# Patient Record
Sex: Female | Born: 1990 | Race: Black or African American | Hispanic: No | Marital: Single | Smoking: Former smoker
Health system: Southern US, Community
[De-identification: ages and names within clinical notes are randomized; demographics above are authoritative.]

## PROBLEM LIST (undated history)

## (undated) DIAGNOSIS — L309 Dermatitis, unspecified: Secondary | ICD-10-CM

## (undated) DIAGNOSIS — N200 Calculus of kidney: Secondary | ICD-10-CM

## (undated) DIAGNOSIS — T7840XA Allergy, unspecified, initial encounter: Secondary | ICD-10-CM

## (undated) DIAGNOSIS — B019 Varicella without complication: Secondary | ICD-10-CM

## (undated) HISTORY — DX: Calculus of kidney: N20.0

## (undated) HISTORY — DX: Dermatitis, unspecified: L30.9

## (undated) HISTORY — DX: Allergy, unspecified, initial encounter: T78.40XA

## (undated) HISTORY — DX: Varicella without complication: B01.9

## (undated) HISTORY — PX: OTHER SURGICAL HISTORY: SHX169

---
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
## 2010-03-24 ENCOUNTER — Ambulatory Visit: Payer: Self-pay | Admitting: Internal Medicine

## 2010-05-24 ENCOUNTER — Telehealth: Payer: Self-pay | Admitting: Internal Medicine

## 2010-06-26 ENCOUNTER — Telehealth (INDEPENDENT_AMBULATORY_CARE_PROVIDER_SITE_OTHER): Payer: Self-pay | Admitting: *Deleted

## 2010-06-27 ENCOUNTER — Telehealth: Payer: Self-pay | Admitting: Internal Medicine

## 2010-06-29 ENCOUNTER — Ambulatory Visit: Payer: Self-pay | Admitting: Internal Medicine

## 2010-09-27 ENCOUNTER — Ambulatory Visit
Admission: RE | Admit: 2010-09-27 | Discharge: 2010-09-27 | Payer: Self-pay | Source: Home / Self Care | Attending: Internal Medicine | Admitting: Internal Medicine

## 2010-09-27 ENCOUNTER — Encounter: Payer: Self-pay | Admitting: Internal Medicine

## 2010-09-29 NOTE — Assessment & Plan Note (Signed)
Summary: follow-up on prednisone//jrc   Primary Provider/Referring Provider:  [facility]/ [facility]  CC:  Recent Asthma attack-currently on Prednisone. Has had 6 attacks in the past week.Marland Kitchen  History of Present Illness: September 24, 2009 Eczema, asthma, allergic rhinitis....................................Marland Kitchenmother here In November had home CPR then admitted overnight with her asthma. Says today she is "fine" and hasn't missed much school this year with asthma. No flu shot. She recognizes some nasal stuffiness- like this a lot without any sense that she is catching a cold. Has no Epipen now. Using Qvar 80 one puff daily, nebulizer about 4 x/ week, rescue inhaler at least once or twice daily. last prednisone was at hosp in November.  March 24, 2010- Eczema, asthma, allergic rhinitis...................................sister here with baby Wheezes every day. Had a flare with a URI that moved into her chest last week. She stayed at home and toughed that out. She is back to baseline now. Daily use of her rescue inhaler. No recent prednisone. Eczema has been better controlled, using Clobetasol sparingly when needed. Not much sneeze or nasal congestion recently. Skin is doing better than usual for this time of year. We had stopped her allergy shots. She used Epipen only once long ago. IgE- 1669.8  June 29, 2010  Eczema, asthma, allergic rhinitis.. Nurse-CC: Recent Asthma attack-currently on Prednisone. Has had "6 attacks" in the past week. More frequent asthma flares over past month. Weather change and around sister's small child in day care, so may have caught a cold.  Today feels fine, having started prednisone taper 2 days ago- first prednisone this year. Denies  reflux problems. Prednisone also cleared her nasal congestion and drainage.  Now on prednisone feels this flare is coming under control. Eczema control much better in recent years. Discussed Xolair as an option- IgE level high. Discussed flu and  pneumovax.   Asthma History    Asthma Control Assessment:    Age range: 12+ years    Symptoms: >2 days/week    Nighttime Awakenings: 0-2/month    Interferes w/ normal activity: some limitations    SABA use (not for EIB): several times per day    Asthma Control Assessment: Very Poorly Controlled   Preventive Screening-Counseling & Management  Alcohol-Tobacco     Smoking Status: never  Current Medications (verified): 1)  Clobetasol Propionate 0.05 % Oint (Clobetasol Propionate) .... Thin Layer To Affected Area Two Times A Day 2)  Proventil Hfa 108 (90 Base) Mcg/act Aers (Albuterol Sulfate) .... Inhale 2 Puffs Every Four Hours As Needed 3)  Singulair 10 Mg Tabs (Montelukast Sodium) .Marland Kitchen.. 1 Daily 4)  Benadryl 25 Mg  Caps (Diphenhydramine Hcl) .... Take 1 By Mouth Once Daily As Needed 5)  Clarinex-D 12 Hour 2.5-120 Mg  Tb12 (Desloratadine-Pseudoephedrine) .... Take 1 By Mouth Once Daily As Needed 6)  Albuterol Sulfate (5 Mg/ml) 0.5%  Nebu (Albuterol Sulfate) .... Use Via Nebulizer As Directed 7)  Epipen 0.3 Mg/0.80ml (1:1000)  Devi (Epinephrine Hcl (Anaphylaxis)) .... Use As Directed When Needed 8)  Qvar 80 Mcg/act Aers (Beclomethasone Dipropionate) .Marland Kitchen.. 1 Puff Two Times A Day 9)  Flonase 50 Mcg/act Susp (Fluticasone Propionate) .... 2 Puffs Each Nostril Once Daily 10)  Prednisone 10 Mg Tabs (Prednisone) .... Take 2 Tabs Once Daily X 3 Days, 1 Tab Once Daily X 3 Days  Allergies (verified): 1)  ! Pcn 2)  * Peanut/tree Nut, Lima Bean  Past History:  Past Medical History: Last updated: 09/24/2009 Allergic Rhinitis Asthma- hosp 06/2009 eczema Kidney Stone -  right, ER 09/17/09  Past Surgical History: Last updated: 09/24/2008 dental extractions  Family History: Last updated: 09/24/2008 Mother - eczema and asthma Uncle- eczema and asthma  Social History: Last updated: 09/24/2008 2010- 12th grade single lives with mother  Risk Factors: Smoking Status: never  (06/29/2010)  Review of Systems      See HPI       The patient complains of shortness of breath with activity, non-productive cough, nasal congestion/difficulty breathing through nose, and rash.  The patient denies shortness of breath at rest, coughing up blood, chest pain, irregular heartbeats, acid heartburn, indigestion, loss of appetite, weight change, abdominal pain, difficulty swallowing, sore throat, tooth/dental problems, headaches, sneezing, itching, hand/feet swelling, change in color of mucus, and fever.    Vital Signs:  Patient profile:   20 year old female Height:      69 inches Weight:      219.25 pounds BMI:     32.49 O2 Sat:      96 % on Room air Pulse rate:   95 / minute BP sitting:   128 / 76  (left arm) Cuff size:   regular  Vitals Entered By: Reynaldo Minium CMA (June 29, 2010 2:11 PM)  O2 Flow:  Room air CC: Recent Asthma attack-currently on Prednisone. Has had 6 attacks in the past week.   Physical Exam  Additional Exam:  General: A/Ox3; pleasant and cooperative, NAD, AAF. Has gained some weight. SKIN: Old eczematoid changes on hands, less on face. Less inflammation than in past. NODES: no lymphadenopathy HEENT: [place]/AT, EOM- WNL, Conjuctivae- clear, PERRLA, TM-WNL, Nose- turbinate edema and mucus. No polyps, Sniffing., Throat- clear and wnl, Mallampati  III, tonsils NECK: Supple w/ fair ROM, JVD- none, normal carotid impulses w/o bruits Thyroid-  CHEST: Clear to P&A, very clear today HEART: RRR, no m/g/r heard ABDOMEN: Soft and nl;  URK:YHCW, nl pulses, no edema  NEURO: Grossly intact to observation      Impression & Recommendations:  Problem # 1:  ASTHMA (ICD-493.90) First flare needing prednisone this year. She was out of IgE range for Sharion Settler, but I will recheck that later. We discussed and will give flu and pneumovax. She is anticipating allergy testing in January.  Problem # 2:  ECZEMA (ICD-692.9)  Better control with residual scarring Her  updated medication list for this problem includes:    Clobetasol Propionate 0.05 % Oint (Clobetasol propionate) .Marland Kitchen... Thin layer to affected area two times a day    Benadryl 25 Mg Caps (Diphenhydramine hcl) .Marland Kitchen... Take 1 by mouth once daily as needed    Prednisone 10 Mg Tabs (Prednisone) .Marland Kitchen... 1 tab four times daily x 2 days, 3 times daily x 2 days, 2 times daily x 2 days, 1 time daily x 2 days  Problem # 3:  ALLERGIC RHINITIS (ICD-477.9)  Current prednisone is clearing her recent rhinitis.  Her updated medication list for this problem includes:    Benadryl 25 Mg Caps (Diphenhydramine hcl) .Marland Kitchen... Take 1 by mouth once daily as needed    Flonase 50 Mcg/act Susp (Fluticasone propionate) .Marland Kitchen... 2 puffs each nostril once daily  Medications Added to Medication List This Visit: 1)  Prednisone 10 Mg Tabs (Prednisone) .Marland Kitchen.. 1 tab four times daily x 2 days, 3 times daily x 2 days, 2 times daily x 2 days, 1 time daily x 2 days  Other Orders: Est. Patient Level IV (23762) Flu Vaccine 20yrs + (83151) Admin 1st Vaccine (76160) Pneumococcal Vaccine (73710) Admin of  Any Addtl Vaccine (26712)  Patient Instructions: 1)  Keep appointment in [place], planning allergy skin testing.  2)  Stop all antihistamines 3 days before skin testing, including cold and allergy meds, otc sleep and cough meds.  3)  Flu vax 4)  Pneumovax 5)  Finish your current prednisone taper then stay on Qvar. 6)  Script to hold for prednisone.  Prescriptions: PREDNISONE 10 MG TABS (PREDNISONE) 1 tab four times daily x 2 days, 3 times daily x 2 days, 2 times daily x 2 days, 1 time daily x 2 days  #20 x 1   Entered and Authorized by:   Waymon Budge MD   Signed by:   Waymon Budge MD on 06/29/2010   Method used:   Print then Give to Patient   RxID:   [id number]    Immunizations Administered:  Influenza Vaccine # 1:    Vaccine Type: Fluvax 3+    Site: left deltoid    Mfr: NOVARTIS    Dose: 0.5 ml    Route: IM    Given  by: Reynaldo Minium CMA    Exp. Date: 01/27/2011    Lot #: [id number]    VIS given: 03/22/10 version given June 29, 2010.  Pneumonia Vaccine:    Vaccine Type: Pneumovax    Site: right deltoid    Mfr: Merck    Dose: 0.5 ml    Route: IM    Given by: Reynaldo Minium CMA    Exp. Date: 11/14/2011    Lot #: [id number]    VIS given: 08/02/09 version given June 29, 2010.  Flu Vaccine Consent Questions:    Do you have a history of severe allergic reactions to this vaccine? no    Any prior history of allergic reactions to egg and/or gelatin? no    Do you have a sensitivity to the preservative Thimersol? no    Do you have a past history of Guillan-Barre Syndrome? no    Do you currently have an acute febrile illness? no    Have you ever had a severe reaction to latex? no    Vaccine information given and explained to patient? yes    Are you currently pregnant? no

## 2010-09-29 NOTE — Assessment & Plan Note (Signed)
Summary: 12 months/apc   Primary Provider/Referring Provider:  [facility]/ [facility]  CC:  Yearly follow up visit.  History of Present Illness: 12/08- HISTORY:  Last month, she got flu shot and, two days later, had significant flare of her eczema and also some hives on her legs.  She used clobetasol per existing directions with quick control and she is back on her regular regimen now.  Has had to use her asthma medications a little bit more aggressively, but feels this is under control.  Does not think she has any infection.  Mother continues her allergy shots.   09/24/08- Eczema, asthma, allergic rhinits.  not back to dermatologist in over a year. Had ER visit for outpt pneumonia- resolved. Stopped allergy shots a year ago. Doing ok without. Puts up with chronic eczema. Treats it with otc skin moisturizers. Used her nebulizer last week with the pneumonia, but not prior for over 6 months. Uses Ventolin 2-3 x/day. Qvar was addeed- she is calling back as to strength.Refill Epipen to cover against accidental peanut reaction.   September 24, 2009 Eczema, asthma, allergic rhinitis....................................Marland Kitchenmother here In November had home CPR then admitted overnight with her asthma. Says today she is "fine" and hasn't missed much school this year with asthma. No flu shot. She recognizes some nasal stuffiness- like this a lot without any sense that she is catching a cold. Has no Epipen now. Using Qvar 80 one puff daily, nebulizer about 4 x/ week, rescue inhaler at least once or twice daily. last prednisone was at hosp in November.    Current Medications (verified): 1)  Clobetasol Propionate 0.05 % Oint (Clobetasol Propionate) .... Thin Layer To Affected Area Two Times A Day 2)  Proventil Hfa 108 (90 Base) Mcg/act Aers (Albuterol Sulfate) .... Inhale 2 Puffs Every Four Hours As Needed 3)  Singulair 10 Mg Tabs (Montelukast Sodium) 4)  Benadryl 25 Mg  Caps (Diphenhydramine Hcl) .... Take 1 By  Mouth Once Daily As Needed 5)  Clarinex-D 12 Hour 2.5-120 Mg  Tb12 (Desloratadine-Pseudoephedrine) .... Take 1 By Mouth Once Daily As Needed 6)  Albuterol Sulfate (5 Mg/ml) 0.5%  Nebu (Albuterol Sulfate) .... Use Via Nebulizer As Directed 7)  Epipen 0.3 Mg/0.73ml (1:1000)  Devi (Epinephrine Hcl (Anaphylaxis)) .... Use As Directed When Needed 8)  Qvar 80 Mcg/act Aers (Beclomethasone Dipropionate) .Marland Kitchen.. 1 Puff Two Times A Day  Allergies (verified): 1)  ! Pcn 2)  * Peanut/tree Nut, Lima Bean  Past History:  Past Surgical History: Last updated: 09/24/2008 dental extractions  Family History: Last updated: 09/24/2008 Mother - eczema and asthma Uncle- eczema and asthma  Social History: Last updated: 09/24/2008 2010- 12th grade single lives with mother  Risk Factors: Smoking Status: never (07/29/2007)  Past Medical History: Allergic Rhinitis Asthma- hosp 06/2009 eczema Kidney Stone - right, ER 09/17/09  Review of Systems      See HPI  The patient denies anorexia, fever, weight loss, weight gain, vision loss, decreased hearing, hoarseness, chest pain, syncope, dyspnea on exertion, peripheral edema, prolonged cough, headaches, hemoptysis, abdominal pain, and severe indigestion/heartburn.    Vital Signs:  Patient profile:   20 year old female Height:      69 inches Weight:      222.25 pounds BMI:     32.94 O2 Sat:      97 % on Room air Pulse rate:   104 / minute BP sitting:   122 / 64  (left arm) Cuff size:   regular  Vitals Entered By:  Reynaldo Minium CMA (September 24, 2009 9:04 AM)  O2 Flow:  Room air  Physical Exam  Additional Exam:  General: A/Ox3; pleasant and cooperative, NAD, AAF SKIN: Severe eczematoid changes on hands, less on face NODES: no lymphadenopathy HEENT: [place]/AT, EOM- WNL, Conjuctivae- clear, PERRLA, TM-WNL, Nose- turbinate edema and mucus. No polyps.r, Throat- clear and wnl NECK: Supple w/ fair ROM, JVD- none, normal carotid impulses w/o bruits Thyroid-  normal to palpation CHEST: Clear to P&A, very clear today HEART: RRR, no m/g/r heard ABDOMEN: Soft and nl;  WGN:FAOZ, nl pulses, no edema  NEURO: Grossly intact to observation      Impression & Recommendations:  Problem # 1:  ASTHMA (ICD-493.90) Her exacerbation was preceeded by a week of progressive chest tightness and was probably viral triggered. i don't fully understand circumstances that would require "CPR" at home but require only a single overnight hospital stay. We reviewed meds today. I want more use of Qvar. We will check an IgE for possibility of Xolair candidacy.  Problem # 2:  ALLERGIC RHINITIS (ICD-477.9)  Active rhinitis- we wil resume a steroid inhaler.  Her updated medication list for this problem includes:    Benadryl 25 Mg Caps (Diphenhydramine hcl) .Marland Kitchen... Take 1 by mouth once daily as needed    Flonase 50 Mcg/act Susp (Fluticasone propionate) .Marland Kitchen... 2 puffs each nostril once daily  Problem # 3:  ECZEMA (ICD-692.9) We compared steroid potency between clobetasol and elocon. Her updated medication list for this problem includes:    Clobetasol Propionate 0.05 % Oint (Clobetasol propionate) .Marland Kitchen... Thin layer to affected area two times a day    Benadryl 25 Mg Caps (Diphenhydramine hcl) .Marland Kitchen... Take 1 by mouth once daily as needed  Orders: Est. Patient Level II (30865)  Medications Added to Medication List This Visit: 1)  Flonase 50 Mcg/act Susp (Fluticasone propionate) .... 2 puffs each nostril once daily  Other Orders: T-IgE (Immunoglobulin E) ([id number])  Patient Instructions: 1)  Please schedule a follow-up appointment in 6 months. 2)  Lab 3)  Meds refilled. Please use the Qvar 1 puff, twice daily 4)  Sample nasonex 2 sprays each nostril once daily, with script for Flonase/ fluticasone 5)  Flu vax Prescriptions: QVAR 80 MCG/ACT AERS (BECLOMETHASONE DIPROPIONATE) 1 puff two times a day  #1 x prn   Entered and Authorized by:   Waymon Budge MD   Signed by:    Waymon Budge MD on 09/24/2009   Method used:   Print then Give to Patient   RxID:   [id number] EPIPEN 0.3 MG/0.3ML (1:1000)  DEVI (EPINEPHRINE HCL (ANAPHYLAXIS)) Use as directed when needed  #1 x prn   Entered and Authorized by:   Waymon Budge MD   Signed by:   Waymon Budge MD on 09/24/2009   Method used:   Print then Give to Patient   RxID:   [id number] ALBUTEROL SULFATE (5 MG/ML) 0.5%  NEBU (ALBUTEROL SULFATE) Use via nebulizer as directed  #125 x prn   Entered and Authorized by:   Waymon Budge MD   Signed by:   Waymon Budge MD on 09/24/2009   Method used:   Print then Give to Patient   RxID:   [id number] CLOBETASOL PROPIONATE 0.05 % OINT (CLOBETASOL PROPIONATE) thin layer to affected area two times a day  #60.0 Gram x prn   Entered and Authorized by:   Waymon Budge MD   Signed by:   Waymon Budge MD on  09/24/2009   Method used:   Print then Give to Patient   RxID:   [id number] SINGULAIR 10 MG TABS (MONTELUKAST SODIUM)   #30 x prn   Entered and Authorized by:   Waymon Budge MD   Signed by:   Waymon Budge MD on 09/24/2009   Method used:   Print then Give to Patient   RxID:   [id number] PROVENTIL HFA 108 (90 BASE) MCG/ACT AERS (ALBUTEROL SULFATE) Inhale 2 puffs every four hours as needed  #6.7 Gram x prn   Entered and Authorized by:   Waymon Budge MD   Signed by:   Waymon Budge MD on 09/24/2009   Method used:   Print then Give to Patient   RxID:   [id number] FLONASE 50 MCG/ACT SUSP (FLUTICASONE PROPIONATE) 2 puffs each nostril once daily  #1 x prn   Entered and Authorized by:   Waymon Budge MD   Signed by:   Waymon Budge MD on 09/24/2009   Method used:   Print then Give to Patient   RxID:   [id number]

## 2010-09-29 NOTE — Progress Notes (Signed)
Summary: Phone- asthma flare  Phone Note Call from Patient   Caller: Mom Reason for Call: Acute Illness Action Taken: Phone Call Completed Summary of Call: She had asthma 'attack' this am resolvd with 6 puffs of rescue inhaler, did not need neb or epipen. c/o sinus congestion, no purulence Dr Maple Hudson 'normally calls in prednisone' Called in 20 mg , asked to call monday am  for appt Initial call taken by: Comer Locket. Vassie Loll MD,  June 26, 2010 5:47 PM  Follow-up for Phone Call        Katie- please check on her status.  Follow-up by: Waymon Budge MD,  June 27, 2010 12:25 PM  Additional Follow-up for Phone Call Additional follow up Details #1::        Spoke with pt's mother-states that pt is doing much better since starting the prednisone-appt has been made for Wednesday to assess if allergy shots are needed again. Pt's mother aware to call if worsens before then or go to [facility] or ER. [facility] has been notified of this.Reynaldo Minium CMA  June 27, 2010 1:43 PM     New/Updated Medications: PREDNISONE 10 MG TABS (PREDNISONE) take 2 tabs once daily x 3 days, 1 tab once daily x 3 days Prescriptions: PREDNISONE 10 MG TABS (PREDNISONE) take 2 tabs once daily x 3 days, 1 tab once daily x 3 days  #10 x 0   Entered and Authorized by:   Comer Locket Vassie Loll MD   Signed by:   Comer Locket Vassie Loll MD on 06/26/2010   Method used:   Electronically to        [facility]. [phone]* (retail)       [address]       [place], [place]  [address]       Ph: [phone]       Fax: [phone]   RxID:   [id number]

## 2010-09-29 NOTE — Progress Notes (Signed)
Summary: appt needed/ f/u asthma  Phone Note Call from Patient Call back at Home Phone [phone]   Caller: Mom-teresa Rattigan Call For: young Summary of Call: per mom- pt had an asthma attack over the weekend. was prescribed prednisone and told to call for f/u asap w/ dr young. 1st avail is in dec. NOTE: pt's mom also states that pt needs ALT as it has been a few yrs. since she has had one and pt is still having problems with allergies as well. I also asked mom has pt is today- she is "much better" re: asthma.  Initial call taken by: Tivis Ringer, CNA,  June 27, 2010 10:10 AM  Follow-up for Phone Call        pt called on call on Sunday 06-26-10 and was prescribed a pred taper and advised to follow-up this week with Dr. Maple Hudson. Pt is feeling better today per mother. Pt set to see CY on 06-29-10 at 2 pm.Jennifer [facility]  June 27, 2010 11:33 AM

## 2010-09-29 NOTE — Progress Notes (Signed)
Summary: nos appt  Phone Note Call from Patient   Caller: [email or web address]  Call For: Keilynn Marano Summary of Call: Rsc nos from 9/26 to 11/3 @ 3:45p. Initial call taken by: Darletta Moll,  May 24, 2010 4:44 PM

## 2010-09-29 NOTE — Progress Notes (Signed)
Summary: rx request     Phone Note Call from Patient   Caller: Mom Call For: Hollye Pritt Summary of Call: pt has appt 1/28 w/ cy. mom says that pt is out of albuterol sulfate. can this be called in for pt in meantime? [facility] on [address] ([address]) call mom at [phone] Initial call taken by: Tivis Ringer,  September 14, 2009 10:24 AM  Follow-up for Phone Call        [facility].   Does she want HFA or Nebs?  Aundra Millet Reynolds LPN  September 14, 2009 10:40 AM   rx filled for proventil inhaler. Carron Curie CMA  September 15, 2009 9:38 AM

## 2010-09-29 NOTE — Assessment & Plan Note (Signed)
Summary: 6 months/apc   Primary Provider/Referring Provider:  [facility]/ [facility]  CC:  follow up visit-asthma and allergies..  History of Present Illness: HISTORY:  Last month, she got flu shot and, two days later, had significant flare of her eczema and also some hives on her legs.  She used clobetasol per existing directions with quick control and she is back on her regular regimen now.  Has had to use her asthma medications a little bit more aggressively, but feels this is under control.  Does not think she has any infection.  Mother continues her allergy shots.   09/24/08- Eczema, asthma, allergic rhinits.  not back to dermatologist in over a year. Had ER visit for outpt pneumonia- resolved. Stopped allergy shots a year ago. Doing ok without. Puts up with chronic eczema. Treats it with otc skin moisturizers. Used her nebulizer last week with the pneumonia, but not prior for over 6 months. Uses Ventolin 2-3 x/day. Qvar was addeed- she is calling back as to strength.Refill Epipen to cover against accidental peanut reaction.  September 24, 2009 Eczema, asthma, allergic rhinitis....................................[name] Kitchenmother here In November had home CPR then admitted overnight with her asthma. Says today she is "fine" and hasn't missed much school this year with asthma. No flu shot. She recognizes some nasal stuffiness- like this a lot without any sense that she is catching a cold. Has no Epipen now. Using Qvar 80 one puff daily, nebulizer about 4 x/ week, rescue inhaler at least once or twice daily. last prednisone was at hosp in November.  March 24, 2010- Eczema, asthma, allergic rhinitis...................................sister here with baby Wheezes every day. Had a flare with a URI that moved into her chest last week. She stayed at home and toughed that out. She is back to baseline now. Daily use of her rescue inhaler. No recent prednisone. Eczema has been better controlled, using Clobetasol  sparingly when needed. Not much sneeze or nasal congestion recently. Skin is doing better than usual for this time of year. We had stopped her allergy shots. She used Epipen only once long ago. IgE-     Asthma History    Initial Asthma Severity Rating:    Age range: 20 years    Symptoms: daily    Nighttime Awakenings: 0-2/month    Interferes w/ normal activity: no limitations    SABA use (not for EIB): several times per day    Asthma Severity Assessment: Severe Persistent   Preventive Screening-Counseling & Management  Alcohol-Tobacco     Smoking Status: never  Current Medications (verified): 1)  Clobetasol Propionate 0.05 % Oint (Clobetasol Propionate) .... Thin Layer To Affected Area Two Times A Day 2)  Proventil Hfa 108 (90 Base) Mcg/act Aers (Albuterol Sulfate) .... Inhale 2 Puffs Every Four Hours As Needed 3)  Singulair 10 Mg Tabs (Montelukast Sodium) 4)  Benadryl 25 Mg  Caps (Diphenhydramine Hcl) .... Take 1 By Mouth Once Daily As Needed 5)  Clarinex-D 12 Hour 2.5-120 Mg  Tb12 (Desloratadine-Pseudoephedrine) .... Take 1 By Mouth Once Daily As Needed 6)  Albuterol Sulfate (5 Mg/ml) 0.5%  Nebu (Albuterol Sulfate) .... Use Via Nebulizer As Directed 7)  Epipen 0.3 Mg/0.2ml (1:1000)  Devi (Epinephrine Hcl (Anaphylaxis)) .... Use As Directed When Needed 8)  Qvar 80 Mcg/act Aers (Beclomethasone Dipropionate) .[name] [name].. 1 Puff Two Times A Day 9)  Flonase 50 Mcg/act Susp (Fluticasone Propionate) .... 2 Puffs Each Nostril Once Daily  Allergies (verified): 1)  ! Pcn 2)  * Peanut/tree Nut, Lima Bean  Past History:  Past Medical History: Last updated: 09/24/2009 Allergic Rhinitis Asthma- hosp 06/2009 eczema Kidney Stone - right, ER 09/17/09  Past Surgical History: Last updated: 09/24/2008 dental extractions  Family History: Last updated: 09/24/2008 Mother - eczema and asthma Uncle- eczema and asthma  Social History: Last updated: 09/24/2008 2010- 12th grade single lives  with mother  Risk Factors: Smoking Status: never (03/24/2010)  Review of Systems      See HPI       The patient complains of nasal congestion/difficulty breathing through nose, sneezing, and rash.  The patient denies shortness of breath with activity, shortness of breath at rest, productive cough, non-productive cough, coughing up blood, chest pain, irregular heartbeats, acid heartburn, indigestion, loss of appetite, weight change, abdominal pain, difficulty swallowing, sore throat, tooth/dental problems, headaches, itching, and ear ache.    Vital Signs:  Patient profile:   20 year old female Height:      609 inches Weight:      218.25 pounds BMI:     0.42 O2 Sat:      98 % on Room air Pulse rate:   75 / minute BP sitting:   144 / 88  (left arm) Cuff size:   regular  Vitals Entered By: Reynaldo Minium CMA (March 24, 2010 9:12 AM)  O2 Flow:  Room air CC: follow up visit-asthma and allergies.   Physical Exam  Additional Exam:  General: A/Ox3; pleasant and cooperative, NAD, AAF. Has gained some weight. SKIN: Old eczematoid changes on hands, less on face. Less inflammation than in past. NODES: no lymphadenopathy HEENT: [place]/AT, EOM- WNL, Conjuctivae- clear, PERRLA, TM-WNL, Nose- turbinate edema and mucus. No polyps., Throat- clear and wnl NECK: Supple w/ fair ROM, JVD- none, normal carotid impulses w/o bruits Thyroid-  CHEST: Clear to P&A, very clear today HEART: RRR, no m/g/r heard ABDOMEN: Soft and nl;  WUJ:WJXB, nl pulses, no edema  NEURO: Grossly intact to observation      Impression & Recommendations:  Problem # 1:  ASTHMA (ICD-493.90) Daily wheeze and daily use of rescue inhaler. High IgE  She is compliant with daily Qvar maintenance steroid inhaler. She is interested in exploring Xolair if she could be qualified.  Problem # 2:  ALLERGIC RHINITIS (ICD-477.9) Regular use of Flonase is well tolerated and seems to keep her controlled. Her updated medication list for this  problem includes:    Benadryl 25 Mg Caps (Diphenhydramine hcl) .[name] [name]... Take 1 by mouth once daily as needed    Flonase 50 Mcg/act Susp (Fluticasone propionate) .[name] [name]... 2 puffs each nostril once daily  Problem # 3:  ECZEMA (ICD-692.9) She understands to be careful with clobetasol, avoiding overuse and sensitive skin areas. Refill med Her updated medication list for this problem includes:    Clobetasol Propionate 0.05 % Oint (Clobetasol propionate) .[name] [name]... Thin layer to affected area two times a day    Benadryl 25 Mg Caps (Diphenhydramine hcl) .[name] [name]... Take 1 by mouth once daily as needed  Medications Added to Medication List This Visit: 1)  Singulair 10 Mg Tabs (Montelukast sodium) .[name] [name].. 1 daily  Other Orders: Est. Patient Level III (14782)  Patient Instructions: 1)  Please schedule a follow-up appointment in 2 months. 2)  Info sheet on Xolair 3)  Schedule PFT 4)  Med refills sent to [facility] Prescriptions: SINGULAIR 10 MG TABS (MONTELUKAST SODIUM) 1 daily  #30 x prn   Entered and Authorized by:   Waymon Budge MD   Signed by:   Rennis Chris  Cherri Yera MD on 03/24/2010   Method used:   Electronically to        [facility] [phone]* (retail)       [address]       [place], [place]  [address]       Ph: [phone]       Fax: [phone]   RxID:   [id number] FLONASE 50 MCG/ACT SUSP (FLUTICASONE PROPIONATE) 2 puffs each nostril once daily  #1 x prn   Entered and Authorized by:   Waymon Budge MD   Signed by:   Waymon Budge MD on 03/24/2010   Method used:   Electronically to        [facility] [phone]* (retail)       [address]       [place], [place]  [address]       Ph: [phone]       Fax: [phone]   RxID:   [id number] QVAR 80 MCG/ACT AERS (BECLOMETHASONE DIPROPIONATE) 1 puff two times a day  #1 x prn   Entered and Authorized by:   Waymon Budge MD   Signed by:   Waymon Budge MD on  03/24/2010   Method used:   Electronically to        [facility] [phone]* (retail)       [address]       [place], [place]  [address]       Ph: [phone]       Fax: [phone]   RxID:   [id number] ALBUTEROL SULFATE (5 MG/ML) 0.5%  NEBU (ALBUTEROL SULFATE) Use via nebulizer as directed  #125 x prn   Entered and Authorized by:   Waymon Budge MD   Signed by:   Waymon Budge MD on 03/24/2010   Method used:   Electronically to        [facility] [phone]* (retail)       [address]       [place], [place]  [address]       Ph: [phone]       Fax: [phone]   RxID:   [id number] PROVENTIL HFA 108 (90 BASE) MCG/ACT AERS (ALBUTEROL SULFATE) Inhale 2 puffs every four hours as needed  #1 x prn   Entered and Authorized by:   Waymon Budge MD   Signed by:   Waymon Budge MD on 03/24/2010   Method used:   Electronically to        [facility] [phone]* (retail)       [address]       [place], [place]  [address]       Ph: [phone]       Fax: [phone]   RxID:   [id number] CLOBETASOL PROPIONATE 0.05 % OINT (CLOBETASOL PROPIONATE) thin layer to affected area two times a day  #60.0 Gram x prn   Entered and Authorized by:   Waymon Budge MD   Signed by:   Waymon Budge MD on 03/24/2010   Method used:   Electronically to        [facility] [phone]* (retail)       [phone]  [address]       [place], [place]  [address]       Ph: [phone]       Fax: [phone]   RxID:   [id number] EPIPEN 0.3 MG/0.3ML (1:1000)  DEVI (EPINEPHRINE HCL (ANAPHYLAXIS)) Use as directed when needed  #1 x prn   Entered and Authorized by:   Waymon Budge MD   Signed by:   Waymon Budge MD on 03/24/2010   Method used:   Electronically to        [facility] [phone]* (retail)       [address]       [place], [place]  [address]       Ph: [phone]       Fax: [phone]   RxID:   [id number]

## 2010-10-05 ENCOUNTER — Telehealth (INDEPENDENT_AMBULATORY_CARE_PROVIDER_SITE_OTHER): Payer: Self-pay | Admitting: *Deleted

## 2010-10-05 NOTE — Assessment & Plan Note (Signed)
Summary: allergy skin testing per kw //kp   Vital Signs:  Patient profile:   20 year old female Height:      69 inches Weight:      224 pounds BMI:     33.20 O2 Sat:      99 % on Room air Pulse rate:   71 / minute BP sitting:   116 / 78  (left arm) Cuff size:   large  Vitals Entered By: Reynaldo Minium CMA (September 27, 2010 2:06 PM)  O2 Flow:  Room air CC: Allergy Skin Testing   Primary Provider/Referring Provider:  [facility]/ [facility]  CC:  Allergy Skin Testing.  History of Present Illness: June 29, 2010  Eczema, asthma, allergic rhinitis.. Nurse-CC: Recent Asthma attack-currently on Prednisone. Has had "6 attacks" in the past week. More frequent asthma flares over past month. Weather change and around sister's small child in day care, so may have caught a cold.  Today feels fine, having started prednisone taper 2 days ago- first prednisone this year. Denies  reflux problems. Prednisone also cleared her nasal congestion and drainage.  Now on prednisone feels this flare is coming under control. Eczema control much better in recent years. Discussed Xolair as an option- IgE level high. Discussed flu and pneumovax.   September 27, 2010-  Eczema, asthma, allergic rhinitis..   friend  here Nurse-CC: Allergy Skin Testing Took claritin last night against instruction because of increased nose itch, eyes and nose watering and itching.  Had frontal headache yesterday, now gone. Cough but little wheeze. Denies sore, throat fever, purulence. Skin/ eczema not bad.  Skin test- Positive Histamine control so we went ahead as planned. There were clearly positive specific reactions to grass, weed, tree and endodermal inhalants.    Asthma History    Asthma Control Assessment:    Age range: 12+ years    Symptoms: 0-2 days/week    Nighttime Awakenings: 0-2/month    Interferes w/ normal activity: no limitations    SABA use (not for EIB): 0-2 days/week    Asthma Control Assessment: Well  Controlled   Preventive Screening-Counseling & Management  Alcohol-Tobacco     Smoking Status: never  Current Medications (verified): 1)  Clobetasol Propionate 0.05 % Oint (Clobetasol Propionate) .... Thin Layer To Affected Area Two Times A Day 2)  Proventil Hfa 108 (90 Base) Mcg/act Aers (Albuterol Sulfate) .... Inhale 2 Puffs Every Four Hours As Needed 3)  Singulair 10 Mg Tabs (Montelukast Sodium) .Marland Kitchen.. 1 Daily 4)  Benadryl 25 Mg  Caps (Diphenhydramine Hcl) .... Take 1 By Mouth Once Daily As Needed 5)  Clarinex-D 12 Hour 2.5-120 Mg  Tb12 (Desloratadine-Pseudoephedrine) .... Take 1 By Mouth Once Daily As Needed 6)  Albuterol Sulfate (5 Mg/ml) 0.5%  Nebu (Albuterol Sulfate) .... Use Via Nebulizer As Directed 7)  Epipen 0.3 Mg/0.15ml (1:1000)  Devi (Epinephrine Hcl (Anaphylaxis)) .... Use As Directed When Needed 8)  Qvar 80 Mcg/act Aers (Beclomethasone Dipropionate) .Marland Kitchen.. 1 Puff Two Times A Day 9)  Flonase 50 Mcg/act Susp (Fluticasone Propionate) .... 2 Puffs Each Nostril Once Daily  Allergies (verified): 1)  ! Pcn 2)  * Peanut/tree Nut, Lima Bean  Past History:  Past Medical History: Last updated: 09/24/2009 Allergic Rhinitis Asthma- hosp 06/2009 eczema Kidney Stone - right, ER 09/17/09  Past Surgical History: Last updated: 09/24/2008 dental extractions  Family History: Last updated: 09/24/2008 Mother - eczema and asthma Uncle- eczema and asthma  Social History: Last updated: 09/24/2008 2010- 12th grade single lives  with mother  Risk Factors: Smoking Status: never (09/27/2010)  Review of Systems      See HPI       The patient complains of non-productive cough, nasal congestion/difficulty breathing through nose, sneezing, and rash.  The patient denies shortness of breath with activity, shortness of breath at rest, productive cough, coughing up blood, chest pain, irregular heartbeats, acid heartburn, indigestion, loss of appetite, weight change, abdominal pain,  difficulty swallowing, sore throat, tooth/dental problems, headaches, ear ache, hand/feet swelling, joint stiffness or pain, and change in color of mucus.    Physical Exam  Additional Exam:  General: A/Ox3; pleasant and cooperative, NAD, AAF. SKIN: Old eczematoid changes on hands, less on face. Significant scarring on arms/ elbows.  [place] NODES: no lymphadenopathy HEENT: [place]/AT, EOM- WNL, Conjuctivae- clear, PERRLA, TM-WNL, Nose- turbinate edema and mucus. No polyps, Sniffing., Throat- clear and wnl, Mallampati  III, tonsils NECK: Supple w/ fair ROM, JVD- none, normal carotid impulses w/o bruits Thyroid-  CHEST: Clear to P&A, no cough or wheeze HEART: RRR, no m/g/r heard ABDOMEN: Soft and nl;  ZOX:WRUE, nl pulses, no edema  NEURO: Grossly intact to observation      Impression & Recommendations:  Problem # 1:  ALLERGIC RHINITIS (ICD-477.9)  She would be a reasonable candidate for allergy shots as discussed and will talk with her mother about it. I would like to be able to offer her Xolair, but her IgE is high for now. We are refilling meds and will see her back in mid summer.  Her updated medication list for this problem includes:    Benadryl 25 Mg Caps (Diphenhydramine hcl) .Marland Kitchen... Take 1 by mouth once daily as needed    Flonase 50 Mcg/act Susp (Fluticasone propionate) .Marland Kitchen... 2 puffs each nostril once daily  Orders: Est. Patient Level III (45409) Allergy Puncture Test (81191) Allergy I.D Test (47829)  Problem # 2:  ASTHMA (ICD-493.90) Fairly good control now. We will watch as season changes. She knows her meds.  Problem # 3:  ECZEMA (ICD-692.9)  Severe residual scarring and some old excoriated areas, but little that looks active now.  The following medications were removed from the medication list:    Prednisone 10 Mg Tabs (Prednisone) .Marland Kitchen... 1 tab four times daily x 2 days, 3 times daily x 2 days, 2 times daily x 2 days, 1 time daily x 2 days Her updated medication list for this  problem includes:    Clobetasol Propionate 0.05 % Oint (Clobetasol propionate) .Marland Kitchen... Thin layer to affected area two times a day    Benadryl 25 Mg Caps (Diphenhydramine hcl) .Marland Kitchen... Take 1 by mouth once daily as needed  Patient Instructions: 1)  Please schedule a follow-up appointment in 4 months. 2)  Meds refilled 3)  Please talk with your mother about restarting allergy shots here. You would come here twice a week for 4 months, gradually building. After 4 months your shots would be once a week. 4)  We would need to give your shots here- they no longer recommend having people get them at home.  Prescriptions: FLONASE 50 MCG/ACT SUSP (FLUTICASONE PROPIONATE) 2 puffs each nostril once daily  #1 x prn   Entered and Authorized by:   Waymon Budge MD   Signed by:   Waymon Budge MD on 09/27/2010   Method used:   Print then Give to Patient   RxID:   [id number] QVAR 80 MCG/ACT AERS (BECLOMETHASONE DIPROPIONATE) 1 puff two times a day  #1 x  prn   Entered and Authorized by:   Waymon Budge MD   Signed by:   Waymon Budge MD on 09/27/2010   Method used:   Print then Give to Patient   RxID:   [id number] SINGULAIR 10 MG TABS (MONTELUKAST SODIUM) 1 daily  #30 x prn   Entered and Authorized by:   Waymon Budge MD   Signed by:   Waymon Budge MD on 09/27/2010   Method used:   Print then Give to Patient   RxID:   [id number] PROVENTIL HFA 108 (90 BASE) MCG/ACT AERS (ALBUTEROL SULFATE) Inhale 2 puffs every four hours as needed  #1 x prn   Entered and Authorized by:   Waymon Budge MD   Signed by:   Waymon Budge MD on 09/27/2010   Method used:   Print then Give to Patient   RxID:   [id number] CLOBETASOL PROPIONATE 0.05 % OINT (CLOBETASOL PROPIONATE) thin layer to affected area two times a day  #60.0 Gram x prn   Entered and Authorized by:   Waymon Budge MD   Signed by:   Waymon Budge MD on 09/27/2010   Method used:   Print then Give to Patient   RxID:    [id number]    Orders Added: 1)  Est. Patient Level III [42595] 2)  Allergy Puncture Test [95004] 3)  Allergy I.D Test [63875]

## 2010-10-13 NOTE — Miscellaneous (Signed)
Summary: Intradermal tests/[facility]  Intradermal tests/[facility]   Imported By: Lester [place] 10/03/2010 10:46:09  _____________________________________________________________________  External Attachment:    Type:   Image     Comment:   External Document

## 2010-10-13 NOTE — Progress Notes (Signed)
Summary: two meds not covered by pts insurance---will call back  Phone Note Call from Patient Call back at Home Phone [phone]   Caller: mother/teresa Call For: young Summary of Call: Patients mother phoned stated that when Dr Maple Hudson saw the patient he wrote her prescriptions for one year and two of these were not covered by their insurance, Proventil and Terrence Dupont she stated that the nurse told her to advise our office which two and Dr Mare Loan prescribe a different medication. She can be reached at [phone] Initial call taken by: Vedia Coffer,  October 05, 2010 3:01 PM  Follow-up for Phone Call        called and spoke with pt's mother, Rosey Bath.  Rosey Bath states pt's insurance will no longer cover Qvar or Proventil or Ventolin and therefore needs something different.  Rosey Bath states Advair is on pt's formulary.  I asked mother if Rennis Golden was on formulary and mother stated she would have to check and will call me back.  Aundra Millet Reynolds LPN  October 05, 2010 4:03 PM   Returning Megan's call from previous conversation.Darletta Moll  October 05, 2010 4:54 PM   Additional Follow-up for Phone Call Additional follow up Details #1::        Advair and proair hfa on formulary; can we change to this for patient.Reynaldo Minium CMA  October 05, 2010 5:08 PM     Additional Follow-up for Phone Call Additional follow up Details #2::    I changed med list - Proventil rescue inhaler changed to Proair Qvar maintenance steroid inhaler changed to Advair for daily prevention Please let her know.  Follow-up by: Waymon Budge MD,  October 06, 2010 12:11 PM  Additional Follow-up for Phone Call Additional follow up Details #3:: Details for Additional Follow-up Action Taken: Spoke with patients mother and requested we send Rx to [facility].Reynaldo Minium CMA  October 07, 2010 9:09 AM    Done.Reynaldo Minium CMA  October 07, 2010 9:09 AM   New/Updated Medications: PROAIR HFA 108 (90  BASE) MCG/ACT AERS (ALBUTEROL SULFATE) 2 puffs four times a day as needed rescue inhaler ADVAIR DISKUS 100-50 MCG/DOSE AEPB (FLUTICASONE-SALMETEROL) 1 puff and rinse mouth, twice daily Prescriptions: ADVAIR DISKUS 100-50 MCG/DOSE AEPB (FLUTICASONE-SALMETEROL) 1 puff and rinse mouth, twice daily  #1 x prn   Entered by:   Reynaldo Minium CMA   Authorized by:   Waymon Budge MD   Signed by:   Reynaldo Minium CMA on 10/07/2010   Method used:   Electronically to        [facility]* (retail)       [address]       [place], [place]  [address]       Ph: [phone]       Fax: [phone]   RxID:   [id number] PROAIR HFA 108 (90 BASE) MCG/ACT AERS (ALBUTEROL SULFATE) 2 puffs four times a day as needed rescue inhaler  #1 x prn   Entered by:   Reynaldo Minium CMA   Authorized by:   Waymon Budge MD   Signed by:   Reynaldo Minium CMA on 10/07/2010   Method used:   Electronically to        [facility]* (retail)       [address]       [place],  [place]  [address]       Ph: [phone]       Fax: [phone]   RxID:   [id number] ADVAIR DISKUS 100-50 MCG/DOSE AEPB (FLUTICASONE-SALMETEROL) 1 puff and rinse mouth, twice daily  #1 x prn   Entered and Authorized by:   Waymon Budge MD   Signed by:   Waymon Budge MD on 10/06/2010   Method used:   Historical   RxID:   [id number] PROAIR HFA 108 (90 BASE) MCG/ACT AERS (ALBUTEROL SULFATE) 2 puffs four times a day as needed rescue inhaler  #1 x prn   Entered and Authorized by:   Waymon Budge MD   Signed by:   Waymon Budge MD on 10/06/2010   Method used:   Historical   RxID:   [id number]

## 2010-11-14 LAB — COMPREHENSIVE METABOLIC PANEL
ALT: 24 U/L (ref 0–35)
AST: 27 U/L (ref 0–37)
Alkaline Phosphatase: 65 U/L (ref 39–117)
CO2: 28 mEq/L (ref 19–32)
Chloride: 101 mEq/L (ref 96–112)
GFR calc Af Amer: >60 mL/min (ref >60)
GFR calc non Af Amer: >60 mL/min (ref >60)
Potassium: 4.1 mEq/L (ref 3.5–5.1)
Sodium: 137 mEq/L (ref 135–145)
Total Bilirubin: 0.4 mg/dL (ref 0.3–1.2)

## 2010-11-14 LAB — DIFFERENTIAL
Basophils Absolute: 0.1 10*3/uL (ref 0.0–0.1)
Eosinophils Absolute: 0.9 10*3/uL — ABNORMAL HIGH (ref 0.0–0.7)
Eosinophils Relative: 10 % — ABNORMAL HIGH (ref 0–5)
Lymphs Abs: 1.5 10*3/uL (ref 0.7–4.0)

## 2010-11-14 LAB — URINE MICROSCOPIC-ADD ON

## 2010-11-14 LAB — URINALYSIS, ROUTINE W REFLEX MICROSCOPIC
Bilirubin Urine: NEGATIVE
Glucose, UA: NEGATIVE mg/dL
Ketones, ur: NEGATIVE mg/dL
Protein, ur: 30 mg/dL — AB
pH: 7.5 (ref 5.0–8.0)

## 2010-11-14 LAB — CBC
RBC: 4.67 MIL/uL (ref 3.87–5.11)
WBC: 8.9 10*3/uL (ref 4.0–10.5)

## 2010-11-30 LAB — URINE MICROSCOPIC-ADD ON

## 2010-11-30 LAB — URINALYSIS, ROUTINE W REFLEX MICROSCOPIC
Glucose, UA: NEGATIVE mg/dL
Ketones, ur: NEGATIVE mg/dL
Leukocytes, UA: NEGATIVE
Specific Gravity, Urine: 1.02 (ref 1.005–1.030)
pH: 6 (ref 5.0–8.0)

## 2010-11-30 LAB — DIFFERENTIAL
Eosinophils Relative: 14 % — ABNORMAL HIGH (ref 0–5)
Lymphocytes Relative: 14 % (ref 12–46)
Lymphs Abs: 1.3 10*3/uL (ref 0.7–4.0)
Monocytes Absolute: 0.5 10*3/uL (ref 0.1–1.0)
Neutro Abs: 6.6 10*3/uL (ref 1.7–7.7)

## 2010-11-30 LAB — RAPID URINE DRUG SCREEN, HOSP PERFORMED
Opiates: NOT DETECTED
Tetrahydrocannabinol: NOT DETECTED

## 2010-11-30 LAB — BASIC METABOLIC PANEL
BUN: 10 mg/dL (ref 6–23)
CO2: 24 mEq/L (ref 19–32)
Calcium: 9.1 mg/dL (ref 8.4–10.5)
Chloride: 106 mEq/L (ref 96–112)
Creatinine, Ser: 0.99 mg/dL (ref 0.4–1.2)
GFR calc Af Amer: >60 mL/min (ref >60)
GFR calc non Af Amer: >60 mL/min (ref >60)
Glucose, Bld: 94 mg/dL (ref 70–99)
Potassium: 3.4 mEq/L — ABNORMAL LOW (ref 3.5–5.1)
Sodium: 140 mEq/L (ref 135–145)

## 2010-11-30 LAB — CBC
HCT: 39.8 % (ref 36.0–46.0)
Hemoglobin: 13.3 g/dL (ref 12.0–15.0)
RBC: 4.54 MIL/uL (ref 3.87–5.11)
WBC: 9.9 10*3/uL (ref 4.0–10.5)

## 2010-11-30 LAB — POCT CARDIAC MARKERS
CKMB, poc: 1.6 ng/mL (ref 1.0–8.0)
Myoglobin, poc: >500 ng/mL (ref 12–200)
Troponin i, poc: <0.05 ng/mL (ref 0.00–0.09)

## 2010-11-30 LAB — ETHANOL: Alcohol, Ethyl (B): <5 mg/dL (ref 0–10)

## 2011-01-10 NOTE — Assessment & Plan Note (Signed)
[facility]                             PULMONARY OFFICE NOTE   NAME:[name], [name] AVALOS                   MRN:          [id number]  DATE:07/30/2007                            DOB:          1991-03-20    PROBLEM:  1. Allergic rhinitis.  2. Asthma.  3. Eczema.  4. PEANUT ALLERGY.   HISTORY:  Last month, she got flu shot and, two days later, had  significant flare of her eczema and also some hives on her legs.  She  used clobetasol per existing directions with quick control and she is  back on her regular regimen now.  Has had to use her asthma medications  a little bit more aggressively, but feels this is under control.  Does  not think she has any infection.  Mother continues her allergy shots.   MEDICATIONS:  1. Advair 250/50.  2. Singulair 10 mg.  3. Benadryl at h.s. p.r.n. or Clarinex.  4. Clobetasol ointment 0.05% used p.r.n.  5. Allergy vaccine.  6. Albuterol rescue inhaler.  7. She has an Epi-Pen.  8. She has a home nebulizer with albuterol.   DRUG INTOLERANCE:  Noted apparent eczematoid reaction after flu vaccine  this year.  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   IMPRESSION:  1. Eczema with flare.  2. Asthma.  3. Allergic rhinitis.   PLAN:  Meds were discussed and refilled.  Try Sudafed PE.  She will  continue with Dr. [place] for her eczema management.  Schedule return in  six months, earlier p.r.n.  Question need for PFT.     Clinton D. Maple Hudson, MD, [name], FACP  Electronically Signed    CDY/MedQ  DD: 07/30/2007  DT: 07/31/2007  Job #: [id number]   cc:   Harrel Lemon. Merla Riches, M.D.  Amy Y. [place], M.D.

## 2011-01-10 NOTE — Assessment & Plan Note (Signed)
[facility]                             PULMONARY OFFICE NOTE   NAME:[name], [name] ABASCAL                   MRN:          [id number]  DATE:01/29/2007                            DOB:          [birth year]    ALLERGY FOLLOW-UP:   PROBLEMS:  1. Allergic rhinitis.  2. Asthma.  3. Eczema.  4. PEANUT ALLERGY.   HISTORY:  They are changing primary care from her former pediatrician to  Dr. Merla Riches at [facility].  Asthma flared 3 or 4  weeks ago, partly when she ran out of Advair.  They have a valid  prescription and have restarted.  She never needed a prednisone taper  that I gave in December and feels her breathing is doing well now.  Skin  is about the same, not really affected by either the pollen season or  the warmer weather.  We discussed her skin care again.  I have suggested  mother arrange a local follow-up with Dr. Swaziland.  We discussed her  allergy vaccine again.  Strength is currently at 1:50 with injections  given by mother.  They have an EpiPen and have had no problem with  reactions to her shots.  We again reviewed the understanding that some  people seem to experience worsening of eczema with allergy vaccine.  Her  shots were directed at her asthma and allergic rhinitis, which have done  fairly well this spring.  I was at an asthma and allergy conference this  week.  The point was made that there are individuals whose eczema and  atopic dermatitis seem to improve on allergy vaccine.  I told the mother  that her skin problems should not be considered a reason to be on  allergy shots, which should be helpful for other problems or  discontinued.  We are comfortable to continue for now, advancing dose to  1:10 with next order as discussed.  I have suggested that they talk  again with Dr. Swaziland about whether a trial of a maintenance antibiotic  against skin bacteria should be considered.  We discussed some theories  about  eczema and available therapies.   MEDICATIONS:  1. Advair 250/50 mcg.  2. Singulair 10 mg.  3. Benadryl or Clarinex.  4. Clobetasol.  5. The p.r.n. use of an albuterol inhaler and home nebulizer with      albuterol.  6. EpiPen.   ALLERGIES:  PEANUT and TREE NUT with anaphylaxis, LIMA BEAN.   OBJECTIVE:  VITAL SIGNS:  Weight 202 pounds.  BP 106/60, pulse 75.  Room  air saturation 96%.  GENERAL:  She is overweight.  She seems calm and comfortable.  SKIN:  Obvious areas of hyperkeratosis on extensor surfaces, especially  knees and arms.  There are old excoriation scars with hypopigmentation  but no active excoriation seen.  CHEST:  Lung sounds clear.  CARDIAC:  Heart sounds are regular without murmur.   IMPRESSION:  1. Asthma.  2. Allergic rhinitis.  3. Severe chronic eczema.   PLAN:  1. Advance her allergy vaccine to 1:10 with next order.  2. Follow  up with Dr. Swaziland for dermatology.  3. Schedule return in 6 months, earlier p.r.n.     Clinton D. Maple Hudson, MD, [name], FACP  Electronically Signed    CDY/MedQ  DD: 01/29/2007  DT: 01/30/2007  Job #: [id number]   cc:   Harrel Lemon. Merla Riches, M.D.  Amy Y. Swaziland, M.D.

## 2011-01-13 NOTE — Assessment & Plan Note (Signed)
[facility]                             PULMONARY OFFICE NOTE   NAME:Grassi, SHERESE HEYWARD                   MRN:          [id number]  DATE:08/01/2006                            DOB:          [birth year]    PROBLEMS:  1. Allergic rhinitis.  2. Asthma.  3. Eczema.  4. PEANUT allergy.   HISTORY:  She has successfully built to maintenance on allergy vaccine  with injections given by her mother, and no problems at all with her  shots. Vaccine is aimed at her allergic rhinitis component. They  understand we do not expect standard allergy vaccine to have much impact  on eczema. She is careful to avoid PEANUT exposure, and there have been  no problems with that. Her eczema is now being managed by Dr. Amy  Swaziland. Asthma has been well controlled, with no recent flares.   MEDICATIONS:  1. Advair 250/50.  2. Singulair 10 mg.  3. Benadryl nightly p.r.n.  4. Clarinex p.r.n.  5. Clobetasol for skin.  6. Albuterol rescue inhaler used occasionally. They have a home      nebulizer with albuterol which has not been needed, and they have      an EpiPen.  Drug intolerant to no medicines. Anaphylaxis to PEANUTS/TREE NUT and  probable allergic reaction to LIMA BEAN.   OBJECTIVE:  Weight 198 pounds, blood pressure 108/54, pulse regular 62,  room air saturation 100%. Exzematiod changes. Pleasant young lady.  Conjunctivae are not injected. Nasal mucosa is wet with some mucus  bridging, but no significant edema. Pharynx is clear. Lungs sound clear  on auscultation, I hear no wheeze at all. Heart sounds are regular  without murmur.   IMPRESSION:  Stable asthma, PEANUT allergy and rhinitis. Eczema being  managed now with Dr. Swaziland and her mother is pleased with progress in  that direction.   PLAN:  1. We refilled Singulair.  2. I agreed after discussions with standby prescription for a      Prednisone taper to take with      them when they travel to [place]  for the holiday.  3. Schedule return in 6 months, earlier p.r.n. with option to advance      vaccine dose at that time.     Clinton D. Maple Hudson, MD, [name], FACP  Electronically Signed    CDY/MedQ  DD: 08/04/2006  DT: 08/05/2006  Job #: [id number]   cc:   Marylu Lund L. Avis Epley, M.D.  Amy Y. Sw[place]il[place], M.D.

## 2011-01-13 NOTE — Assessment & Plan Note (Signed)
[facility]                                 ON-CALL NOTE   [name], PRISCO                   MRN:          [id number]  DATE:09/14/2006                            DOB:          June 15, 1991    Ms. Gitlin' mother called me indicating that this patient has  developed fever and boils on the elbow, along with pain in the left hand  on the same side. She is an allergy patient of Dr. Roxy Cedar with a  history of rash and urticaria, but tonight has more of a febrile. Her  primary care physician is [name] [name], I advised the mother to contact  the primary care physician as this is not an allergic problem, but  rather probably an infection problem. The patient is advised to either  seek immediate attention in the emergency room or contact the primary  care [name] [name].     [name] [name] [name] Field, MD, [facility]  Electronically Signed    PEW/MedQ  DD: 09/14/2006  DT: 09/15/2006  Job #: [id number]   cc:   [name] Fears D. Maple Hudson, MD, FCCP, FACP

## 2011-01-18 ENCOUNTER — Encounter: Payer: Self-pay | Admitting: Internal Medicine

## 2011-01-26 ENCOUNTER — Ambulatory Visit: Payer: Self-pay | Admitting: Internal Medicine

## 2011-03-04 ENCOUNTER — Emergency Department (HOSPITAL_COMMUNITY)
Admission: EM | Admit: 2011-03-04 | Discharge: 2011-03-04 | Disposition: A | Discharge status: Home / Self Care | Payer: BC Managed Care – PPO | Attending: Emergency Medicine | Admitting: Emergency Medicine

## 2011-03-04 ENCOUNTER — Emergency Department (HOSPITAL_COMMUNITY): Payer: BC Managed Care – PPO

## 2011-03-04 DIAGNOSIS — R112 Nausea with vomiting, unspecified: Secondary | ICD-10-CM | POA: Insufficient documentation

## 2011-03-04 DIAGNOSIS — N200 Calculus of kidney: Secondary | ICD-10-CM | POA: Insufficient documentation

## 2011-03-04 DIAGNOSIS — Z87442 Personal history of urinary calculi: Secondary | ICD-10-CM | POA: Insufficient documentation

## 2011-03-04 DIAGNOSIS — J45909 Unspecified asthma, uncomplicated: Secondary | ICD-10-CM | POA: Insufficient documentation

## 2011-03-04 DIAGNOSIS — R109 Unspecified abdominal pain: Secondary | ICD-10-CM | POA: Insufficient documentation

## 2011-03-04 LAB — URINALYSIS, ROUTINE W REFLEX MICROSCOPIC
Glucose, UA: NEGATIVE mg/dL
Leukocytes, UA: NEGATIVE
Nitrite: NEGATIVE
Protein, ur: NEGATIVE mg/dL

## 2011-03-04 LAB — BASIC METABOLIC PANEL
BUN: 13 mg/dL (ref 6–23)
Calcium: 9.3 mg/dL (ref 8.4–10.5)
Creatinine, Ser: 0.86 mg/dL (ref 0.50–1.10)
GFR calc Af Amer: >60 mL/min (ref >60)
GFR calc non Af Amer: >60 mL/min (ref >60)

## 2011-03-04 LAB — CBC
Hemoglobin: 13.4 g/dL (ref 12.0–15.0)
MCH: 28.7 pg (ref 26.0–34.0)
MCHC: 34.4 g/dL (ref 30.0–36.0)
Platelets: 309 10*3/uL (ref 150–400)
RBC: 4.67 MIL/uL (ref 3.87–5.11)

## 2011-03-04 LAB — DIFFERENTIAL
Basophils Absolute: 0 10*3/uL (ref 0.0–0.1)
Basophils Relative: 0 % (ref 0–1)
Eosinophils Absolute: 1 10*3/uL — ABNORMAL HIGH (ref 0.0–0.7)
Monocytes Absolute: 0.8 10*3/uL (ref 0.1–1.0)
Monocytes Relative: 9 % (ref 3–12)
Neutro Abs: 5.1 10*3/uL (ref 1.7–7.7)
Neutrophils Relative %: 56 % (ref 43–77)

## 2011-03-04 LAB — URINE MICROSCOPIC-ADD ON

## 2011-04-03 ENCOUNTER — Ambulatory Visit (INDEPENDENT_AMBULATORY_CARE_PROVIDER_SITE_OTHER): Payer: BC Managed Care – PPO | Admitting: Family Medicine

## 2011-04-03 ENCOUNTER — Encounter: Payer: Self-pay | Admitting: Family Medicine

## 2011-04-03 VITALS — BP 120/70 | HR 78 | Temp 98.1°F | Ht 69.0 in | Wt 219.5 lb

## 2011-04-03 DIAGNOSIS — J45909 Unspecified asthma, uncomplicated: Secondary | ICD-10-CM

## 2011-04-03 DIAGNOSIS — Z0000 Encounter for general adult medical examination without abnormal findings: Secondary | ICD-10-CM | POA: Insufficient documentation

## 2011-04-03 DIAGNOSIS — J309 Allergic rhinitis, unspecified: Secondary | ICD-10-CM

## 2011-04-03 DIAGNOSIS — L259 Unspecified contact dermatitis, unspecified cause: Secondary | ICD-10-CM

## 2011-04-03 DIAGNOSIS — N200 Calculus of kidney: Secondary | ICD-10-CM

## 2011-04-03 MED ORDER — ALBUTEROL SULFATE (5 MG/ML) 0.5% IN NEBU: 2.5000 mg | INHALATION_SOLUTION | Freq: Four times a day (QID) | RESPIRATORY_TRACT | Status: DC | PRN

## 2011-04-03 MED ORDER — ALBUTEROL SULFATE HFA 108 (90 BASE) MCG/ACT IN AERS: 2.0000 | INHALATION_SPRAY | RESPIRATORY_TRACT | Status: DC | PRN

## 2011-04-03 MED ORDER — FLUTICASONE PROPIONATE 50 MCG/ACT NA SUSP: 2.0000 | Freq: Every day | NASAL | Status: DC

## 2011-04-03 MED ORDER — CLOBETASOL PROPIONATE 0.05 % EX OINT: TOPICAL_OINTMENT | Freq: Two times a day (BID) | CUTANEOUS | Status: DC

## 2011-04-03 NOTE — Patient Instructions (Signed)
Eczema / Atopic Dermatitis Atopic dermatitis, or eczema, is an inherited type of sensitive skin. Often people with eczema have a family history of allergies, asthma, or hay fever. It causes a red itchy rash and dry scaly skin. The itchiness may occur before the skin rash and may be very intense. It is not contagious. Eczema is generally worse during the cooler winter months and often improves with the warmth of summer. Eczema usually starts showing signs in infancy. Some children outgrow eczema, but it may last through adulthood. Flare-ups may be caused by:  Eating something or contact with something you are sensitive or allergic to.   Stress.  DIAGNOSIS The diagnosis of eczema is usually based upon symptoms and medical history. TREATMENT Eczema cannot be cured, but symptoms usually can be controlled with treatment or avoidance of allergens (things to which you are sensitive or allergic to).  Controlling the itching and scratching.   Use over-the-counter antihistamines as directed for itching. It is especially useful at night when the itching tends to be worse.   Use over-the-counter steroid creams as directed for itching.   Scratching makes the rash and itching worse and may cause impetigo (a skin infection) if fingernails are contaminated (dirty).   Keeping the skin well moisturized with creams every day. This will seal in moisture and help prevent dryness. Lotions containing alcohol and water can dry the skin and are not recommended.   Limiting exposure to allergens.   Recognizing situations that cause stress.   Developing a plan to manage stress.  HOME CARE INSTRUCTIONS  Take prescription and over-the-counter medicines as directed by your caregiver.   Do not use anything on the skin without checking with your caregiver.   Keep baths or showers short (5 minutes) in warm (not hot) water. Use mild cleansers for bathing. You may add non-perfumed bath oil to the bath water. It is best  to avoid soap and bubble bath.   Immediately after a bath or shower, when the skin is still damp, apply a moisturizing ointment to the entire body. This ointment should be a petroleum ointment. This will seal in moisture and help prevent dryness. The thicker the ointment the better. These should be unscented.   Keep fingernails cut short and wash hands often. If your child has eczema, it may be necessary to put soft gloves or mittens on your child at night.   Dress in clothes made of cotton or cotton blends. Dress lightly, as heat increases itching.   Avoid foods that may cause flare-ups. Common foods include cow's milk, peanut butter, eggs and wheat.   Keep a child with eczema away from anyone with fever blisters. The virus that causes fever blisters (herpes simplex) can cause a serious skin infection in children with eczema.  SEEK MEDICAL CARE IF:  Itching interferes with sleep.   The rash gets worse or is not better within one week following treatment.   The rash looks infected (pus or soft yellow scabs).   You or your child has an oral temperature above 102 F (38.9 C).   Your baby is older than 3 months with a rectal temperature of 100.5 F (38.1 C) or higher for more than 1 day.   The rash flares up after contact with someone who has fever blisters.  SEEK IMMEDIATE MEDICAL CARE IF:  Your baby is older than 3 months with a rectal temperature of 102 F (38.9 C) or higher.   Your baby is older than 3 months   or younger with a rectal temperature of 100.4 F (38 C) or higher.  Document Released: 08/11/2000 Document Re-Released: 11/08/2009 Exit[employer] Patient Information 2011 [employer]. 

## 2011-04-03 NOTE — Progress Notes (Signed)
20 yo here to establish care and CPX.  Nephrolithiasis- two episodes, requiring ER visits. Most recently, 03/04/2011- Notes reviewed.  Went to [facility] for Right flank pain. UA pos for RBCS, CT showed right nephrolithiasis, no obstruction. She did not follow up with urology as she ended up passing the stone. BMET, CBC were within normal limits.  Asthma- quite severe.  Followed by Dr. Maple Hudson, Pulmonary. On daily Qvar, Singulair. Was hospitalized multiple times as a child. Peanuts and seasonal allergies are her triggers. Carries an Epi pen.  Periods are regular and light. Not sexually active.  Has never had a pap smear.  Eczema- has been worse lately, ran out of clobetasol.  Patient Active Problem List  Diagnoses  . ALLERGIC RHINITIS  . ASTHMA  . NEPHROLITHIASIS  . ECZEMA  . Routine general medical examination at a health care facility   Past Medical History  Diagnosis Date  . Allergic rhinitis   . Asthma     hosp 06/2009  . Eczema   . Kidney stone     right ER 09/17/09, 707/2012   Past Surgical History  Procedure Date  . Dental extractions    History  Substance Use Topics  . Smoking status: Current Everyday Smoker  . Smokeless tobacco: Not on file  . Alcohol Use: Not on file   Family History  Problem Relation Age of Onset  . Asthma Mother   . Eczema Mother   . Asthma      uncle  . Eczema      uncle   Allergies  Allergen Reactions  . Peanut-Containing Drug Products     REACTION: unspecified  . Penicillins    Current Outpatient Prescriptions on File Prior to Visit  Medication Sig Dispense Refill  . desloratadine-pseudoephedrine (CLARINEX-D 12 HOUR) 2.5-120 MG per tablet Once a day as needed       . diphenhydrAMINE (BENADRYL) 25 MG tablet Once a day as needed       . EPINEPHrine (EPIPEN) 0.3 mg/0.3 mL DEVI Use as directed when needed       . Fluticasone-Salmeterol (ADVAIR DISKUS) 100-50 MCG/DOSE AEPB Inhale 1 puff into the lungs every 12 (twelve) hours.          . montelukast (SINGULAIR) 10 MG tablet Take 10 mg by mouth at bedtime.         The PMH, PSH, Social History, Family History, Medications, and allergies have been reviewed in [facility], and have been updated if relevant.  ROS: See HPI Patient reports no  vision/ hearing changes,anorexia, weight change, fever ,adenopathy, persistant / recurrent hoarseness, swallowing issues, chest pain, edema,persistant / recurrent cough, hemoptysis, dyspnea(rest, exertional, paroxysmal nocturnal), gastrointestinal  bleeding (melena, rectal bleeding), abdominal pain, excessive heart burn, GU symptoms(dysuria, hematuria, pyuria, voiding/incontinence  Issues) syncope, focal weakness, severe memory loss , depression, anxiety, abnormal bruising/bleeding, major joint swelling, breast masses or abnormal vaginal bleeding.    Physical exam: BP 120/70  Pulse 78  Temp(Src) 98.1 F (36.7 C) (Oral)  Ht 5\' 9"  (1.753 m)  Wt 219 lb 8 oz (99.565 kg)  BMI 32.41 kg/m2  LMP 03/24/2011  General:  Well-developed,overweight appearing,in no acute distress; alert,appropriate and cooperative throughout examination Head:  normocephalic and atraumatic.   Eyes:  vision grossly intact, pupils equal, pupils round, and pupils reactive to light.   Ears:  R ear normal and L ear normal.   Nose:  no external deformity.   Mouth:  good dentition.   Neck:  No deformities, masses, or tenderness noted.  Breasts:  No mass, nodules, thickening, tenderness, bulging, retraction, inflamation, nipple discharge or skin changes noted.   Lungs:  Normal respiratory effort, chest expands symmetrically. Lungs are clear to auscultation, no crackles or wheezes. Heart:  Normal rate and regular rhythm. S1 and S2 normal without gallop, murmur, click, rub or other extra sounds. Abdomen:  Bowel sounds positive,abdomen soft and non-tender without masses, organomegaly or hernias noted. Msk:  No deformity or scoliosis noted of thoracic or lumbar spine.   Extremities:  No  clubbing, cyanosis, edema, or deformity noted with normal full range of motion of all joints.   Neurologic:  alert & oriented X3 and gait normal.   Skin:  Diffuse eczema on bilateral inner aspects of elbows, knees, lateral aspect of hands and back Cervical Nodes:  No lymphadenopathy noted Axillary Nodes:  No palpable lymphadenopathy Psych:  Cognition and judgment appear intact. Alert and cooperative with normal attention span and concentration. No apparent delusions, illusions, hallucinations  Assessment and Plan: 1. Routine general medical examination at a health care facility   Reviewed preventive care protocols, scheduled due services, and updated immunizations Discussed nutrition, exercise, diet, and healthy lifestyle.   2. ECZEMA  Deteriorated.  Refilled clobatesol.  Discussed other OTC preparations.  See pt instructions for details.  3. ASTHMA  Stable.  Continue current medications.  4. ALLERGIC RHINITIS  Stable.  Continue Singulair and Flonase daily.  5. NEPHROLITHIASIS

## 2011-05-02 ENCOUNTER — Telehealth: Payer: Self-pay | Admitting: Internal Medicine

## 2011-05-02 NOTE — Telephone Encounter (Signed)
I spoke with [name] [name] and she states to bring pt in tomorrow at 11:!5. Pt is aware of apt date and time. Nothing further was needed

## 2011-05-03 ENCOUNTER — Encounter: Payer: Self-pay | Admitting: Internal Medicine

## 2011-05-03 ENCOUNTER — Ambulatory Visit (INDEPENDENT_AMBULATORY_CARE_PROVIDER_SITE_OTHER): Payer: BC Managed Care – PPO | Admitting: Internal Medicine

## 2011-05-03 VITALS — BP 130/76 | HR 67 | Ht 69.0 in | Wt 222.6 lb

## 2011-05-03 DIAGNOSIS — Z23 Encounter for immunization: Secondary | ICD-10-CM

## 2011-05-03 DIAGNOSIS — L259 Unspecified contact dermatitis, unspecified cause: Secondary | ICD-10-CM

## 2011-05-03 DIAGNOSIS — J309 Allergic rhinitis, unspecified: Secondary | ICD-10-CM

## 2011-05-03 DIAGNOSIS — J45909 Unspecified asthma, uncomplicated: Secondary | ICD-10-CM

## 2011-05-03 MED ORDER — ALBUTEROL SULFATE (5 MG/ML) 0.5% IN NEBU: 2.5000 mg | INHALATION_SOLUTION | Freq: Four times a day (QID) | RESPIRATORY_TRACT | Status: DC | PRN

## 2011-05-03 MED ORDER — CEPHALEXIN 500 MG PO CAPS: 500.0000 mg | ORAL_CAPSULE | Freq: Four times a day (QID) | ORAL | Status: AC

## 2011-05-03 MED ORDER — PREDNISONE (PAK) 10 MG PO TABS: ORAL_TABLET | ORAL | Status: DC

## 2011-05-03 MED ORDER — COMPRESSOR/NEBULIZER MISC: Status: DC

## 2011-05-03 MED ORDER — CHLORHEXIDINE GLUCONATE 2 % EX PADS: MEDICATED_PAD | CUTANEOUS | Status: DC

## 2011-05-03 NOTE — Patient Instructions (Addendum)
Scripts for keflex antibiotic and for chlorhexidine skin wipes to try to clear up skin infections.  Order- lab- IgE level  Dx asthma  Script prednisone taper, nebulizer machine and medication  Flu vax

## 2011-05-03 NOTE — Progress Notes (Signed)
  Subjective:    Patient ID: [name] [name], female    DOB: 15-Jul-1991, 20 y.o.   MRN: [id number]  HPI 05/03/11-9 year old never smoking female followed for severe chronic eczema, asthma, allergic rhinitis..... Mother here Last here September 08, 2010- allergy skin test positive. Has been sweating more in recent hot months. With that has had impetiginous lesions on legs and arms. especially in areas of severe eczema.   Wheeziing more in last 2 weeks-sometimes twice daily. Using her rescue inhaler or nebulizer 3-4 times daily but not using Qvar regularly. Mother says history of penicillin was rash  to palms and soles 18 years ago. Nasal congestion. Uses mostly her rescue inhaler.  She feels rhinitis and asthma are controlled but we discussed recognition of exacerbation and maintenance use of her steroid inhaler. Review of Systems     Objective:   Physical Exam  General- Alert, Oriented, Affect-appropriate, Distress- none acute Skin- Severe eczematoid scarring espeically on elbows. Spots of impetigo on arms and legs Lymphadenopathy- none Head- atraumatic            Eyes- Gross vision intact, PERRLA, conjunctivae clear secretions            Ears- Hearing, canals normal            Nose- turbinate edema, sniffing, no-Septal dev, mucus, polyps, erosion, perforation             Throat- Mallampati II , mucosa clear , drainage- none, tonsils- atrophic Neck- flexible , trachea midline, no stridor , thyroid nl, carotid no bruit Chest - symmetrical excursion , unlabored           Heart/CV- RRR , no murmur , no gallop  , no rub, nl s1 s2                           - JVD- none , edema- none, stasis changes- none, varices- none           Lung-  wheeze- mild, unlabored, cough- none , dullness-none, rub- none           Chest wall-  Abd- tender-no, distended-no, bowel sounds-present, HSM- no Br/ Gen/ Rectal- Not done, not indicated Extrem- cyanosis- none, clubbing, none, atrophy- none, strength-  nl Neuro- grossly intact to observation        Assessment & Plan:

## 2011-05-03 NOTE — Assessment & Plan Note (Addendum)
Recheck IgE for possible Xolair trial  Educated again with mother present on appropriate use of maintenance and rescue medications.  Beats nebulizer machine and supplies

## 2011-05-03 NOTE — Assessment & Plan Note (Addendum)
Areas of folliculitis/ impetigo. Will give keflex and chlorhexadine skin wipes

## 2011-05-06 ENCOUNTER — Encounter: Payer: Self-pay | Admitting: Internal Medicine

## 2011-05-06 NOTE — Assessment & Plan Note (Signed)
Actively sniffing today. We discussed this in connection with her history of seasonal rhinitis and conjunctivitis. Environmental precautions and symptomatic therapy discussed.

## 2011-06-02 LAB — URINE MICROSCOPIC-ADD ON

## 2011-06-02 LAB — URINALYSIS, ROUTINE W REFLEX MICROSCOPIC
Bilirubin Urine: NEGATIVE
Glucose, UA: NEGATIVE mg/dL
Ketones, ur: NEGATIVE mg/dL
Protein, ur: NEGATIVE mg/dL
Urobilinogen, UA: 0.2 mg/dL (ref 0.0–1.0)

## 2011-06-02 LAB — POCT PREGNANCY, URINE: Preg Test, Ur: NEGATIVE

## 2011-06-12 ENCOUNTER — Telehealth: Payer: Self-pay | Admitting: Internal Medicine

## 2011-06-12 MED ORDER — PREDNISONE 10 MG PO TABS: ORAL_TABLET | ORAL | Status: DC

## 2011-06-12 NOTE — Telephone Encounter (Signed)
Called, spoke with pt.  She is aware CDY ok with her taking a pred taper as directed below.  She verbalized understanding of this and is aware rx sent to [facility].    Pt uses [facility] for the albuterol nebs.  Pls advise.  Thanks!

## 2011-06-12 NOTE — Telephone Encounter (Signed)
Pt is requesting an rx for Prednisone due to recent chest tightness and congestion with her asthma. She says she always has trouble with the changing of the seasons. She also wants to know if her rx for albuterol neb sol can be changed back to the vials. This time she received a bottle of the solution and she does not like this as well. Pls advise. Allergies  Allergen Reactions  . Peanut-Containing Drug Products     REACTION: unspecified  . Penicillins

## 2011-06-12 NOTE — Telephone Encounter (Signed)
Per CY-ok to give Prednisone 10mg  #20 take 4 x 2 days, 3 x 2 days, 2 x 2 days, 1 x 2 days, then stop no refills and Does she get neb solution from Drug store or DME company?

## 2011-06-12 NOTE — Telephone Encounter (Signed)
Unsure why patient got a bottle of nebulizer-okay to call and give vials of nebulizer instead.

## 2011-06-12 NOTE — Telephone Encounter (Signed)
Called, spoke with Chere with [facility].  States pt received the albuterol neb bottle because order was written for albuterol 0.5% nebulizer solution with a dispense quantity of 20mL, which is the bottle.  The albuterol neb vials they have in stock are albuterol 0.083% (2.5mg /88mL) - 25 vials come in 1 box.  Dr. Maple Hudson, are you ok with changing the strength of the albuterol so pt can receive the vials?  Pls advise. Thanks!

## 2011-06-12 NOTE — Telephone Encounter (Signed)
PT'S MOM TERESA Chunn ASKED THAT PT'S NEB MEDS BE CALLED IN ASAP AS IT IS LATE IN THE DAY. CALL IN TO [facility] ON [address]. CALL PT AT [phone] TO CONFIRM WHEN THIS IS DONE. Hazel Sams

## 2011-06-13 NOTE — Telephone Encounter (Signed)
Called pharmacy and advised of CDY approval of change. And pt is aware. Julaine Hua, CMA

## 2011-06-13 NOTE — Telephone Encounter (Signed)
Per CY-okay to change rx strength

## 2011-07-11 ENCOUNTER — Other Ambulatory Visit: Payer: Self-pay | Admitting: Family Medicine

## 2011-08-03 ENCOUNTER — Encounter (HOSPITAL_COMMUNITY): Payer: Self-pay | Admitting: *Deleted

## 2011-08-03 ENCOUNTER — Emergency Department (HOSPITAL_COMMUNITY)
Admission: EM | Admit: 2011-08-03 | Discharge: 2011-08-03 | Disposition: A | Discharge status: Home / Self Care | Payer: BC Managed Care – PPO | Attending: Emergency Medicine | Admitting: Emergency Medicine

## 2011-08-03 DIAGNOSIS — M549 Dorsalgia, unspecified: Secondary | ICD-10-CM | POA: Insufficient documentation

## 2011-08-03 DIAGNOSIS — R112 Nausea with vomiting, unspecified: Secondary | ICD-10-CM | POA: Insufficient documentation

## 2011-08-03 DIAGNOSIS — R10813 Right lower quadrant abdominal tenderness: Secondary | ICD-10-CM | POA: Insufficient documentation

## 2011-08-03 DIAGNOSIS — N23 Unspecified renal colic: Secondary | ICD-10-CM | POA: Insufficient documentation

## 2011-08-03 LAB — URINALYSIS, ROUTINE W REFLEX MICROSCOPIC
Bilirubin Urine: NEGATIVE
Protein, ur: 30 mg/dL — AB
Specific Gravity, Urine: 1.038 — ABNORMAL HIGH (ref 1.005–1.030)
Urobilinogen, UA: 0.2 mg/dL (ref 0.0–1.0)

## 2011-08-03 LAB — URINE MICROSCOPIC-ADD ON

## 2011-08-03 MED ORDER — ONDANSETRON 8 MG PO TBDP
ORAL_TABLET | ORAL | Status: AC
  Administered 2011-08-03: 8 mg via ORAL
  Filled 2011-08-03: qty 1

## 2011-08-03 MED ORDER — ONDANSETRON HCL 4 MG PO TABS: 8.0000 mg | ORAL_TABLET | Freq: Four times a day (QID) | ORAL | Status: AC

## 2011-08-03 MED ORDER — ONDANSETRON 8 MG PO TBDP
8.0000 mg | ORAL_TABLET | Freq: Once | ORAL | Status: AC
  Administered 2011-08-03: 8 mg via ORAL

## 2011-08-03 MED ORDER — ACETAMINOPHEN 500 MG PO TABS
1000.0000 mg | ORAL_TABLET | Freq: Once | ORAL | Status: AC
  Administered 2011-08-03: 1000 mg via ORAL
  Filled 2011-08-03: qty 2

## 2011-08-03 MED ORDER — TRAMADOL HCL 50 MG PO TABS: 50.0000 mg | ORAL_TABLET | Freq: Four times a day (QID) | ORAL | Status: DC | PRN

## 2011-08-03 NOTE — ED Provider Notes (Addendum)
History     CSN: [id number] Arrival date & time: 08/03/2011  7:35 AM   First MD Initiated Contact with Patient 08/03/11 [phone]      Chief Complaint  Patient presents with  . Back Pain  . Nausea    (Consider location/radiation/quality/duration/timing/severity/associated sxs/prior treatment) HPI Complains of right flank pain radiating to right lower quadrant onset 2 AM today accompanied by vomiting 1 time. Pain worse with moving. Quality is such that feels like kidney stone she's had in the past. No treatment prior to coming here pain is improved since she vomited one time. No fever . No other associated symptoms Pt. no longer nauseated. Past Medical History  Diagnosis Date  . Allergic rhinitis   . Asthma     hosp 06/2009  . Eczema   . Kidney stone     right ER 09/17/09, 707/2012    Past Surgical History  Procedure Date  . Dental extractions     Family History  Problem Relation Age of Onset  . Asthma Mother   . Eczema Mother   . Asthma      uncle  . Eczema      uncle    History  Substance Use Topics  . Smoking status: Current Everyday Smoker  . Smokeless tobacco: Not on file  . Alcohol Use: Not on file     wine, liquor,beer on weekends    OB History    Grav Para Term Preterm Abortions TAB SAB Ect Mult Living                  Review of Systems  Constitutional: Negative.   HENT: Negative.   Respiratory: Negative.   Cardiovascular: Negative.   Gastrointestinal: Positive for vomiting and abdominal pain.  Genitourinary: Positive for flank pain.       Currently on menses  Musculoskeletal: Negative.   Skin: Negative.   Neurological: Negative.   Hematological: Negative.   Psychiatric/Behavioral: Negative.   All other systems reviewed and are negative.    Allergies  Peanut-containing drug products and Penicillins  Home Medications   Current Outpatient Rx  Name Route Sig Dispense Refill  . ALBUTEROL SULFATE HFA 108 (90 BASE) MCG/ACT IN AERS Inhalation  Inhale 2 puffs into the lungs every 4 (four) hours as needed for wheezing. 2 puffs 4 times a day as needed 1 Inhaler 6  . ALBUTEROL SULFATE (5 MG/ML) 0.5% IN NEBU Nebulization Take 2.5 mg by nebulization every 6 (six) hours as needed for wheezing or shortness of breath. 20 mL prn  . BECLOMETHASONE DIPROPIONATE 80 MCG/ACT IN AERS Inhalation Inhale 2 puffs into the lungs 2 (two) times daily.      . CHLORHEXIDINE GLUCONATE 2 % EX PADS  Wipe over skin once daily after bathing. 6 each prn  . CLOBETASOL PROPIONATE 0.05 % EX OINT  APPLY TOPICALLY 2 TIMES A DAY AS        DIRECTED 30 g 0  . DESLORATADINE-PSEUDOEPHED ER 2.5-120 MG PO TB12  Once a day as needed     . DIPHENHYDRAMINE HCL 25 MG PO TABS  Once a day as needed     . EPINEPHRINE 0.3 MG/0.3ML IJ DEVI  Use as directed when needed     . FLUTICASONE PROPIONATE 50 MCG/ACT NA SUSP Nasal Place 2 sprays into the nose daily. 16 g 1  . MONTELUKAST SODIUM 10 MG PO TABS Oral Take 10 mg by mouth at bedtime.      . COMPRESSOR/NEBULIZER MISC  Use 4  times daily when needed 1 each 0  . PREDNISONE 10 MG PO TABS  take 4 x 2 days, 3 x 2 days, 2 x 2 days, 1 x 2 days, then stop 20 tablet 0  . PREDNISONE (PAK) 10 MG PO TABS  4 X 2 DAYS, 3 X 2 DAYS, 2 X 2 DAYS, 1 X 2 DAYS  20 tablet 1    BP 141/106  Pulse 60  Temp(Src) 97.5 F (36.4 C) (Oral)  Resp 16  SpO2 100%  LMP 07/31/2011  Physical Exam  Nursing note and vitals reviewed. Constitutional: She appears well-developed and well-nourished. No distress.       No distress joking with her friend.  HENT:  Head: Normocephalic and atraumatic.  Eyes: Conjunctivae are normal. Pupils are equal, round, and reactive to light.  Neck: Neck supple. No tracheal deviation present. No thyromegaly present.  Cardiovascular: Normal rate and regular rhythm.   No murmur heard. Pulmonary/Chest: Effort normal and breath sounds normal.  Abdominal: Soft. Bowel sounds are normal. She exhibits no distension. There is tenderness.        Right lower quadrant tenderness tenderness minimal, normal bowel sounds  Genitourinary:       Right flank tenderness tenderness is mild  Musculoskeletal: Normal range of motion. She exhibits no edema and no tenderness.  Neurological: She is alert. Coordination normal.  Skin: Skin is warm and dry. No rash noted.  Psychiatric: She has a normal mood and affect.    ED Course  Procedures (including critical care time) 9:10 AM patient developed dry heaves. She was reexamined by me she's in no distress abdomen reexamined no tenderness. Right flank is nontender.  Labs Reviewed  URINALYSIS, ROUTINE W REFLEX MICROSCOPIC  POCT PREGNANCY, URINE   No results found.   No diagnosis found.   11:30 AM patient asymptomatic pain free.  MDM  Patient's records reviewed renal function was normal 7 months ago. Plan prescription for tramadol, Zofran, blood pressure recheck within 3 weeks [facility] referral urine strainer Diagnosis #1 ureteral colic #2 elevated blood pressure        Doug Sou, MD 08/03/11 1139  Doug Sou, MD 08/03/11 1140

## 2011-08-03 NOTE — ED Notes (Signed)
Pt states she is having right side back pain radiating to her lower pelvic. 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 Pt states she has had kidney stones before

## 2011-08-03 NOTE — ED Notes (Signed)
Vital signs stable. 

## 2011-08-03 NOTE — ED Notes (Signed)
MD at bedside. 

## 2011-08-03 NOTE — ED Notes (Signed)
Patient is resting comfortably. 

## 2011-08-03 NOTE — ED Notes (Signed)
Family at bedside. 

## 2011-08-14 ENCOUNTER — Ambulatory Visit (INDEPENDENT_AMBULATORY_CARE_PROVIDER_SITE_OTHER): Payer: BC Managed Care – PPO | Admitting: Family Medicine

## 2011-08-14 ENCOUNTER — Encounter: Payer: Self-pay | Admitting: Family Medicine

## 2011-08-14 VITALS — BP 130/72 | HR 89 | Temp 98.6°F | Resp 20 | Ht 68.5 in | Wt 215.2 lb

## 2011-08-14 DIAGNOSIS — N200 Calculus of kidney: Secondary | ICD-10-CM

## 2011-08-14 DIAGNOSIS — N23 Unspecified renal colic: Secondary | ICD-10-CM | POA: Insufficient documentation

## 2011-08-14 LAB — BASIC METABOLIC PANEL
CO2: 26 mEq/L (ref 19–32)
Calcium: 8.7 mg/dL (ref 8.4–10.5)
Potassium: 3.7 mEq/L (ref 3.5–5.1)
Sodium: 141 mEq/L (ref 135–145)

## 2011-08-14 MED ORDER — TRAMADOL HCL 50 MG PO TABS: 50.0000 mg | ORAL_TABLET | Freq: Four times a day (QID) | ORAL | Status: DC | PRN

## 2011-08-14 NOTE — Patient Instructions (Signed)
Good to see you. Please stop by to see Shirlee Limerick after you go to the lab to set up your urology appointment. Have a wonderful holiday.

## 2011-08-14 NOTE — Progress Notes (Signed)
20 yo here for ER follow up.  Notes reviewed. Went to [facility] on 08/03/2011 with back pain and nausea. UA positive for large blood. No further studies done. Advised follow up with urology but appointment was never made.  H/o nephrolithiasis,  Requiring multiple ER visits.  Most recently, 03/04/2011. CT showed right nephrolithiasis, no obstruction.  She is unsure if she passed a stone. Hematuria, nausea have resolved. Still has some right flank pain on and off.  No fever.  Patient Active Problem List  Diagnoses  . ALLERGIC RHINITIS  . ASTHMA  . NEPHROLITHIASIS  . ECZEMA  . Routine general medical examination at a health care facility  . Colic, ureteral   Past Medical History  Diagnosis Date  . Allergic rhinitis   . Asthma     hosp 06/2009  . Eczema   . Kidney stone     right ER 09/17/09, 707/2012   Past Surgical History  Procedure Date  . Dental extractions    History  Substance Use Topics  . Smoking status: Current Everyday Smoker  . Smokeless tobacco: Not on file  . Alcohol Use: Not on file     wine, liquor,beer on weekends   Family History  Problem Relation Age of Onset  . Asthma Mother   . Eczema Mother   . Asthma      uncle  . Eczema      uncle   Allergies  Allergen Reactions  . Latex Hives  . Peanut-Containing Drug Products     REACTION: unspecified  . Penicillins Hives   Current Outpatient Prescriptions on File Prior to Visit  Medication Sig Dispense Refill  . albuterol (PROAIR HFA) 108 (90 BASE) MCG/ACT inhaler Inhale 2 puffs into the lungs every 4 (four) hours as needed for wheezing. 2 puffs 4 times a day as needed  1 Inhaler  6  . albuterol (PROVENTIL) (5 MG/ML) 0.5% nebulizer solution Take 2.5 mg by nebulization every 6 (six) hours as needed for wheezing or shortness of breath.  20 mL  prn  . beclomethasone (QVAR) 80 MCG/ACT inhaler Inhale 2 puffs into the lungs 2 (two) times daily.        . clobetasol (TEMOVATE) 0.05 % ointment APPLY TOPICALLY  2 TIMES A DAY AS        DIRECTED  30 g  0  . desloratadine-pseudoephedrine (CLARINEX-D 12 HOUR) 2.5-120 MG per tablet Once a day as needed       . diphenhydrAMINE (BENADRYL) 25 MG tablet Once a day as needed       . EPINEPHrine (EPIPEN) 0.3 mg/0.3 mL DEVI Use as directed when needed       . fluticasone (FLONASE) 50 MCG/ACT nasal spray Place 2 sprays into the nose daily.  16 g  1  . montelukast (SINGULAIR) 10 MG tablet Take 10 mg by mouth at bedtime.        . traMADol (ULTRAM) 50 MG tablet Take 1 tablet (50 mg total) by mouth every 6 (six) hours as needed for pain. Maximum dose= 8 tablets per day  15 tablet  0   The PMH, PSH, Social History, Family History, Medications, and allergies have been reviewed in [facility], and have been updated if relevant.  ROS: See HPI Patient reports no  vision/ hearing changes,anorexia, weight change, fever ,adenopathy, persistant / recurrent hoarseness, swallowing issues, chest pain, edema,persistant / recurrent cough, hemoptysis, dyspnea(rest, exertional, paroxysmal nocturnal), gastrointestinal  bleeding (melena, rectal bleeding), abdominal pain, excessive heart burn, GU symptoms(dysuria, hematuria, pyuria,  voiding/incontinence  Issues) syncope, focal weakness, severe memory loss , depression, anxiety, abnormal bruising/bleeding, major joint swelling, breast masses or abnormal vaginal bleeding.    Physical exam: LMP 07/31/2011 BP 130/72  Pulse 89  Temp(Src) 98.6 F (37 C) (Oral)  Resp 20  Ht 5' 8.5" (1.74 m)  Wt 215 lb 4 oz (97.637 kg)  BMI 32.25 kg/m2  SpO2 96%  LMP 07/31/2011  General:  Well-developed,overweight appearing,in no acute distress; alert,appropriate and cooperative throughout examination Head:  normocephalic and atraumatic.   Eyes:  vision grossly intact, pupils equal, pupils round, and pupils reactive to light.   Ears:  R ear normal and L ear normal.   Nose:  no external deformity.   Mouth:  good dentition.   Neck:  No deformities, masses, or  tenderness noted. Breasts:  No mass, nodules, thickening, tenderness, bulging, retraction, inflamation, nipple discharge or skin changes noted.   Lungs:  Normal respiratory effort, chest expands symmetrically. Lungs are clear to auscultation, no crackles or wheezes. Heart:  Normal rate and regular rhythm. S1 and S2 normal without gallop, murmur, click, rub or other extra sounds. Abdomen:  Bowel sounds positive,abdomen soft and non-tender without masses, organomegaly or hernias noted. Msk:  No deformity or scoliosis noted of thoracic or lumbar spine.   Extremities:  No clubbing, cyanosis, edema, or deformity noted with normal full range of motion of all joints.   Neurologic:  alert & oriented X3 and gait normal.   Skin:  Diffuse eczema on bilateral inner aspects of elbows, knees, lateral aspect of hands and back Psych:  Cognition and judgment appear intact. Alert and cooperative with normal attention span and concentration. No apparent delusions, illusions, hallucinations  Assessment and Plan: 1. NEPHROLITHIASIS   Unchanged.  Refer to urology for further work up.  2. Colic, ureteral   Intermittent.  Refilled rx for tramadol.  She was unable to give urine sample today. Orders Placed This Encounter  Procedures  . Basic Metabolic Panel (BMET)  . Ambulatory referral to Urology

## 2011-08-15 ENCOUNTER — Encounter: Payer: Self-pay | Admitting: *Deleted

## 2011-08-30 ENCOUNTER — Other Ambulatory Visit: Payer: Self-pay | Admitting: Internal Medicine

## 2011-09-11 ENCOUNTER — Other Ambulatory Visit: Payer: Self-pay | Admitting: Family Medicine

## 2011-09-29 ENCOUNTER — Ambulatory Visit: Payer: BC Managed Care – PPO | Admitting: Internal Medicine

## 2011-11-02 ENCOUNTER — Telehealth: Payer: Self-pay | Admitting: Internal Medicine

## 2011-11-02 ENCOUNTER — Other Ambulatory Visit: Payer: Self-pay | Admitting: Family Medicine

## 2011-11-02 ENCOUNTER — Ambulatory Visit: Payer: BC Managed Care – PPO | Admitting: Internal Medicine

## 2011-11-02 NOTE — Telephone Encounter (Signed)
Pt was last seen in 04/2011 med is on pt's list

## 2011-11-02 NOTE — Telephone Encounter (Signed)
Clobetasol Oint already filled by Dr. Dayton Martes today. Pt is aware.

## 2011-11-17 ENCOUNTER — Ambulatory Visit (INDEPENDENT_AMBULATORY_CARE_PROVIDER_SITE_OTHER): Payer: BC Managed Care – PPO | Admitting: Internal Medicine

## 2011-11-17 ENCOUNTER — Encounter: Payer: Self-pay | Admitting: Internal Medicine

## 2011-11-17 VITALS — BP 122/68 | HR 88 | Ht 69.0 in | Wt 223.4 lb

## 2011-11-17 DIAGNOSIS — J309 Allergic rhinitis, unspecified: Secondary | ICD-10-CM

## 2011-11-17 DIAGNOSIS — L259 Unspecified contact dermatitis, unspecified cause: Secondary | ICD-10-CM

## 2011-11-17 DIAGNOSIS — J45909 Unspecified asthma, uncomplicated: Secondary | ICD-10-CM

## 2011-11-17 MED ORDER — CLOBETASOL PROPIONATE 0.05 % EX OINT: TOPICAL_OINTMENT | Freq: Two times a day (BID) | CUTANEOUS | Status: DC

## 2011-11-17 MED ORDER — FLUTICASONE-SALMETEROL 100-50 MCG/DOSE IN AEPB: 1.0000 | INHALATION_SPRAY | Freq: Two times a day (BID) | RESPIRATORY_TRACT | Status: DC

## 2011-11-17 MED ORDER — FLUTICASONE PROPIONATE 50 MCG/ACT NA SUSP: 2.0000 | Freq: Every day | NASAL | Status: DC

## 2011-11-17 MED ORDER — ALBUTEROL SULFATE (5 MG/ML) 0.5% IN NEBU: 2.5000 mg | INHALATION_SOLUTION | Freq: Four times a day (QID) | RESPIRATORY_TRACT | Status: DC | PRN

## 2011-11-17 MED ORDER — ALBUTEROL SULFATE HFA 108 (90 BASE) MCG/ACT IN AERS: 2.0000 | INHALATION_SPRAY | RESPIRATORY_TRACT | Status: DC | PRN

## 2011-11-17 MED ORDER — MONTELUKAST SODIUM 10 MG PO TABS: 10.0000 mg | ORAL_TABLET | Freq: Every day | ORAL | Status: DC

## 2011-11-17 NOTE — Patient Instructions (Signed)
Med refills sent  Please don't smoke !  Please call as needed

## 2011-11-17 NOTE — Progress Notes (Signed)
Patient ID: [name] [name], female    DOB: 12/13/1990, 20 y.o.   MRN: [id number]  HPI 05/03/11-54 year old never smoking female followed for severe chronic eczema, asthma, allergic rhinitis..... Mother here Last here September 08, 2010- allergy skin test positive. Has been sweating more in recent hot months. With that has had impetiginous lesions on legs and arms. especially in areas of severe eczema.   Wheeziing more in last 2 weeks-sometimes twice daily. Using her rescue inhaler or nebulizer 3-4 times daily but not using Qvar regularly. Mother says history of penicillin was rash  to palms and soles 18 years ago. Nasal congestion. Uses mostly her rescue inhaler.  She feels rhinitis and asthma are controlled but we discussed recognition of exacerbation and maintenance use of her steroid inhaler.  11/17/11- 75 year old never smoking female followed for severe chronic eczema, asthma, allergic rhinitis  she reports a good winter with no significant respiratory infections. Needs medication refills but denies new problems. Using Flonase daily. Admits to social smoking occasionally-we discussed this and I asked her to quit now while she can.  She asks refill of her eczema cream- we discussed again.  ROS-see HPI Constitutional:   No-   weight loss, night sweats, fevers, chills, fatigue, lassitude. HEENT:   No-  headaches, difficulty swallowing, tooth/dental problems, sore throat,       Controlled sneezing, itching, ear ache, nasal congestion, post nasal drip,  CV:  No-   chest pain, orthopnea, PND, swelling in lower extremities, anasarca,  dizziness, palpitations Resp: No-   shortness of breath with exertion or at rest.              No-   productive cough,  No non-productive cough,  No- coughing up of blood.              No-   change in color of mucus.  No- wheezing.   Skin: No-   rash or lesions. GI:  No-   heartburn, indigestion, abdominal pain, nausea, vomiting,  GU:  MS:  No-   joint pain or  swelling.   Neuro-     nothing unusual Psych:  No- change in mood or affect. No depression or anxiety.  No memory loss.      Objective:   Physical Exam  General- Alert, Oriented, Affect-appropriate, Distress- none acute Skin- Severe eczematoid scarring espeically on elbows.  Lymphadenopathy- none Head- atraumatic            Eyes- Gross vision intact, PERRLA, conjunctivae clear secretions            Ears- Hearing, canals normal            Nose- clear, no-Septal dev, mucus, polyps, erosion, perforation             Throat- Mallampati II , mucosa clear , drainage- none, tonsils- atrophic Neck- flexible , trachea midline, no stridor , thyroid nl, carotid no bruit Chest - symmetrical excursion , unlabored           Heart/CV- RRR , no murmur , no gallop  , no rub, nl s1 s2                           - JVD- none , edema- none, stasis changes- none, varices- none           Lung-  wheeze- mild, unlabored, cough- none , dullness-none, rub- none  Chest wall-  Abd-  Br/ Gen/ Rectal- Not done, not indicated Extrem- cyanosis- none, clubbing, none, atrophy- none, strength- nl Neuro- grossly intact to observation

## 2011-11-22 NOTE — Assessment & Plan Note (Signed)
Pretty good control right now. I'm concerned about spring pollen season and her smoking. We discussed both of these.

## 2011-11-22 NOTE — Assessment & Plan Note (Signed)
Significant old scarring, cosmetically important to [name] [name] woman. Refilled clobetasol- we've cautioned against overuse.

## 2011-11-22 NOTE — Assessment & Plan Note (Addendum)
Plan-continue nasal steroid

## 2011-12-20 ENCOUNTER — Telehealth: Payer: Self-pay | Admitting: Internal Medicine

## 2011-12-20 MED ORDER — PREDNISONE 10 MG PO TABS: ORAL_TABLET | ORAL | Status: DC

## 2011-12-20 NOTE — Telephone Encounter (Signed)
Per CY-okay to give Prednisone 10mg #20 take 4 x 2 days, 3 x 2 days, 2 x 2 days, 1 x 2 days, then stop no refills.  

## 2011-12-20 NOTE — Telephone Encounter (Signed)
LMOMTCB x 1 

## 2011-12-20 NOTE — Telephone Encounter (Signed)
Pt informed that rx for Pred taper would be sent to pharmacy per Dr Maple Hudson.

## 2011-12-20 NOTE — Telephone Encounter (Signed)
Pt c/o increased SOB and chest tightness. Prod cough (yellow).  Runny nose and itchy eyes.  Using Albuterol HHN 5-6 times a day.  Please advise. Allergies  Allergen Reactions  . Latex Hives  . Peanut-Containing Drug Products     REACTION: unspecified  . Penicillins Hives

## 2011-12-20 NOTE — Telephone Encounter (Signed)
Pt returned call. [name] [name]  

## 2011-12-28 ENCOUNTER — Ambulatory Visit (INDEPENDENT_AMBULATORY_CARE_PROVIDER_SITE_OTHER): Payer: BC Managed Care – PPO | Admitting: Internal Medicine

## 2011-12-28 VITALS — BP 114/73 | HR 63 | Temp 98.2°F | Resp 16 | Ht 68.0 in | Wt 217.0 lb

## 2011-12-28 DIAGNOSIS — H9202 Otalgia, left ear: Secondary | ICD-10-CM

## 2011-12-28 DIAGNOSIS — H9209 Otalgia, unspecified ear: Secondary | ICD-10-CM

## 2011-12-28 MED ORDER — CIPROFLOXACIN-HYDROCORTISONE 0.2-1 % OT SUSP: OTIC | Status: DC

## 2011-12-28 NOTE — Patient Instructions (Signed)
Use the Cipro ear drops as needed for 3-5 days in your left ear.  If your ears still feel plugged but are not sore, try some Cerumenex or Debrox and follow the directions.  If ear discomfort or wax build-up persists, return to our clinic.

## 2011-12-28 NOTE — Progress Notes (Signed)
  Subjective:    Patient ID: [name] [name], female    DOB: [birth year], 21 y.o.   MRN: [id number]  HPI  [name] [name] is here for a 2 day history of left ear pain.  She states she was trying to clean her ears with a Qtip and afterwards her left ear was sore. She has not had any ear drainage or sinus congestion the last few days.  She sees Dr. Maple Hudson for her asthma which is currently well controlled.  She just finished a course of prednisone last week for a sinus infection, stating her sinuses feel fine now.  She is studying Network engineer in college.    Review of Systems  All other systems reviewed and are negative.  Negative except as noted in HPI     Objective:   Physical Exam  Vitals reviewed. Constitutional: She is oriented to person, place, and time. She appears well-developed and well-nourished.       obese  HENT:  Head: Normocephalic and atraumatic.  Nose: Nose normal.  Mouth/Throat: Oropharynx is clear and moist. No oropharyngeal exudate.       Left IAC full of cerumen.  Right tragus tender to palpation, no periauricle nodes.  Small abrasion noted in vestibule, IAC full of cerumen and TM not visualized.  White lesion noted on left peritonsillar area, may be healing ulcer,  Eyes: Conjunctivae are normal.  Neck: Neck supple. No tracheal deviation present.  Cardiovascular: Normal rate, regular rhythm and normal heart sounds.   Pulmonary/Chest: Effort normal and breath sounds normal. She has no wheezes. She has no rales.  Lymphadenopathy:    She has no cervical adenopathy.  Neurological: She is alert and oriented to person, place, and time.  Skin: Skin is warm and dry.       Dry skin present on hands and elbows c/w eczema  Psychiatric: She has a normal mood and affect. Her behavior is normal.          Assessment & Plan:  Right ear otalgia c/w trauma.  Given Cipro Otic drops for comfort.  She may try some OTC Cerumenex after her ear canal is healed to remove wax or RTC for  ear irrigation, pt agrees with this plan.  AVS printed and given pt.

## 2012-01-01 ENCOUNTER — Telehealth: Payer: Self-pay | Admitting: Internal Medicine

## 2012-01-01 NOTE — Telephone Encounter (Signed)
Pt state she is using her rescue inhaler more than she should; I asked if she is using her Qvar and/or Advair for maintenance inhaler as this should help her from using her rescue inhaler as much. I discussed with her how to use QVAR and/or Advair as well as her Proair HFA inhaler. Pt informed me she never got the maintenance inhalers filled. I requested she do that today and start using. If she continues to need her rescue inhaler more than prescribed she will need to come in for sooner OV with CY to discuss. Also, pt stated she is having some increased pollen exposure-I informed patient she can take OTC Claritin or allegra to help. Pt verbalized her understanding and will try this first.

## 2012-02-18 ENCOUNTER — Telehealth: Payer: Self-pay | Admitting: Internal Medicine

## 2012-02-18 NOTE — Telephone Encounter (Signed)
  Triage:   - please give appt to see [name] [name] < few days or Dr Maple Hudson < few days to for ae-asthma and samples  MR       Patient called on call MD this Sunday 02/18/2012   - never filled advair given by Dr   Maple Hudson several months ago due to lack of $  - now having ae-asthma with symptom burden 8 of 10 and needing albuterol rescue several times a day. Lot of wheeze. All made worse by pollen   - wants me to call in albuterol neb because she ran out  PLAN  - advised her and educated her to have a more pro-active approach to asthma mgmt   - advised she needs to letting Dr Maple Hudson know abuot financial difficulties  - callled pred taper, albuterol neb and advair to [facility]  - she needs fu with Dr Maple Hudson (dr Maple Hudson to decide timing)  - advised to get samples from office (triage to facilitate)  Dr. Kalman Shan, M.D., [facility].C.P Pulmonary and Critical Care Medicine Staff Physician [facility] [place] [facility] Pager: [phone], If no answer or between  15:00h - 7:00h: call [phone]  02/18/2012 3:48 PM

## 2012-02-19 NOTE — Telephone Encounter (Signed)
[name] [name]- whenever she can schedule with me or Tammy P.

## 2012-02-19 NOTE — Telephone Encounter (Signed)
Spoke with pts mother-appt made for Thursday at 945am; pt will call me back if this time/date doesn't work well.

## 2012-02-22 ENCOUNTER — Ambulatory Visit: Payer: BC Managed Care – PPO | Admitting: Adult Health

## 2012-05-24 ENCOUNTER — Ambulatory Visit: Payer: BC Managed Care – PPO | Admitting: Internal Medicine

## 2012-05-28 ENCOUNTER — Ambulatory Visit (INDEPENDENT_AMBULATORY_CARE_PROVIDER_SITE_OTHER): Payer: BC Managed Care – PPO

## 2012-05-28 DIAGNOSIS — Z23 Encounter for immunization: Secondary | ICD-10-CM

## 2012-07-04 ENCOUNTER — Ambulatory Visit: Payer: BC Managed Care – PPO | Admitting: Internal Medicine

## 2012-07-29 ENCOUNTER — Telehealth: Payer: Self-pay | Admitting: Pulmonary Disease

## 2012-07-29 NOTE — Telephone Encounter (Signed)
The patient is calling with increased cough, wheezing, sneezing, producing yellow sputum, denies fever; she ran put of Advair last week and asking for a refill; she currently is taking her rescue inhaler up to 8 times daily.   I will call in to the pharmacy a Prednisone taper and Advair.   She said she has an appointment schedule with her pulmonologist for December; I cannot confirm in the system; it seems that most of her recent care was handled via the phone. I recommended to see her pulmonologist with first available appointment. I will sent this note to Dr. Maple Hudson and nursing staff.   In the interim I advised her to call us back if not feeling better or to present to the ED for worsening symptoms.

## 2012-07-30 NOTE — Telephone Encounter (Signed)
Spoke with patient-aware to use RX's called in by Dr Frederico Hamman and to keep OV with CY on 08-20-12 at 9:45am.

## 2012-08-13 ENCOUNTER — Telehealth: Payer: Self-pay | Admitting: Internal Medicine

## 2012-08-13 NOTE — Telephone Encounter (Signed)
I spoke with pt and she stated her chest has been tx x 3 days. She is having to use her rescue inhaler more than normal. Pt requested to be seen tomorrow. Since Dr. Maple Hudson is not in she is scheduled to see TP12/18/13 at 12:00 for acute visit. Will forward to Dr. Maple Hudson as an Lorain Childes

## 2012-08-14 ENCOUNTER — Ambulatory Visit: Payer: BC Managed Care – PPO | Admitting: Adult Health

## 2012-08-20 ENCOUNTER — Ambulatory Visit (INDEPENDENT_AMBULATORY_CARE_PROVIDER_SITE_OTHER): Payer: BC Managed Care – PPO | Admitting: Internal Medicine

## 2012-08-20 ENCOUNTER — Other Ambulatory Visit: Payer: BC Managed Care – PPO

## 2012-08-20 ENCOUNTER — Encounter: Payer: Self-pay | Admitting: Internal Medicine

## 2012-08-20 ENCOUNTER — Other Ambulatory Visit: Payer: Self-pay | Admitting: Internal Medicine

## 2012-08-20 VITALS — BP 120/80 | HR 80 | Ht 69.0 in | Wt 233.6 lb

## 2012-08-20 DIAGNOSIS — T781XXA Other adverse food reactions, not elsewhere classified, initial encounter: Secondary | ICD-10-CM

## 2012-08-20 DIAGNOSIS — T7840XA Allergy, unspecified, initial encounter: Secondary | ICD-10-CM

## 2012-08-20 DIAGNOSIS — Z91018 Allergy to other foods: Secondary | ICD-10-CM

## 2012-08-20 DIAGNOSIS — J45909 Unspecified asthma, uncomplicated: Secondary | ICD-10-CM

## 2012-08-20 DIAGNOSIS — J45998 Other asthma: Secondary | ICD-10-CM

## 2012-08-20 DIAGNOSIS — L259 Unspecified contact dermatitis, unspecified cause: Secondary | ICD-10-CM

## 2012-08-20 MED ORDER — ALBUTEROL SULFATE (5 MG/ML) 0.5% IN NEBU: 2.5000 mg | INHALATION_SOLUTION | Freq: Four times a day (QID) | RESPIRATORY_TRACT | Status: DC | PRN

## 2012-08-20 MED ORDER — BECLOMETHASONE DIPROPIONATE 80 MCG/ACT IN AERS: 2.0000 | INHALATION_SPRAY | Freq: Two times a day (BID) | RESPIRATORY_TRACT | Status: DC

## 2012-08-20 MED ORDER — EPINEPHRINE 0.3 MG/0.3ML IJ DEVI: INTRAMUSCULAR | Status: AC

## 2012-08-20 MED ORDER — CLOBETASOL PROPIONATE 0.05 % EX OINT: TOPICAL_OINTMENT | Freq: Two times a day (BID) | CUTANEOUS | Status: DC

## 2012-08-20 NOTE — Progress Notes (Signed)
Patient ID: [name] [name], female    DOB: [birth year], 21 y.o.   MRN: [id number]  HPI 05/03/11-21 year old never smoking female followed for severe chronic eczema, asthma, allergic rhinitis..... Mother here Last here September 08, 2010- allergy skin test positive. Has been sweating more in recent hot months. With that has had impetiginous lesions on legs and arms. especially in areas of severe eczema.   Wheeziing more in last 2 weeks-sometimes twice daily. Using her rescue inhaler or nebulizer 3-4 times daily but not using Qvar regularly. Mother says history of penicillin was rash  to palms and soles 21 years ago. Nasal congestion. Uses mostly her rescue inhaler.  She feels rhinitis and asthma are controlled but we discussed recognition of exacerbation and maintenance use of her steroid inhaler.  11/17/11- 21 year old never smoking female followed for severe chronic eczema, asthma, allergic rhinitis  she reports a good winter with no significant respiratory infections. Needs medication refills but denies new problems. Using Flonase daily. Admits to social smoking occasionally-we discussed this and I asked her to quit now while she can.  She asks refill of her eczema cream- we discussed again.  08/20/12- 21 year old never smoking female followed for severe chronic eczema, asthma, allergic rhinitis FOLLOWS FOR: patient states had asthma problems/cold x2 weeks ago-- denies any other complaints at this time--would like to do allergy test She took prednisone for asthma with a cold 2 weeks ago. Last prior use of prednisone was almost a year ago. Otherwise has done well. Shrimp has been a bad trigger in the past. She ate some shrimp one or 2 months ago by mistake but had no symptoms. She wonders what her food allergy status is. Occasionally eats tomato, pizza and pineapple, all of which use to cause flare of her eczema and asthma. She still keeps an EpiPen. CXR 03/04/11 IMPRESSION:  Mild peribronchial  cuffing suggests reactive airway disease. No  consolidation.  Original Report Authenticated By: Genevive Bi, M.D.   ROS-see HPI Constitutional:   No-   weight loss, night sweats, fevers, chills, fatigue, lassitude. HEENT:   No-  headaches, difficulty swallowing, tooth/dental problems, sore throat,       Controlled sneezing, itching, ear ache, nasal congestion, post nasal drip,  CV:  No-   chest pain, orthopnea, PND, swelling in lower extremities, anasarca,  dizziness, palpitations Resp: No-   shortness of breath with exertion or at rest.              No-   productive cough,  No non-productive cough,  No- coughing up of blood.              No-   change in color of mucus.  No- wheezing.   Skin: No- acute  rash or lesions. GI:  No-   heartburn, indigestion, abdominal pain, nausea, vomiting,  GU:  MS:  No-   joint pain or swelling.   Neuro-     nothing unusual Psych:  No- change in mood or affect. No depression or anxiety.  No memory loss.  Objective:   Physical Exam  General- Alert, Oriented, Affect-appropriate, Distress- none acute Skin- Severe eczematoid scarring espeically on elbows.  Lymphadenopathy- none Head- atraumatic            Eyes- Gross vision intact, PERRLA, conjunctivae clear secretions            Ears- Hearing, canals normal            Nose- clear, no-Septal dev, mucus, polyps, erosion,  perforation             Throat- Mallampati II , mucosa clear , drainage- none, tonsils- atrophic Neck- flexible , trachea midline, no stridor , thyroid nl, carotid no bruit Chest - symmetrical excursion , unlabored           Heart/CV- RRR , no murmur , no gallop  , no rub, nl s1 s2                           - JVD- none , edema- none, stasis changes- none, varices- none           Lung-  wheeze-none today , unlabored, cough- none , dullness-none, rub- none           Chest wall-  Abd-  Br/ Gen/ Rectal- Not done, not indicated Extrem- cyanosis- none, clubbing, none, atrophy- none,  strength- nl Neuro- grossly intact to observation

## 2012-08-20 NOTE — Patient Instructions (Addendum)
Order- lab- Seafood IgE panel  Refil meds- printed  Consider trying applying Milk of Magnesia to areas of eczema twice daily when needed. See if it helps.

## 2012-08-26 LAB — ALLERGEN SEAFOOD PANEL
Clams: 0.19 kU/L — ABNORMAL HIGH
Crab: 0.41 kU/L — ABNORMAL HIGH
Scallop IgE: 0.16 kU/L — ABNORMAL HIGH
Tuna IgE: 0.93 kU/L — ABNORMAL HIGH

## 2012-09-01 ENCOUNTER — Encounter: Payer: Self-pay | Admitting: Internal Medicine

## 2012-09-01 DIAGNOSIS — Z91018 Allergy to other foods: Secondary | ICD-10-CM | POA: Insufficient documentation

## 2012-09-01 NOTE — Assessment & Plan Note (Signed)
I mentioned and "alternative medicine" approach, applying topical milk of magnesia. She might try it one time.

## 2012-09-01 NOTE — Assessment & Plan Note (Signed)
After resolving a recent cold, she is currently doing well.

## 2012-09-01 NOTE — Assessment & Plan Note (Signed)
She wants an update on her status because she has found herself able to eat shrimp, tomato, pizza, pineapple which she could not eat without allergic response in past years Plan-food allergy IgE panel especially for seafood

## 2012-09-20 ENCOUNTER — Other Ambulatory Visit: Payer: Self-pay | Admitting: Internal Medicine

## 2012-10-31 ENCOUNTER — Other Ambulatory Visit: Payer: Self-pay | Admitting: Internal Medicine

## 2013-01-15 ENCOUNTER — Emergency Department (HOSPITAL_COMMUNITY)
Admission: EM | Admit: 2013-01-15 | Discharge: 2013-01-15 | Disposition: A | Discharge status: Home / Self Care | Payer: BC Managed Care – PPO | Attending: Emergency Medicine | Admitting: Emergency Medicine

## 2013-01-15 ENCOUNTER — Emergency Department (HOSPITAL_COMMUNITY): Payer: BC Managed Care – PPO

## 2013-01-15 ENCOUNTER — Ambulatory Visit (INDEPENDENT_AMBULATORY_CARE_PROVIDER_SITE_OTHER): Payer: BC Managed Care – PPO | Admitting: Family Medicine

## 2013-01-15 VITALS — BP 140/64 | HR 143 | Temp 102.3°F | Resp 34

## 2013-01-15 DIAGNOSIS — J189 Pneumonia, unspecified organism: Secondary | ICD-10-CM

## 2013-01-15 DIAGNOSIS — R05 Cough: Secondary | ICD-10-CM | POA: Insufficient documentation

## 2013-01-15 DIAGNOSIS — R000 Tachycardia, unspecified: Secondary | ICD-10-CM | POA: Insufficient documentation

## 2013-01-15 DIAGNOSIS — J45901 Unspecified asthma with (acute) exacerbation: Secondary | ICD-10-CM

## 2013-01-15 DIAGNOSIS — Z91040 Latex allergy status: Secondary | ICD-10-CM | POA: Insufficient documentation

## 2013-01-15 DIAGNOSIS — Z79899 Other long term (current) drug therapy: Secondary | ICD-10-CM | POA: Insufficient documentation

## 2013-01-15 DIAGNOSIS — Z872 Personal history of diseases of the skin and subcutaneous tissue: Secondary | ICD-10-CM | POA: Insufficient documentation

## 2013-01-15 DIAGNOSIS — E860 Dehydration: Secondary | ICD-10-CM

## 2013-01-15 DIAGNOSIS — Z8709 Personal history of other diseases of the respiratory system: Secondary | ICD-10-CM | POA: Insufficient documentation

## 2013-01-15 DIAGNOSIS — R509 Fever, unspecified: Secondary | ICD-10-CM

## 2013-01-15 DIAGNOSIS — J9801 Acute bronchospasm: Secondary | ICD-10-CM

## 2013-01-15 DIAGNOSIS — R059 Cough, unspecified: Secondary | ICD-10-CM | POA: Insufficient documentation

## 2013-01-15 DIAGNOSIS — Z880 Allergy status to penicillin: Secondary | ICD-10-CM | POA: Insufficient documentation

## 2013-01-15 DIAGNOSIS — J45991 Cough variant asthma: Secondary | ICD-10-CM

## 2013-01-15 DIAGNOSIS — F17200 Nicotine dependence, unspecified, uncomplicated: Secondary | ICD-10-CM | POA: Insufficient documentation

## 2013-01-15 DIAGNOSIS — J159 Unspecified bacterial pneumonia: Secondary | ICD-10-CM | POA: Insufficient documentation

## 2013-01-15 DIAGNOSIS — IMO0002 Reserved for concepts with insufficient information to code with codable children: Secondary | ICD-10-CM | POA: Insufficient documentation

## 2013-01-15 DIAGNOSIS — Z87442 Personal history of urinary calculi: Secondary | ICD-10-CM | POA: Insufficient documentation

## 2013-01-15 LAB — POCT INFLUENZA A/B: Influenza A, POC: NEGATIVE

## 2013-01-15 LAB — POCT CBC
Hemoglobin: 14.5 g/dL (ref 12.2–16.2)
Lymph, poc: 1.3 (ref 0.6–3.4)
MCHC: 31.7 g/dL — AB (ref 31.8–35.4)
MPV: 9.9 fL (ref 0–99.8)
POC Granulocyte: 9.8 — AB (ref 2–6.9)
POC LYMPH PERCENT: 11.2 %L (ref 10–50)
POC MID %: 3.4 %M (ref 0–12)
RDW, POC: 14.6 %

## 2013-01-15 LAB — CBC
HCT: 42.6 % (ref 36.0–46.0)
Hemoglobin: 14.2 g/dL (ref 12.0–15.0)
MCV: 85.5 fL (ref 78.0–100.0)
Platelets: 306 10*3/uL (ref 150–400)
RBC: 4.98 MIL/uL (ref 3.87–5.11)
WBC: 10.6 10*3/uL — ABNORMAL HIGH (ref 4.0–10.5)

## 2013-01-15 LAB — BASIC METABOLIC PANEL
CO2: 20 mEq/L (ref 19–32)
Chloride: 104 mEq/L (ref 96–112)
Creatinine, Ser: 0.7 mg/dL (ref 0.50–1.10)
Glucose, Bld: 124 mg/dL — ABNORMAL HIGH (ref 70–99)

## 2013-01-15 MED ORDER — LEVOFLOXACIN 750 MG PO TABS: 750.0000 mg | ORAL_TABLET | Freq: Every day | ORAL | Status: DC

## 2013-01-15 MED ORDER — ALBUTEROL SULFATE (2.5 MG/3ML) 0.083% IN NEBU
2.5000 mg | INHALATION_SOLUTION | Freq: Once | RESPIRATORY_TRACT | Status: AC
  Administered 2013-01-15: 2.5 mg via RESPIRATORY_TRACT

## 2013-01-15 MED ORDER — PREDNISONE 20 MG PO TABS: ORAL_TABLET | ORAL | Status: DC

## 2013-01-15 MED ORDER — IBUPROFEN 800 MG PO TABS
800.0000 mg | ORAL_TABLET | Freq: Once | ORAL | Status: AC
  Administered 2013-01-15: 800 mg via ORAL
  Filled 2013-01-15: qty 1

## 2013-01-15 MED ORDER — IOHEXOL 350 MG/ML SOLN
100.0000 mL | Freq: Once | INTRAVENOUS | Status: AC | PRN
  Administered 2013-01-15: 100 mL via INTRAVENOUS

## 2013-01-15 MED ORDER — ALBUTEROL (5 MG/ML) CONTINUOUS INHALATION SOLN
5.0000 mg/h | INHALATION_SOLUTION | Freq: Once | RESPIRATORY_TRACT | Status: AC
  Administered 2013-01-15: 5 mg/h via RESPIRATORY_TRACT
  Filled 2013-01-15: qty 40

## 2013-01-15 MED ORDER — IPRATROPIUM BROMIDE 0.02 % IN SOLN
0.5000 mg | Freq: Once | RESPIRATORY_TRACT | Status: AC
  Administered 2013-01-15: 0.5 mg via RESPIRATORY_TRACT

## 2013-01-15 NOTE — Progress Notes (Signed)
[facility] [address], [place] [address] [phone]  Date:  01/15/2013   Name:  [name] [name]   DOB:  02-18-1991   MRN:  [id number]  PCP:  Ruthe Mannan, MD    Chief Complaint: No chief complaint on file.   History of Present Illness:  [name] [name] is a 22 y.o. very pleasant female patient who presents with the following:  Here with illness and asthma attack.  She has been ill for about 2 days with cough, malaise, and worsening asthma symptoms.   She has a history of asthma and allergies, as well as eczema.  She is using symbicort and albuterol.  She just did an albuterol neb about 30 minutes prior to coming to clinic.    LMP was a few days ago.    Patient Active Problem List   Diagnosis Date Noted  . Food allergy 09/01/2012  . 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 Routine general medical examination at a health care facility 04/03/2011  . 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 ALLERGIC RHINITIS 07/29/2007  . 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 ECZEMA 07/29/2007    Past Medical History  Diagnosis Date  . Allergic rhinitis   . Asthma     hosp 06/2009  . Eczema   . Kidney stone     right ER 09/17/09, 707/2012    Past Surgical History  Procedure Laterality Date  . Dental extractions      History  Substance Use Topics  . Smoking status: Current Some Day Smoker    Types: Cigarettes  . Smokeless tobacco: Never Used  . Alcohol Use: Not on file     Comment: wine, liquor,beer on weekends    Family History  Problem Relation Age of Onset  . Asthma Mother   . Eczema Mother   . Asthma      uncle  . Eczema      uncle    Allergies  Allergen Reactions  . Latex Hives  . Peanut-Containing Drug Products     REACTION: unspecified  . Penicillins Hives  . Shrimp (Shellfish Allergy) Hives    Medication list has been reviewed and updated.  Current Outpatient Prescriptions on File Prior to Visit  Medication Sig Dispense Refill  . albuterol  (PROVENTIL) (2.5 MG/3ML) 0.083% nebulizer solution USE ONE VIAL IN NEBULIZER EVERY 6 HOURS AS NEEDED  75 mL  0  . albuterol (PROVENTIL) (5 MG/ML) 0.5% nebulizer solution Take 0.5 mLs (2.5 mg total) by nebulization every 6 (six) hours as needed for wheezing or shortness of breath.  25 vial  prn  . beclomethasone (QVAR) 80 MCG/ACT inhaler Inhale 2 puffs into the lungs 2 (two) times daily.  1 Inhaler  prn  . clobetasol ointment (TEMOVATE) 0.05 % Apply topically 2 (two) times daily. Apply to affected areas when needed  120 g  prn  . desloratadine-pseudoephedrine (CLARINEX-D 12 HOUR) 2.5-120 MG per tablet Once a day as needed       . diphenhydrAMINE (BENADRYL) 25 MG tablet Once a day as needed       . EPINEPHrine (EPIPEN) 0.3 mg/0.3 mL DEVI For severe allergic reaction as needed  1 Device  prn  . fluticasone (FLONASE) 50 MCG/ACT nasal spray Place 2 sprays into the nose daily.  16 g  prn  . PROAIR HFA 108 (90 BASE) MCG/ACT inhaler INHALE TWO PUFFS BY MOUTH EVERY 6 HOURS AS NEEDED  9 each  0  . traMADol (ULTRAM) 50 MG tablet Take  1 tablet (50 mg total) by mouth every 6 (six) hours as needed. Maximum dose= 8 tablets per day  30 tablet  1   No current facility-administered medications on file prior to visit.    Review of Systems:  As per HPI- otherwise negative.    Physical Examination: There were no vitals filed for this visit. There were no vitals filed for this visit. There is no weight on file to calculate BMI. Ideal Body Weight:    GEN: WDWN, NAD, Non-toxic, A & O x 3, breathing rapidly, slightly labored.   HEENT: Atraumatic, Normocephalic. Neck supple. No masses, No LAD. Ears and Nose: No external deformity. CV: RRR, No M/G/R. No JVD. No thrill. No extra heart sounds. PULM: CTA B, no wheezes, crackles, rhonchi. No retractions. No resp. distress. No accessory muscle use. ABD: S, NT, ND, mild retractions EXTR: No c/c/e.  Severe eczema  NEURO Normal gait.  PSYCH: Normally interactive.  Conversant. Not depressed or anxious appearing.  Calm demeanor.   Placed oxygen at 2L via [place]- O2 sat to 93%, increased to 3L and sat to 94%.    Results for orders placed in visit on 01/15/13  POCT CBC      Result Value Range   WBC 11.5 (*) 4.6 - 10.2 K/uL   Lymph, poc 1.3  0.6 - 3.4   POC LYMPH PERCENT 11.2  10 - 50 %L   MID (cbc) 0.4  0 - 0.9   POC MID % 3.4  0 - 12 %M   POC Granulocyte 9.8 (*) 2 - 6.9   Granulocyte percent 85.4 (*) 37 - 80 %G   RBC 5.01  4.04 - 5.48 M/uL   Hemoglobin 14.5  12.2 - 16.2 g/dL   HCT, POC 16.1  09.6 - 47.9 %   MCV 91.2  80 - 97 fL   MCH, POC 28.9  27 - 31.2 pg   MCHC 31.7 (*) 31.8 - 35.4 g/dL   RDW, POC 04.5     Platelet Count, POC 356  142 - 424 K/uL   MPV 9.9  0 - 99.8 fL  POCT INFLUENZA A/B      Result Value Range   Influenza A, POC Negative     Influenza B, POC Negative       Pt having more distress in clinic.  Started albuterol/ atrovent neb which improved her sats to 95%. Called EMS for transport to ED  Assessment and Plan: Asthma with acute exacerbation - Plan: albuterol (PROVENTIL) (2.5 MG/3ML) 0.083% nebulizer solution 2.5 mg, ipratropium (ATROVENT) nebulizer solution 0.5 mg  Fever, unspecified - Plan: POCT CBC, POCT Influenza A/B  Severe asthma exacerbation with O2 sat of 89% on room air on arrival.  Transport to ED for further treatment and evaluation.    Signed Abbe Amsterdam, MD

## 2013-01-15 NOTE — ED Provider Notes (Signed)
History     CSN: [id number]  Arrival date & time 01/15/13  1652   First MD Initiated Contact with Patient 01/15/13 1704      Chief Complaint  Patient presents with  . Shortness of Breath    (Consider location/radiation/quality/duration/timing/severity/associated sxs/prior treatment) HPI Comments: 22 year old female with a past medical history of asthma presents to the emergency department from [facility] urgent care complaining of shortness of breath. Patient states she had gradual onset shortness of breath 2 days ago which feels like her normal asthma exacerbations. She has taken multiple at-home nebulizer treatments without relief. Admits to associated productive cough with yellow mucus, fever and chills, temperature at urgent care was 102. She was given a DuoNeb treatment at urgent care with some relief of her symptoms and was given Solu-Medrol by EMS which made her feel a lot better upon arrival to the ED.  Patient is a 22 y.o. female presenting with shortness of breath. The history is provided by the patient and a parent.  Shortness of Breath Associated symptoms: cough, fever and wheezing   Associated symptoms: no chest pain and no vomiting     Past Medical History  Diagnosis Date  . Allergic rhinitis   . Asthma     hosp 06/2009  . Eczema   . Kidney stone     right ER 09/17/09, 707/2012    Past Surgical History  Procedure Laterality Date  . Dental extractions      Family History  Problem Relation Age of Onset  . Asthma Mother   . Eczema Mother   . Asthma      uncle  . Eczema      uncle    History  Substance Use Topics  . Smoking status: Current Some Day Smoker    Types: Cigarettes  . Smokeless tobacco: Never Used  . Alcohol Use: Not on file     Comment: wine, liquor,beer on weekends    OB History   Grav Para Term Preterm Abortions TAB SAB Ect Mult Living                  Review of Systems  Constitutional: Positive for fever and chills.  HENT: Negative  for trouble swallowing.   Respiratory: Positive for cough, shortness of breath and wheezing.   Cardiovascular: Negative for chest pain and leg swelling.  Gastrointestinal: Negative for nausea and vomiting.  Neurological: Negative for dizziness.  All other systems reviewed and are negative.    Allergies  Latex; Peanut-containing drug products; Penicillins; and Shrimp  Home Medications   Current Outpatient Rx  Name  Route  Sig  Dispense  Refill  . albuterol (PROVENTIL HFA;VENTOLIN HFA) 108 (90 BASE) MCG/ACT inhaler   Inhalation   Inhale 2 puffs into the lungs every 6 (six) hours as needed for wheezing or shortness of breath.         Marland Kitchen albuterol (PROVENTIL) (5 MG/ML) 0.5% nebulizer solution   Nebulization   Take 0.5 mLs (2.5 mg total) by nebulization every 6 (six) hours as needed for wheezing or shortness of breath.   25 vial   prn   . budesonide-formoterol (SYMBICORT) 160-4.5 MCG/ACT inhaler   Inhalation   Inhale 2 puffs into the lungs 2 (two) times daily.         . clobetasol ointment (TEMOVATE) 0.05 %   Topical   Apply 1 application topically 2 (two) times daily as needed (for eczema).         . desloratadine-pseudoephedrine (CLARINEX-D  12 HOUR) 2.5-120 MG per tablet   Oral   Take 1 tablet by mouth daily as needed (for allergies).          . diphenhydrAMINE (BENADRYL) 25 MG tablet   Oral   Take 25 mg by mouth every 6 (six) hours as needed for allergies.          Marland Kitchen EPINEPHrine (EPIPEN) 0.3 mg/0.3 mL DEVI      For severe allergic reaction as needed   1 Device   prn     BP 128/78  Pulse 140  SpO2 95%  Physical Exam  Nursing note and vitals reviewed. Constitutional: She is oriented to person, place, and time. She appears well-developed. No distress.  Obese  HENT:  Head: Normocephalic and atraumatic.  Mouth/Throat: Oropharynx is clear and moist.  Eyes: Conjunctivae and EOM are normal. Pupils are equal, round, and reactive to light.  Neck: Normal  range of motion. Neck supple. No tracheal deviation present.  Cardiovascular: Regular rhythm and normal heart sounds.  Tachycardia present.   Pulmonary/Chest: No accessory muscle usage. Tachypnea noted. No respiratory distress. She has no decreased breath sounds. She has wheezes (scattered inspiratory/expiratory). She has no rhonchi.  Musculoskeletal: Normal range of motion. She exhibits no edema.  Neurological: She is alert and oriented to person, place, and time.  Skin: Skin is warm and dry. She is not diaphoretic.  Psychiatric: She has a normal mood and affect. Her behavior is normal.    ED Course  Procedures (including critical care time)  Labs Reviewed - No data to display Dg Chest 2 View  01/15/2013   *RADIOLOGY REPORT*  Clinical Data: Shortness of breath, chest pain, fever, cough, history asthma, smoking  CHEST - 2 VIEW  Comparison: 03/04/2011  Findings: Normal heart size, mediastinal contours and pulmonary vascularity. Elevation of right hilum with atelectasis of right upper lobe new since previous exam. Minimal chronic peribronchial thickening. No segmental consolidation, pleural effusion or pneumothorax. Bones unremarkable.  IMPRESSION: Right upper lobe atelectasis with superior retraction of right hilum. Follow-up exams until resolution recommended to exclude central endobronchial obstruction.   Original Report Authenticated By: Ulyses Southward, M.D.   Ct Angio Chest Pe W/cm &/or Wo Cm  01/15/2013   *RADIOLOGY REPORT*  Clinical Data: Worsening shortness of breath.  Hypoxia. Tachycardia.  CT ANGIOGRAPHY CHEST  Technique:  Multidetector CT imaging of the chest using the standard protocol during bolus administration of intravenous contrast. Multiplanar reconstructed images including MIPs were obtained and reviewed to evaluate the vascular anatomy.  Contrast: OMNIPAQUE IOHEXOL 350 MG/ML SOLN  Comparison: 07/03/2009  Findings: Satisfactory opacification of the pulmonary arteries noted, and  there is no evidence of pulmonary emboli.  No evidence of thoracic aortic aneurysm or dissection.  No hilar or mediastinal lymphadenopathy identified.  Right upper lobe volume loss and consolidation is seen with central air bronchograms noted.  No definite evidence of centrally obstructing mass or lymphadenopathy.  The lungs are otherwise clear.  No evidence of pleural or pericardial effusion.  Both adrenal glands are normal appearance.  IMPRESSION:  1.  Right upper lobe volume loss and consolidation, suspicious for pneumonia.  Chest radiographic followup is recommended to confirm resolution. 2.  No definite evidence of centrally obstructing mass or lymphadenopathy.   Original Report Authenticated By: Myles Rosenthal, M.D.     1. Pneumonia   2. Bronchospasm       MDM  22 year old female presenting from urgent care with shortness of breath. Bilateral wheezing present on exam.  She is feeling better after Solu-Medrol and DuoNeb treatment. Temperature 102.3 in the emergency department. Will obtain chest x-ray to rule out pneumonia and placed on continuous nebs. She is tachycardic and tachypneic, however my suspicion for PE at this time is low. No recent travel, no exogenous estrogen and no recent surgery. She is sitting in the room on her phone texting comfortably. 6:04 PM CXR with non-specific RUL atelectasis with superior retraction of right hilum. Will obtain CT angio chest to r/o PE or any infectious process. Case discussed with Dr. Patria Mane who agrees with plan of care. 7:53 PM CT angio negative for PE, however RUL consolidation suspicious for pneumonia. Will treat with levaquin, prednisone for bronchospasm. Patient still in NAD, resting comfortably in bed  O2 sat 95% on RA. She is stable for discharge. Will f/u with her pulmonologist next week. Return precautions discussed. Patient states understanding of plan and is agreeable.   Trevor Mace, PA-C 01/15/13 1954

## 2013-01-15 NOTE — ED Notes (Signed)
Pt states she has had been sick for past several days. Was seen at [facility] and given duoneb but became more SOB while there. Temp 102. Feels better after tx. Wheezing bil. HR 140 after 95% RA.

## 2013-01-15 NOTE — ED Provider Notes (Signed)
Medical screening examination/treatment/procedure(s) were performed by non-physician practitioner and as supervising physician I was immediately available for consultation/collaboration.   Lyanne Co, MD 01/15/13 2007

## 2013-01-16 ENCOUNTER — Telehealth: Payer: Self-pay | Admitting: Internal Medicine

## 2013-01-16 ENCOUNTER — Other Ambulatory Visit: Payer: Self-pay | Admitting: Internal Medicine

## 2013-01-16 NOTE — Telephone Encounter (Signed)
Rx has already been sent in. Pt is aware. Nothing further was needed. 

## 2013-04-19 ENCOUNTER — Other Ambulatory Visit: Payer: Self-pay | Admitting: Internal Medicine

## 2013-05-07 ENCOUNTER — Ambulatory Visit (INDEPENDENT_AMBULATORY_CARE_PROVIDER_SITE_OTHER): Payer: BC Managed Care – PPO | Admitting: Family Medicine

## 2013-05-07 VITALS — BP 114/70 | HR 94 | Temp 98.9°F | Resp 18 | Wt 224.0 lb

## 2013-05-07 DIAGNOSIS — H109 Unspecified conjunctivitis: Secondary | ICD-10-CM

## 2013-05-07 DIAGNOSIS — H01009 Unspecified blepharitis unspecified eye, unspecified eyelid: Secondary | ICD-10-CM

## 2013-05-07 DIAGNOSIS — H01006 Unspecified blepharitis left eye, unspecified eyelid: Secondary | ICD-10-CM

## 2013-05-07 MED ORDER — OFLOXACIN 0.3 % OP SOLN: 1.0000 [drp] | OPHTHALMIC | Status: DC

## 2013-05-07 NOTE — Progress Notes (Signed)
Subjective:    Patient ID: [name] [name], female    DOB: March 22, 1991, 22 y.o.   MRN: [id number] Chief Complaint  Patient presents with  . Conjunctivitis    right eye swollen and red      HPI  Left eye was a little swollen yesterday but otherwise asymptomatic and then worsened today, red, mildly painful when moving eye or when blinking but not at rest, draining yellow pus.  No known sick contacts. Vision now a little blurred a little with the drainage but no other vision changes. Does have some photophobia.  Does not wear contacts or glasses. No h/o injury. Has some seasonal allergies but not feeling ill at all other than eye.  Past Medical History  Diagnosis Date  . Allergic rhinitis   . Asthma     hosp 06/2009  . Eczema   . Kidney stone     right ER 09/17/09, 707/2012   Current Outpatient Prescriptions on File Prior to Visit  Medication Sig Dispense Refill  . albuterol (PROVENTIL HFA;VENTOLIN HFA) 108 (90 BASE) MCG/ACT inhaler Inhale 2 puffs into the lungs every 6 (six) hours as needed for wheezing or shortness of breath.      Marland Kitchen albuterol (PROVENTIL) (2.5 MG/3ML) 0.083% nebulizer solution USE ONE VIAL IN NEBULIZER EVERY 6 HOURS AS NEEDED  75 mL  2  . albuterol (PROVENTIL) (5 MG/ML) 0.5% nebulizer solution Take 0.5 mLs (2.5 mg total) by nebulization every 6 (six) hours as needed for wheezing or shortness of breath.  25 vial  prn  . budesonide-formoterol (SYMBICORT) 160-4.5 MCG/ACT inhaler Inhale 2 puffs into the lungs 2 (two) times daily.      . clobetasol ointment (TEMOVATE) 0.05 % Apply 1 application topically 2 (two) times daily as needed (for eczema).      . desloratadine-pseudoephedrine (CLARINEX-D 12 HOUR) 2.5-120 MG per tablet Take 1 tablet by mouth daily as needed (for allergies).       . diphenhydrAMINE (BENADRYL) 25 MG tablet Take 25 mg by mouth every 6 (six) hours as needed for allergies.       Marland Kitchen EPINEPHrine (EPIPEN) 0.3 mg/0.3 mL DEVI For severe allergic reaction as  needed  1 Device  prn  . PROAIR HFA 108 (90 BASE) MCG/ACT inhaler INHALE TWO PUFFS BY MOUTH EVERY 6 HOURS AS NEEDED  9 each  prn  . levofloxacin (LEVAQUIN) 750 MG tablet Take 1 tablet (750 mg total) by mouth daily. X 7 days  7 tablet  0  . predniSONE (DELTASONE) 20 MG tablet 2 tabs po daily x 4 days  8 tablet  0   No current facility-administered medications on file prior to visit.   Allergies  Allergen Reactions  . Latex Hives  . Peanut-Containing Drug Products     REACTION: unspecified  . Penicillins Hives  . Shrimp [Shellfish Allergy] Hives      Review of Systems  Constitutional: Negative for fever, chills, diaphoresis and activity change.  HENT: Negative for ear pain, congestion, sore throat, rhinorrhea, neck pain, neck stiffness, dental problem, sinus pressure and tinnitus.   Eyes: Positive for photophobia, pain, discharge, redness, itching and visual disturbance.  Skin: Negative for rash.  Neurological: Negative for dizziness, syncope, facial asymmetry, weakness, light-headedness, numbness and headaches.  Hematological: Negative for adenopathy.  Psychiatric/Behavioral: Negative for sleep disturbance.      BP 114/70  Pulse 94  Temp(Src) 98.9 F (37.2 C) (Oral)  Resp 18  Wt 224 lb (101.606 kg)  BMI 33.06 kg/m2  SpO2  97%  LMP 04/21/2013 Objective:   Physical Exam  Constitutional: She is oriented to person, place, and time. She appears well-developed and well-nourished. No distress.  HENT:  Head: Normocephalic and atraumatic.  Right Ear: External ear normal.  Eyes: EOM are normal. Pupils are equal, round, and reactive to light. Right eye exhibits no chemosis and no exudate. Left eye exhibits discharge. Left eye exhibits no chemosis and no exudate. Left conjunctiva is injected. Left conjunctiva has no hemorrhage. No scleral icterus.  Fundoscopic exam:      The right eye shows no arteriolar narrowing, no exudate, no hemorrhage and no papilledema.       The left eye shows  no arteriolar narrowing, no exudate, no hemorrhage and no papilledema.  Slit lamp exam:      The left eye shows no corneal abrasion, no corneal ulcer, no foreign body and no fluorescein uptake.  Left eye with sig swelling, pain, redness of upper lateral eyelid, left conjunctiva mildly injected Proparacaine numbing drops and fluorescein stain applied and evaluated with ophthalmoscope and black light w/o any abnormality seen.  Pulmonary/Chest: Effort normal.  Neurological: She is alert and oriented to person, place, and time.  Skin: Skin is warm and dry. She is not diaphoretic. No erythema.  Psychiatric: She has a normal mood and affect. Her behavior is normal.      Vision test by snellen chart nml. Assessment & Plan:  Conjunctivitis  Blepharitis, left Advised to use drops every 2 hrs x 2d, then 4x/d to finish a week. To eye dr or RTC if sxs worsening, not resolving, or any vision changes, pain, or photophobia. Meds ordered this encounter  Medications  . ofloxacin (OCUFLOX) 0.3 % ophthalmic solution    Sig: Place 1 drop into the left eye every 4 (four) hours.    Dispense:  10 mL    Refill:  0

## 2013-05-07 NOTE — Patient Instructions (Addendum)
Blepharitis Blepharitis is redness, soreness, and swelling (inflammation) of one or both eyelids. It may be caused by an allergic reaction or a bacterial infection. Blepharitis may also be associated with reddened, scaly skin (seborrhea) of the scalp and eyebrows. While you sleep, eye discharge may cause your eyelashes to stick together. Your eyelids may itch, burn, swell, and may lose their lashes. These will grow back. Your eyes may become sensitive. Blepharitis may recur and need repeated treatment. If this is the case, you may require further evaluation by an eye specialist (ophthalmologist). HOME CARE INSTRUCTIONS   Keep your hands clean.  Use a clean towel each time you dry your eyelids. Do not use this towel to clean other areas. Do not share a towel or makeup with anyone.  Wash your eyelids with warm water or warm water mixed with a small amount of baby shampoo. Do this twice a day or as often as needed.  Wash your face and eyebrows at least once a day.  Use warm compresses 2 times a day for 10 minutes at a time, or as directed by your caregiver.  Apply antibiotic ointment as directed by your caregiver.  Avoid rubbing your eyes.  Avoid wearing makeup until you get better.  Follow up with your caregiver as directed. SEEK IMMEDIATE MEDICAL CARE IF:   You have pain, redness, or swelling that gets worse or spreads to other parts of your face.  Your vision changes, or you have pain when looking at lights or moving objects.  You have a fever.  Your symptoms continue for longer than 2 to 4 days or become worse. MAKE SURE YOU:   Understand these instructions.  Will watch your condition.  Will get help right away if you are not doing well or get worse. Document Released: 08/11/2000 Document Revised: 11/06/2011 Document Reviewed: 09/21/2010 [facility] Patient Information 2014 [place], [place]. Bacterial Conjunctivitis Bacterial conjunctivitis, commonly called pink eye, is an  inflammation of the clear membrane that covers the white part of the eye (conjunctiva). The inflammation can also happen on the underside of the eyelids. The blood vessels in the conjunctiva become inflamed causing the eye to become red or pink. Bacterial conjunctivitis may spread easily from one eye to another and from person to person (contagious).  CAUSES  Bacterial conjunctivitis is caused by bacteria. The bacteria may come from your own skin, your upper respiratory tract, or from someone else with bacterial conjunctivitis. SYMPTOMS  The normally white color of the eye or the underside of the eyelid is usually pink or red. The pink eye is usually associated with irritation, tearing, and some sensitivity to light. Bacterial conjunctivitis is often associated with a thick, yellowish discharge from the eye. The discharge may turn into a crust on the eyelids overnight, which causes your eyelids to stick together. If a discharge is present, there may also be some blurred vision in the affected eye. DIAGNOSIS  Bacterial conjunctivitis is diagnosed by your caregiver through an eye exam and the symptoms that you report. Your caregiver looks for changes in the surface tissues of your eyes, which may point to the specific type of conjunctivitis. A sample of any discharge may be collected on a cotton-tip swab if you have a severe case of conjunctivitis, if your cornea is affected, or if you keep getting repeat infections that do not respond to treatment. The sample will be sent to a lab to see if the inflammation is caused by a bacterial infection and to see if  the infection will respond to antibiotic medicines. TREATMENT   Bacterial conjunctivitis is treated with antibiotics. Antibiotic eyedrops are most often used. However, antibiotic ointments are also available. Antibiotics pills are sometimes used. Artificial tears or eye washes may ease discomfort. HOME CARE INSTRUCTIONS   To ease discomfort, apply a  cool, clean wash cloth to your eye for 10 20 minutes, 3 4 times a day.  Gently wipe away any drainage from your eye with a warm, wet washcloth or a cotton ball.  Wash your hands often with soap and water. Use paper towels to dry your hands.  Do not share towels or wash cloths. This may spread the infection.  Change or wash your pillow case every day.  You should not use eye makeup until the infection is gone.  Do not operate machinery or drive if your vision is blurred.  Stop using contacts lenses. Ask your caregiver how to sterilize or replace your contacts before using them again. This depends on the type of contact lenses that you use.  When applying medicine to the infected eye, do not touch the edge of your eyelid with the eyedrop bottle or ointment tube. SEEK IMMEDIATE MEDICAL CARE IF:   Your infection has not improved within 3 days after beginning treatment.  You had yellow discharge from your eye and it returns.  You have increased eye pain.  Your eye redness is spreading.  Your vision becomes blurred.  You have a fever or persistent symptoms for more than 2 3 days.  You have a fever and your symptoms suddenly get worse.  You have facial pain, redness, or swelling. MAKE SURE YOU:   Understand these instructions.  Will watch your condition.  Will get help right away if you are not doing well or get worse. Document Released: 08/14/2005 Document Revised: 05/08/2012 Document Reviewed: 01/15/2012 [facility] Patient Information 2014 [place], [place].

## 2013-05-08 ENCOUNTER — Ambulatory Visit: Payer: BC Managed Care – PPO | Admitting: Family Medicine

## 2013-05-08 DIAGNOSIS — Z0289 Encounter for other administrative examinations: Secondary | ICD-10-CM

## 2013-06-28 ENCOUNTER — Telehealth: Payer: Self-pay | Admitting: Pulmonary Disease

## 2013-06-28 NOTE — Telephone Encounter (Signed)
Trinidee Schrag #[phone]  Oct 02, 1990 pt of DrYoung called 06/28/13 c/o asthma exac & requesting refill of Symbicort160- 2sp Bid... Symbicort 160 called into [facility] [place] at [phone]; no refill, told she will need ROV w/ drYoung before any further refills of asthma meds.Marland KitchenMarland Kitchen

## 2013-08-20 ENCOUNTER — Encounter (HOSPITAL_COMMUNITY): Payer: Self-pay | Admitting: Emergency Medicine

## 2013-08-20 ENCOUNTER — Observation Stay (HOSPITAL_COMMUNITY)
Admission: EM | Admit: 2013-08-20 | Discharge: 2013-08-22 | Disposition: A | Discharge status: Home / Self Care | Payer: BC Managed Care – PPO | Attending: Internal Medicine | Admitting: Internal Medicine

## 2013-08-20 ENCOUNTER — Emergency Department (HOSPITAL_COMMUNITY): Payer: BC Managed Care – PPO

## 2013-08-20 DIAGNOSIS — Z87891 Personal history of nicotine dependence: Secondary | ICD-10-CM | POA: Insufficient documentation

## 2013-08-20 DIAGNOSIS — J45909 Unspecified asthma, uncomplicated: Secondary | ICD-10-CM

## 2013-08-20 DIAGNOSIS — J309 Allergic rhinitis, unspecified: Secondary | ICD-10-CM

## 2013-08-20 DIAGNOSIS — J111 Influenza due to unidentified influenza virus with other respiratory manifestations: Principal | ICD-10-CM

## 2013-08-20 DIAGNOSIS — J45998 Other asthma: Secondary | ICD-10-CM

## 2013-08-20 DIAGNOSIS — R062 Wheezing: Secondary | ICD-10-CM

## 2013-08-20 DIAGNOSIS — Z0000 Encounter for general adult medical examination without abnormal findings: Secondary | ICD-10-CM

## 2013-08-20 DIAGNOSIS — N23 Unspecified renal colic: Secondary | ICD-10-CM

## 2013-08-20 DIAGNOSIS — N200 Calculus of kidney: Secondary | ICD-10-CM

## 2013-08-20 DIAGNOSIS — Z720 Tobacco use: Secondary | ICD-10-CM

## 2013-08-20 DIAGNOSIS — L259 Unspecified contact dermatitis, unspecified cause: Secondary | ICD-10-CM

## 2013-08-20 MED ORDER — ACETAMINOPHEN 500 MG PO TABS
1000.0000 mg | ORAL_TABLET | Freq: Once | ORAL | Status: AC
  Administered 2013-08-20: 1000 mg via ORAL
  Filled 2013-08-20: qty 2

## 2013-08-20 MED ORDER — ALBUTEROL SULFATE (5 MG/ML) 0.5% IN NEBU
5.0000 mg | INHALATION_SOLUTION | Freq: Once | RESPIRATORY_TRACT | Status: AC
  Administered 2013-08-20: 5 mg via RESPIRATORY_TRACT
  Filled 2013-08-20: qty 1

## 2013-08-20 NOTE — ED Notes (Signed)
Patient states that she has had SOB and trouble with her asthma x 1 week.

## 2013-08-21 DIAGNOSIS — J45909 Unspecified asthma, uncomplicated: Secondary | ICD-10-CM | POA: Diagnosis present

## 2013-08-21 DIAGNOSIS — J111 Influenza due to unidentified influenza virus with other respiratory manifestations: Secondary | ICD-10-CM | POA: Diagnosis present

## 2013-08-21 LAB — URINALYSIS W MICROSCOPIC + REFLEX CULTURE
Glucose, UA: NEGATIVE mg/dL
Hgb urine dipstick: NEGATIVE
Leukocytes, UA: NEGATIVE
Specific Gravity, Urine: 1.02 (ref 1.005–1.030)
Urobilinogen, UA: 1 mg/dL (ref 0.0–1.0)
pH: 6 (ref 5.0–8.0)

## 2013-08-21 LAB — BASIC METABOLIC PANEL
CO2: 24 mEq/L (ref 19–32)
Calcium: 8.8 mg/dL (ref 8.4–10.5)
Creatinine, Ser: 0.91 mg/dL (ref 0.50–1.10)
Glucose, Bld: 111 mg/dL — ABNORMAL HIGH (ref 70–99)

## 2013-08-21 LAB — INFLUENZA PANEL BY PCR (TYPE A & B)
H1N1 flu by pcr: NOT DETECTED
Influenza A By PCR: NEGATIVE
Influenza B By PCR: NEGATIVE

## 2013-08-21 LAB — CBC WITH DIFFERENTIAL/PLATELET
Basophils Absolute: 0 10*3/uL (ref 0.0–0.1)
Eosinophils Relative: 4 % (ref 0–5)
HCT: 39.7 % (ref 36.0–46.0)
Lymphocytes Relative: 10 % — ABNORMAL LOW (ref 12–46)
Lymphs Abs: 0.8 10*3/uL (ref 0.7–4.0)
MCH: 29.5 pg (ref 26.0–34.0)
MCV: 86.7 fL (ref 78.0–100.0)
Monocytes Absolute: 0.6 10*3/uL (ref 0.1–1.0)
RDW: 12.7 % (ref 11.5–15.5)
WBC: 8.1 10*3/uL (ref 4.0–10.5)

## 2013-08-21 MED ORDER — ALBUTEROL SULFATE (5 MG/ML) 0.5% IN NEBU
2.5000 mg | INHALATION_SOLUTION | RESPIRATORY_TRACT | Status: DC | PRN
  Administered 2013-08-21 – 2013-08-22 (×3): 2.5 mg via RESPIRATORY_TRACT
  Filled 2013-08-21 (×3): qty 0.5

## 2013-08-21 MED ORDER — PREDNISONE 50 MG PO TABS
60.0000 mg | ORAL_TABLET | Freq: Every day | ORAL | Status: DC
  Administered 2013-08-22: 60 mg via ORAL
  Filled 2013-08-21 (×3): qty 1

## 2013-08-21 MED ORDER — IPRATROPIUM BROMIDE 0.02 % IN SOLN
0.5000 mg | RESPIRATORY_TRACT | Status: DC
  Administered 2013-08-21 – 2013-08-22 (×8): 0.5 mg via RESPIRATORY_TRACT
  Filled 2013-08-21 (×8): qty 2.5

## 2013-08-21 MED ORDER — LEVOFLOXACIN IN D5W 750 MG/150ML IV SOLN
750.0000 mg | Freq: Once | INTRAVENOUS | Status: AC
  Administered 2013-08-21: 750 mg via INTRAVENOUS
  Filled 2013-08-21: qty 150

## 2013-08-21 MED ORDER — PNEUMOCOCCAL VAC POLYVALENT 25 MCG/0.5ML IJ INJ
0.5000 mL | INJECTION | INTRAMUSCULAR | Status: AC
  Administered 2013-08-22: 0.5 mL via INTRAMUSCULAR
  Filled 2013-08-21 (×2): qty 0.5

## 2013-08-21 MED ORDER — SODIUM CHLORIDE 0.9 % IV BOLUS (SEPSIS)
1000.0000 mL | Freq: Once | INTRAVENOUS | Status: AC
  Administered 2013-08-21: 1000 mL via INTRAVENOUS

## 2013-08-21 MED ORDER — ALBUTEROL (5 MG/ML) CONTINUOUS INHALATION SOLN
10.0000 mg/h | INHALATION_SOLUTION | Freq: Once | RESPIRATORY_TRACT | Status: AC
  Administered 2013-08-21: 10 mg/h via RESPIRATORY_TRACT
  Filled 2013-08-21: qty 20

## 2013-08-21 MED ORDER — INFLUENZA VAC SPLIT QUAD 0.5 ML IM SUSP
0.5000 mL | INTRAMUSCULAR | Status: AC
  Administered 2013-08-22: 0.5 mL via INTRAMUSCULAR
  Filled 2013-08-21 (×2): qty 0.5

## 2013-08-21 MED ORDER — OSELTAMIVIR PHOSPHATE 75 MG PO CAPS
75.0000 mg | ORAL_CAPSULE | Freq: Two times a day (BID) | ORAL | Status: DC
  Administered 2013-08-21: 75 mg via ORAL
  Filled 2013-08-21 (×3): qty 1

## 2013-08-21 MED ORDER — ALBUTEROL (5 MG/ML) CONTINUOUS INHALATION SOLN
10.0000 mg/h | INHALATION_SOLUTION | RESPIRATORY_TRACT | Status: DC
  Administered 2013-08-21: 10 mg/h via RESPIRATORY_TRACT
  Filled 2013-08-21: qty 20

## 2013-08-21 MED ORDER — ALBUTEROL SULFATE (5 MG/ML) 0.5% IN NEBU
2.5000 mg | INHALATION_SOLUTION | RESPIRATORY_TRACT | Status: DC
  Administered 2013-08-21 – 2013-08-22 (×8): 2.5 mg via RESPIRATORY_TRACT
  Filled 2013-08-21 (×8): qty 0.5

## 2013-08-21 MED ORDER — PREDNISONE 20 MG PO TABS
60.0000 mg | ORAL_TABLET | Freq: Once | ORAL | Status: AC
  Administered 2013-08-21: 60 mg via ORAL
  Filled 2013-08-21: qty 3

## 2013-08-21 MED ORDER — SODIUM CHLORIDE 0.9 % IV BOLUS (SEPSIS): 1000.0000 mL | Freq: Once | INTRAVENOUS | Status: DC

## 2013-08-21 MED ORDER — SODIUM CHLORIDE 0.9 % IJ SOLN
3.0000 mL | Freq: Two times a day (BID) | INTRAMUSCULAR | Status: DC
  Administered 2013-08-22: 3 mL via INTRAVENOUS

## 2013-08-21 MED ORDER — LEVOFLOXACIN IN D5W 750 MG/150ML IV SOLN
750.0000 mg | INTRAVENOUS | Status: DC
  Administered 2013-08-22: 06:00:00 750 mg via INTRAVENOUS
  Filled 2013-08-21: qty 150

## 2013-08-21 MED ORDER — ACETAMINOPHEN 500 MG PO TABS
500.0000 mg | ORAL_TABLET | Freq: Four times a day (QID) | ORAL | Status: DC | PRN
  Administered 2013-08-21 – 2013-08-22 (×3): 500 mg via ORAL
  Filled 2013-08-21 (×3): qty 1

## 2013-08-21 NOTE — ED Notes (Signed)
Pt ambulated with staff, O2 sats 94% on RM with a HR of 144. Pt sts she feels better.

## 2013-08-21 NOTE — ED Provider Notes (Signed)
CSN: [id number]     Arrival date & time 08/20/13  2240 History   First MD Initiated Contact with Patient 08/20/13 2252     Chief Complaint  Patient presents with  . Shortness of Breath   (Consider location/radiation/quality/duration/timing/severity/associated sxs/prior Treatment) HPI Comments: Patient is a 22 year old female with history of asthma who presents today with one week of progressive worsening shortness of breath. She reports that this feels like her asthma. She has been wheezing and had a productive cough. Cough is worse at night. She has been using her albuterol inhaler with little relief. She reports she just developed a fever today. She feels slightly improved after a nebulizer treatment here. She has associated generalized malaise.   The history is provided by the patient. No language interpreter was used.    Past Medical History  Diagnosis Date  . Allergic rhinitis   . Asthma     hosp 06/2009  . Eczema   . Kidney stone     right ER 09/17/09, 707/2012   Past Surgical History  Procedure Laterality Date  . Dental extractions     Family History  Problem Relation Age of Onset  . Asthma Mother   . Eczema Mother   . Asthma      uncle  . Eczema      uncle   History  Substance Use Topics  . Smoking status: Former Smoker    Types: Cigarettes  . Smokeless tobacco: Never Used  . Alcohol Use: No     Comment: wine, liquor,beer on weekends   OB History   Grav Para Term Preterm Abortions TAB SAB Ect Mult Living                 Review of Systems  Constitutional: Positive for fever, chills and fatigue.  Respiratory: Positive for cough, chest tightness, shortness of breath and wheezing.   Cardiovascular: Negative for chest pain.  Gastrointestinal: Negative for nausea, vomiting and abdominal pain.  All other systems reviewed and are negative.    Allergies  Latex; Peanut-containing drug products; Penicillins; and Shrimp  Home Medications   Current Outpatient  Rx  Name  Route  Sig  Dispense  Refill  . acetaminophen (TYLENOL) 500 MG tablet   Oral   Take 500 mg by mouth every 6 (six) hours as needed.         Marland Kitchen albuterol (PROVENTIL HFA;VENTOLIN HFA) 108 (90 BASE) MCG/ACT inhaler   Inhalation   Inhale 2 puffs into the lungs every 6 (six) hours as needed for wheezing or shortness of breath.         Marland Kitchen albuterol (PROVENTIL) (2.5 MG/3ML) 0.083% nebulizer solution      USE ONE VIAL IN NEBULIZER EVERY 6 HOURS AS NEEDED   75 mL   2   . clobetasol ointment (TEMOVATE) 0.05 %   Topical   Apply 1 application topically 2 (two) times daily as needed (for eczema).         . desloratadine-pseudoephedrine (CLARINEX-D 12 HOUR) 2.5-120 MG per tablet   Oral   Take 1 tablet by mouth daily as needed (for allergies).          . diphenhydrAMINE (BENADRYL) 25 MG tablet   Oral   Take 25 mg by mouth every 6 (six) hours as needed for allergies.          Marland Kitchen DM-Doxylamine-Acetaminophen 15-6.25-325 MG/15ML LIQD   Oral   Take 15 mLs by mouth at bedtime as needed.  BP 129/77  Pulse 144  Temp(Src) 98.7 F (37.1 C) (Oral)  Resp 21  Ht 5\' 9"  (1.753 m)  Wt 225 lb (102.059 kg)  BMI 33.21 kg/m2  SpO2 92%  LMP 07/29/2013 Physical Exam  Nursing note and vitals reviewed. Constitutional: She is oriented to person, place, and time. She appears well-developed and well-nourished. No distress.  HENT:  Head: Normocephalic and atraumatic.  Right Ear: External ear normal.  Left Ear: External ear normal.  Nose: Nose normal.  Mouth/Throat: Oropharynx is clear and moist.  Eyes: Conjunctivae are normal.  Neck: Normal range of motion.  Cardiovascular: Normal rate, regular rhythm and normal heart sounds.   Pulmonary/Chest: Effort normal. No stridor. No respiratory distress. She has wheezes (inspiratory and expiratory). She has no rales.  Speaking in full sentences  Abdominal: Soft. She exhibits no distension.  Musculoskeletal: Normal range of motion.   Neurological: She is alert and oriented to person, place, and time. She has normal strength.  Skin: Skin is warm and dry. She is not diaphoretic. No erythema.  Psychiatric: She has a normal mood and affect. Her behavior is normal.    ED Course  Procedures (including critical care time) Labs Review Labs Reviewed  CBC WITH DIFFERENTIAL - Abnormal; Notable for the following:    Neutrophils Relative % 78 (*)    Lymphocytes Relative 10 (*)    All other components within normal limits  BASIC METABOLIC PANEL - Abnormal; Notable for the following:    Glucose, Bld 111 (*)    GFR calc non Af Amer 89 (*)    All other components within normal limits  INFLUENZA PANEL BY PCR   Imaging Review Dg Chest 2 View (if Patient Has Fever And/or Copd)  08/20/2013   CLINICAL DATA:  Cold symptoms for 1 week.  Fever and chest pain.  EXAM: CHEST  2 VIEW  COMPARISON:  None.  FINDINGS: Cardiopericardial silhouette within normal limits. Mediastinal contours normal. Trachea midline. No airspace disease or effusion.  IMPRESSION: No active cardiopulmonary disease.   Electronically Signed   By: Andreas Newport M.D.   On: 08/20/2013 23:22    EKG Interpretation   None       MDM   1. Asthma   2. Influenza-like illness    Patient presents with worsening wheezing over the past week. In the emergency department she was found to have a fever of 101.45F as well as tachycardia to the 140s. She has inspiratory and expiratory wheezes after 2 continuous nebulizer treatments. Her oxygen remained at 94% on room air with ambulation, but she was tachycardic in the 140s. She does not feel as though she is at her baseline breathing. Lab work is generally unremarkable. The patient was started on Levaquin IV. Patient will be admitted to medicine for further treatment of her asthma. Discussed case with Dr. Ranae Palms who agrees with plan. Patient / Family / Caregiver informed of clinical course, understand medical decision-making  process, and agree with plan.     Mora Bellman, PA-C 08/21/13 [phone]

## 2013-08-21 NOTE — ED Notes (Signed)
Insp/Exp wheezing still present to bilateral lung fields Patient states that she does not feel like she is at her baseline RR WNL--even and unlabored with equal rise and fall of chest Patient appears in NAD PIV started and patient medicated, see MAR RT at bedside to give neb tx

## 2013-08-21 NOTE — H&P (Signed)
[facility] History and Physical  [name] [name] AVW:[id number] DOB: 1991-07-06 DOA: 08/20/2013  Referring physician: EDP PCP: Ruthe Mannan, MD   Chief Complaint: Cough, Fever   HPI: [name] [name] is a 22 y.o. female h/o asthma, who presents to the ED with cough, wheezing, SOB, and fever.  Fever is documented in the ED at 103.3 and improved with tylenol.  Her SOB is improved with steroids and breathing treatments (although the latter has made her tachycardic).  The patient also endorses diffuse muscle aches, headache, and not having had her flu shot this year.  SOB had been going on for 1 week, but fever is new onset today she states.  Review of Systems: Systems reviewed.  As above, otherwise negative  Past Medical History  Diagnosis Date  . Allergic rhinitis   . Asthma     hosp 06/2009  . Eczema   . Kidney stone     right ER 09/17/09, 707/2012   Past Surgical History  Procedure Laterality Date  . Dental extractions     Social History:  reports that she has quit smoking. Her smoking use included Cigarettes. She smoked 0.00 packs per day. She has never used smokeless tobacco. She reports that she does not drink alcohol. Her drug history is not on file.  Allergies  Allergen Reactions  . Latex Hives  . Peanut-Containing Drug Products     REACTION: unspecified  . Penicillins Hives  . Shrimp [Shellfish Allergy] Hives    Family History  Problem Relation Age of Onset  . Asthma Mother   . Eczema Mother   . Asthma      uncle  . Eczema      uncle     Prior to Admission medications   Medication Sig Start Date End Date Taking? Authorizing Provider  acetaminophen (TYLENOL) 500 MG tablet Take 500 mg by mouth every 6 (six) hours as needed.   Yes Historical Provider, MD  albuterol (PROVENTIL HFA;VENTOLIN HFA) 108 (90 BASE) MCG/ACT inhaler Inhale 2 puffs into the lungs every 6 (six) hours as needed for wheezing or shortness of breath.   Yes Historical Provider, MD   albuterol (PROVENTIL) (2.5 MG/3ML) 0.083% nebulizer solution USE ONE VIAL IN NEBULIZER EVERY 6 HOURS AS NEEDED 04/19/13  Yes Waymon Budge, MD  clobetasol ointment (TEMOVATE) 0.05 % Apply 1 application topically 2 (two) times daily as needed (for eczema).   Yes Historical Provider, MD  desloratadine-pseudoephedrine (CLARINEX-D 12 HOUR) 2.5-120 MG per tablet Take 1 tablet by mouth daily as needed (for allergies).    Yes Historical Provider, MD  diphenhydrAMINE (BENADRYL) 25 MG tablet Take 25 mg by mouth every 6 (six) hours as needed for allergies.    Yes Historical Provider, MD  DM-Doxylamine-Acetaminophen 15-6.25-325 MG/15ML LIQD Take 15 mLs by mouth at bedtime as needed.   Yes Historical Provider, MD   Physical Exam: Filed Vitals:   08/21/13 0432  BP: 129/77  Pulse:   Temp:   Resp: 21    BP 129/77  Pulse 144  Temp(Src) 98.7 F (37.1 C) (Oral)  Resp 21  Ht 5\' 9"  (1.753 m)  Wt 102.059 kg (225 lb)  BMI 33.21 kg/m2  SpO2 92%  LMP 07/29/2013  General Appearance:    Alert, oriented, no distress, appears stated age  Head:    Normocephalic, atraumatic  Eyes:    PERRL, EOMI, sclera non-icteric        Nose:   Nares without drainage or epistaxis. Mucosa, turbinates normal  Throat:   Moist mucous membranes. Oropharynx without erythema or exudate.  Neck:   Supple. No carotid bruits.  No thyromegaly.  No lymphadenopathy.   Back:     No CVA tenderness, no spinal tenderness  Lungs:     Diffuse wheezes bilaterally  Chest wall:    No tenderness to palpitation  Heart:    Regular rate and rhythm without murmurs, gallops, rubs  Abdomen:     Soft, non-tender, nondistended, normal bowel sounds, no organomegaly  Genitalia:    deferred  Rectal:    deferred  Extremities:   No clubbing, cyanosis or edema.  Pulses:   2+ and symmetric all extremities  Skin:   Skin color, texture, turgor normal, no rashes or lesions  Lymph nodes:   Cervical, supraclavicular, and axillary nodes normal  Neurologic:    CNII-XII intact. Normal strength, sensation and reflexes      throughout    Labs on Admission:  Basic Metabolic Panel:  Recent Labs Lab 08/21/13 0425  NA 137  K 3.9  CL 102  CO2 24  GLUCOSE 111*  BUN 10  CREATININE 0.91  CALCIUM 8.8   Liver Function Tests: No results found for this basename: AST, ALT, ALKPHOS, BILITOT, PROT, ALBUMIN,  in the last 168 hours No results found for this basename: LIPASE, AMYLASE,  in the last 168 hours No results found for this basename: AMMONIA,  in the last 168 hours CBC:  Recent Labs Lab 08/21/13 0425  WBC 8.1  NEUTROABS 6.4  HGB 13.5  HCT 39.7  MCV 86.7  PLT 288   Cardiac Enzymes: No results found for this basename: CKTOTAL, CKMB, CKMBINDEX, TROPONINI,  in the last 168 hours  BNP (last 3 results) No results found for this basename: PROBNP,  in the last 8760 hours CBG: No results found for this basename: GLUCAP,  in the last 168 hours  Radiological Exams on Admission: Dg Chest 2 View (if Patient Has Fever And/or Copd)  08/20/2013   CLINICAL DATA:  Cold symptoms for 1 week.  Fever and chest pain.  EXAM: CHEST  2 VIEW  COMPARISON:  None.  FINDINGS: Cardiopericardial silhouette within normal limits. Mediastinal contours normal. Trachea midline. No airspace disease or effusion.  IMPRESSION: No active cardiopulmonary disease.   Electronically Signed   By: Andreas Newport M.D.   On: 08/20/2013 23:22    EKG: Independently reviewed.  Assessment/Plan Principal Problem:   Influenza-like illness Active Problems:   Asthma   1. ILI - given SIRS with tachycardia and fever of 103.3 treating with tamiflu for possible influenza, as well as levaquin for empiric treatment of other bacterial pathogens.  Influenza PCR pending. 2. Asthma - adult wheeze protocol, steroids   Code Status: Full  Family Communication: No family in room Disposition Plan: Admit to obs   Time spent: 50 min  GARDNER, JARED M. [facility] Pager  [phone]  If 7AM-7PM, please contact the day team taking care of the patient [email or web address] Password [facility] 08/21/2013, 5:46 AM

## 2013-08-22 ENCOUNTER — Observation Stay (HOSPITAL_COMMUNITY): Payer: BC Managed Care – PPO

## 2013-08-22 DIAGNOSIS — R062 Wheezing: Secondary | ICD-10-CM

## 2013-08-22 DIAGNOSIS — F17200 Nicotine dependence, unspecified, uncomplicated: Secondary | ICD-10-CM

## 2013-08-22 LAB — BASIC METABOLIC PANEL
CO2: 24 mEq/L (ref 19–32)
Calcium: 8.7 mg/dL (ref 8.4–10.5)
Chloride: 104 mEq/L (ref 96–112)
GFR calc Af Amer: >90 mL/min (ref >90)
Glucose, Bld: 92 mg/dL (ref 70–99)
Potassium: 3.8 mEq/L (ref 3.5–5.1)
Sodium: 137 mEq/L (ref 135–145)

## 2013-08-22 LAB — CBC
HCT: 39.5 % (ref 36.0–46.0)
Hemoglobin: 13.3 g/dL (ref 12.0–15.0)
MCH: 29.5 pg (ref 26.0–34.0)
Platelets: 286 10*3/uL (ref 150–400)
RBC: 4.51 MIL/uL (ref 3.87–5.11)
WBC: 7 10*3/uL (ref 4.0–10.5)

## 2013-08-22 MED ORDER — LEVOFLOXACIN 750 MG PO TABS: 750.0000 mg | ORAL_TABLET | Freq: Every day | ORAL | Status: DC

## 2013-08-22 MED ORDER — PREDNISONE 10 MG PO TABS: ORAL_TABLET | ORAL | Status: DC

## 2013-08-22 NOTE — ED Provider Notes (Signed)
Medical screening examination/treatment/procedure(s) were performed by non-physician practitioner and as supervising physician I was immediately available for consultation/collaboration.   Emerita Berkemeier, MD 08/22/13 0310 

## 2013-08-22 NOTE — Discharge Summary (Signed)
Physician Discharge Summary  [name] [name] ZOX:[id number] DOB: 06-10-1991 DOA: 08/20/2013  PCP: Ruthe Mannan, MD  Admit date: 08/20/2013 Discharge date: 08/22/2013  Time spent: 35 minutes  Recommendations for Outpatient Follow-up:  1. Stop smoking  Discharge Diagnoses:  Principal Problem:   Influenza-like illness Active Problems:   Asthma   Discharge Condition:  improved  Diet recommendation: regular  Filed Weights   08/20/13 2247 08/21/13 0643  Weight: 102.059 kg (225 lb) 96.525 kg (212 lb 12.8 oz)    History of present illness:  [name] [name] is a 22 y.o. female h/o asthma, who presents to the ED with cough, wheezing, SOB, and fever. Fever is documented in the ED at 103.3 and improved with tylenol. Her SOB is improved with steroids and breathing treatments (although the latter has made her tachycardic). The patient also endorses diffuse muscle aches, headache, and not having had her flu shot this year. SOB had been going on for 1 week, but fever is new onset today she states.      Hospital Course:  Flu negative- patient improved on prednisone and levaquin- wanted to go home  Procedures:  none  Consultations:  none  Discharge Exam: Filed Vitals:   08/22/13 0547  BP: 108/70  Pulse: 67  Temp: 98.3 F (36.8 C)  Resp: 20    General: A+Ox3, NAD Cardiovascular: rrr Respiratory: occasional wheezing  Discharge Instructions      Discharge Orders   Future Orders Complete By Expires   Diet general  As directed    Discharge instructions  As directed    Comments:     Stop smoking   Increase activity slowly  As directed        Medication List    STOP taking these medications       EPINEPHrine 0.3 mg/0.3 mL Devi  Commonly known as:  EPIPEN      TAKE these medications       acetaminophen 500 MG tablet  Commonly known as:  TYLENOL  Take 500 mg by mouth every 6 (six) hours as needed.     albuterol 108 (90 BASE) MCG/ACT inhaler  Commonly  known as:  PROVENTIL HFA;VENTOLIN HFA  Inhale 2 puffs into the lungs every 6 (six) hours as needed for wheezing or shortness of breath.     albuterol (2.5 MG/3ML) 0.083% nebulizer solution  Commonly known as:  PROVENTIL  USE ONE VIAL IN NEBULIZER EVERY 6 HOURS AS NEEDED     CLARINEX-D 12 HOUR 2.5-120 MG per tablet  Generic drug:  desloratadine-pseudoephedrine  Take 1 tablet by mouth daily as needed (for allergies).     clobetasol ointment 0.05 %  Commonly known as:  TEMOVATE  Apply 1 application topically 2 (two) times daily as needed (for eczema).     diphenhydrAMINE 25 MG tablet  Commonly known as:  BENADRYL  Take 25 mg by mouth every 6 (six) hours as needed for allergies.     DM-Doxylamine-Acetaminophen 15-6.25-325 MG/15ML Liqd  Take 15 mLs by mouth at bedtime as needed.     levofloxacin 750 MG tablet  Commonly known as:  LEVAQUIN  Take 1 tablet (750 mg total) by mouth daily.     predniSONE 10 MG tablet  Commonly known as:  DELTASONE  40 mg x 2 days, 30 mg x 2 days, 20 mg x 2 days, 10 mg x 2 days       Allergies  Allergen Reactions  . Latex Hives  . Peanut-Containing Drug Products  REACTION: unspecified  . Penicillins Hives  . Shrimp [Shellfish Allergy] Hives      The results of significant diagnostics from this hospitalization (including imaging, microbiology, ancillary and laboratory) are listed below for reference.    Significant Diagnostic Studies: Dg Chest 2 View  08/22/2013   CLINICAL DATA:  Cough and fever  EXAM: CHEST  2 VIEW  COMPARISON:  August 20, 2013  FINDINGS: Lungs are clear. Heart size and pulmonary vascularity are normal. No adenopathy. No bone lesions.  IMPRESSION: No abnormality noted.   Electronically Signed   By: Bretta Bang M.D.   On: 08/22/2013 08:29   Dg Chest 2 View (if Patient Has Fever And/or Copd)  08/20/2013   CLINICAL DATA:  Cold symptoms for 1 week.  Fever and chest pain.  EXAM: CHEST  2 VIEW  COMPARISON:  None.   FINDINGS: Cardiopericardial silhouette within normal limits. Mediastinal contours normal. Trachea midline. No airspace disease or effusion.  IMPRESSION: No active cardiopulmonary disease.   Electronically Signed   By: Andreas Newport M.D.   On: 08/20/2013 23:22    Microbiology: No results found for this or any previous visit (from the past 240 hour(s)).   Labs: Basic Metabolic Panel:  Recent Labs Lab 08/21/13 0425 08/22/13 0445  NA 137 137  K 3.9 3.8  CL 102 104  CO2 24 24  GLUCOSE 111* 92  BUN 10 11  CREATININE 0.91 0.86  CALCIUM 8.8 8.7   Liver Function Tests: No results found for this basename: AST, ALT, ALKPHOS, BILITOT, PROT, ALBUMIN,  in the last 168 hours No results found for this basename: LIPASE, AMYLASE,  in the last 168 hours No results found for this basename: AMMONIA,  in the last 168 hours CBC:  Recent Labs Lab 08/21/13 0425 08/22/13 0445  WBC 8.1 7.0  NEUTROABS 6.4  --   HGB 13.5 13.3  HCT 39.7 39.5  MCV 86.7 87.6  PLT 288 286   Cardiac Enzymes: No results found for this basename: CKTOTAL, CKMB, CKMBINDEX, TROPONINI,  in the last 168 hours BNP: BNP (last 3 results) No results found for this basename: PROBNP,  in the last 8760 hours CBG: No results found for this basename: GLUCAP,  in the last 168 hours     Signed:  Marlin Canary  [facility] 08/22/2013, 12:37 PM

## 2013-08-22 NOTE — Care Management Note (Signed)
    Page 1 of 1   08/22/2013     12:37:18 PM   CARE MANAGEMENT NOTE 08/22/2013  Patient:  [name] [name], [name] [name]   Account Number:  [id number]  Date Initiated:  08/22/2013  Documentation initiated by:  Lanier Clam  Subjective/Objective Assessment:   22 Y/O F ADMITTED W/INFLUENZA.     Action/Plan:   FROM HOME.   Anticipated DC Date:  08/22/2013   Anticipated DC Plan:  HOME/SELF CARE      DC Planning Services  CM consult      Choice offered to / List presented to:             Status of service:  Completed, signed off Medicare Important Message given?   (If response is "NO", the following Medicare IM given date fields will be blank) Date Medicare IM given:   Date Additional Medicare IM given:    Discharge Disposition:  HOME/SELF CARE  Per UR Regulation:  Reviewed for med. necessity/level of care/duration of stay  If discussed at Long Length of Stay Meetings, dates discussed:    Comments:

## 2013-09-14 ENCOUNTER — Other Ambulatory Visit: Payer: Self-pay | Admitting: Internal Medicine

## 2013-09-17 ENCOUNTER — Telehealth: Payer: Self-pay | Admitting: Internal Medicine

## 2013-09-17 MED ORDER — CLOBETASOL PROPIONATE 0.05 % EX OINT: 1.0000 "application " | TOPICAL_OINTMENT | Freq: Two times a day (BID) | CUTANEOUS | Status: DC | PRN

## 2013-09-17 NOTE — Telephone Encounter (Signed)
Ok to refill clobetasol the way it was written before.

## 2013-09-17 NOTE — Telephone Encounter (Signed)
Last OV 08/20/12 No pending OV I don't see in her chart where we have prescribed this medication to her, Dr. Dayton MartesAron is the only prescriber.  CY- please advise. Thanks.

## 2013-09-17 NOTE — Telephone Encounter (Signed)
Called spoke with patient, advised CDY okayed for the refill on the Clobetasol ointment.  [facility].  Pt verbalized her understanding and denied any further needs at this time.

## 2013-12-13 NOTE — ED Notes (Signed)
Diminished breath sounds throughout with scatter wheezing.

## 2013-12-13 NOTE — ED Notes (Signed)
Patient states she is having an asthma attack and she does not have her inhaler with her./

## 2013-12-14 MED ORDER — ALBUTEROL SULFATE 0.083 % (0.83 MG/ML) SOLN FOR INHALATION
2.5 mg /3 mL (0.083 %) | RESPIRATORY_TRACT | Status: AC
Start: 2013-12-14 — End: 2013-12-13
  Administered 2013-12-14: 03:00:00 via RESPIRATORY_TRACT

## 2013-12-14 MED ORDER — PREDNISONE 5 MG TABLETS IN A DOSE PACK
5 mg | ORAL_TABLET | ORAL | Status: AC
Start: 2013-12-14 — End: ?

## 2013-12-14 MED ORDER — IPRATROPIUM-ALBUTEROL 2.5 MG-0.5 MG/3 ML NEB SOLUTION
2.5 mg-0.5 mg/3 ml | RESPIRATORY_TRACT | Status: AC
Start: 2013-12-14 — End: 2013-12-13
  Administered 2013-12-14: 03:00:00 via RESPIRATORY_TRACT

## 2013-12-14 MED ORDER — ALBUTEROL SULFATE HFA 90 MCG/ACTUATION AEROSOL INHALER
90 mcg/actuation | RESPIRATORY_TRACT | Status: AC | PRN
Start: 2013-12-14 — End: ?

## 2013-12-14 MED ORDER — PREDNISONE 20 MG TAB
20 mg | ORAL | Status: AC
Start: 2013-12-14 — End: 2013-12-14
  Administered 2013-12-14: 05:00:00 via ORAL

## 2013-12-14 MED ORDER — BUDESONIDE-FORMOTEROL HFA 160 MCG-4.5 MCG/ACTUATION AEROSOL INHALER
Freq: Two times a day (BID) | RESPIRATORY_TRACT | Status: AC
Start: 2013-12-14 — End: ?

## 2013-12-14 MED FILL — PREDNISONE 20 MG TAB: 20 mg | ORAL | Qty: 3

## 2013-12-14 MED FILL — IPRATROPIUM-ALBUTEROL 2.5 MG-0.5 MG/3 ML NEB SOLUTION: 2.5 mg-0.5 mg/3 ml | RESPIRATORY_TRACT | Qty: 3

## 2013-12-14 MED FILL — ALBUTEROL SULFATE 0.083 % (0.83 MG/ML) SOLN FOR INHALATION: 2.5 mg /3 mL (0.083 %) | RESPIRATORY_TRACT | Qty: 1

## 2013-12-14 NOTE — ED Notes (Signed)
Pt reports wheezing PTA and ran out of inhaler.  +smoker and h/o asthma.  Pt rec'd duoneb prior to arrival in room.  Pt reports feeling better, faint exp wheezing present to right upper lobe.  Pt awaiting provider evaluation.

## 2013-12-14 NOTE — ED Provider Notes (Signed)
[place]  [facility]      23 y.o. female with noted past medical history who presents to the emergency department c/o wheezing and chest tightness that are worsening gradually today. [place] [place] [place], forgot her inhalers, Symbicort 160/4.5 and Albuterol inhaler and is feeling her asthma is flaring up. States, Prednisone always works. Denies fevers, cold-like symptoms, CP, or any other complaints.   History of hospitalization/intubation: no/no  Exposure to smoking: yes      Past Medical History   Diagnosis Date   ??? Asthma    ??? Eczema        Allergies   Allergen Reactions   ??? Pcn [Penicillins] Hives       History     Social History   ??? Marital Status: SINGLE     Spouse Name: N/A     Number of Children: N/A   ??? Years of Education: N/A     Occupational History   ??? Not on file.     Social History Main Topics   ??? Smoking status: Current Every Day Smoker -- 0.25 packs/day   ??? Smokeless tobacco: Not on file   ??? Alcohol Use: Not on file   ??? Drug Use: Not on file   ??? Sexual Activity: Not on file     Other Topics Concern   ??? Not on file     Social History Narrative       REVIEW OF SYSTEMS:  Constitutional:  Negative for fever, chills, diaphoresis.  HENT:  Negative for congestion, ear pain, sore throat, rhinorrhea, neck pain/stiffness.    Respiratory:  + wheezing, chest tightness, Negative for stridor or cough.    Cardiovascular:  Negative for chest pain or palpitations.  Gastrointestinal:  Negative for abd pain, nausea, vomiting or diarrhea.  Genitourinary:  Negative.   Musculoskeletal:  Negative.   Skin:  Negative for cyanosis or pallor.   Neurological: Negative.    All other systems are negative    Visit Vitals   Item Reading   ??? Pulse 74   ??? Temp 98 ??F (36.7 ??C)   ??? Resp 22   ??? Ht 5\' 8"  (1.727 m)   ??? Wt 99.791 kg (220 lb)   ??? BMI 33.46 kg/m2   ??? SpO2 100%       PHYSICAL EXAM:    Nursing notes and vital signs are reviewed    CONSTITUTIONAL:  Alert, in no apparent distress;   well developed;  well nourished.  HEAD:  Normocephalic, atraumatic.  EYES:  EOMI.  Non-icteric sclera.  Normal conjunctiva.  ENTM:  Nose:  no rhinorrhea.  Throat:  no erythema or exudate, mucous membranes moist. Ears: normal TM's bilaterally  NECK:  No JVD.  Supple, No rigidity, erythema, edema, muscular or midline tenderness     RESPIRATORY:  Faint expiratory wheezes, more on the right,  but good air movement.  No rales or rhonchi.  No cough in ED    CARDIOVASCULAR:  Regular rate and rhythm.  No murmurs, rubs, or gallops.  GI:  Normal bowel sounds, abdomen soft and non-tender.   MS:  Grossly normal inspection. No edema, no calf tenderness.  Distal pulses intact.  NEURO:  Moves all four extremities, and grossly normal motor exam.  SKIN:  No rashes;  Normal for age.  PSYCH:  Alert and normal affect.      Abnormal lab results from this emergency department encounter:  Labs Reviewed - No data  to display    Lab values for this patient within approximately the last 12 hours:  No results found for this or any previous visit (from the past 12 hour(s)).    Radiologist and cardiologist interpretations if available at time of this note:  No orders to display       Medication(s) ordered for patient during this emergency visit encounter:  Medications   predniSONE (DELTASONE) tablet 60 mg (not administered)   albuterol-ipratropium (DUO-NEB) 2.5 MG-0.5 MG/3 ML (3 mL Nebulization Given 12/13/13 2311)   albuterol (PROVENTIL VENTOLIN) nebulizer solution 2.5 mg (2.5 mg Nebulization Given 12/13/13 2311)       DDx:  viral vs bacterial URI, asthma exacerbation, COPD, bronchitis, PNE, PE, allergies, pneumothorax, bronchospasm, bronchoconstriction     ED COURSE:  Patient's breathing improved and wheezing resolved in the emergency department with the noted albuterol and atrovent HHN treatments.  Patient's initial steroid therapy may have been started in the ED as well.  Patient is well and can be discharged home.     IMPRESSION AND MEDICAL  DECISION MAKING:  Based upon the patient's presentation with noted HPI and PE, along with the work up done in the emergency department, I believe that the patient is having an acute asthma exacerbation in an asthmatic patient. No systemic s/s, no ABX needed. Plan: Prednisone, refill inhalers. Pt will f/u with PCP in 2-3 days at home or will return to ED here for s/s of emergency that were discussed. Pt and her fam member agree to plan.   Oddis Westling, PA 12:49 AM       SPECIFIC PATIENT INSTRUCTIONS FROM THE PROVIDER  WHO TREATED YOU IN THE ER TODAY:    Prednisone as prescribed until finished.  Use your albuterol inhaler and Symbicort, if prescribed, and/or home nebulizer machine (if you have one) for wheezing.   Avoid triggers if possible.  DO NOT smoke  FOLLOW UP APPOINTMENT:  Your primary doctor in 2 days.    Return if any concerns or worsening of condition(s)      Diagnosis:   1. Asthma with exacerbation, unspecified asthma severity    2. Medication refill          Disposition: home    Follow-up Information    Follow up With Details Comments Contact Info    your primary care provider In 3 days for recheck of your asthma symptoms your hometown    [facility]  As needed, If symptoms worsen [address]  [place] [id number]  [phone]          Current Discharge Medication List      START taking these medications    Details   predniSONE (STERAPRED) 5 mg dose pack See administration instruction per 5mg  dose pack  Qty: 21 Tab, Refills: 0      albuterol (PROAIR HFA) 90 mcg/actuation inhaler Take 1-2 Puffs by inhalation every four (4) hours as needed for Wheezing.  Qty: 1 Inhaler, Refills: 0      budesonide-formoterol (SYMBICORT) 160-4.5 mcg/actuation HFA inhaler Take 2 Puffs by inhalation two (2) times a day.  Qty: 1 Inhaler, Refills: 0

## 2013-12-14 NOTE — ED Notes (Signed)
Patient has received discharge instructions.  Patient verbalizes understanding of discharge instructions.  Patient denies further questions.  Patient armband removed and shredded

## 2014-01-13 ENCOUNTER — Other Ambulatory Visit: Payer: Self-pay | Admitting: Pulmonary Disease

## 2014-01-14 ENCOUNTER — Telehealth: Payer: Self-pay | Admitting: Internal Medicine

## 2014-01-14 MED ORDER — BUDESONIDE-FORMOTEROL FUMARATE 160-4.5 MCG/ACT IN AERO: 2.0000 | INHALATION_SPRAY | Freq: Two times a day (BID) | RESPIRATORY_TRACT | Status: DC

## 2014-01-14 NOTE — Telephone Encounter (Signed)
Spoke with pt. Aware RX has been sent and must keep pending appt. Nothing further needed

## 2014-01-14 NOTE — Telephone Encounter (Signed)
Refill request for Symbicort.  This is not an active medication on pt's med list and she has not been seen in the office since 2013.  Pt will need to contact the office regarding her medications, refills and need for follow up.

## 2014-01-17 ENCOUNTER — Other Ambulatory Visit: Payer: Self-pay | Admitting: Internal Medicine

## 2014-01-20 ENCOUNTER — Encounter: Payer: Self-pay | Admitting: Internal Medicine

## 2014-01-20 ENCOUNTER — Ambulatory Visit (INDEPENDENT_AMBULATORY_CARE_PROVIDER_SITE_OTHER): Payer: BC Managed Care – PPO | Admitting: Internal Medicine

## 2014-01-20 VITALS — BP 122/76 | HR 81 | Ht 69.0 in | Wt 221.4 lb

## 2014-01-20 DIAGNOSIS — Z91018 Allergy to other foods: Secondary | ICD-10-CM

## 2014-01-20 DIAGNOSIS — L259 Unspecified contact dermatitis, unspecified cause: Secondary | ICD-10-CM

## 2014-01-20 DIAGNOSIS — J45909 Unspecified asthma, uncomplicated: Secondary | ICD-10-CM

## 2014-01-20 DIAGNOSIS — J45998 Other asthma: Secondary | ICD-10-CM

## 2014-01-20 MED ORDER — DOXYCYCLINE HYCLATE 100 MG PO TABS: ORAL_TABLET | ORAL | Status: DC

## 2014-01-20 MED ORDER — ALBUTEROL SULFATE (2.5 MG/3ML) 0.083% IN NEBU: INHALATION_SOLUTION | RESPIRATORY_TRACT | Status: DC

## 2014-01-20 MED ORDER — CLOBETASOL PROPIONATE 0.05 % EX OINT: TOPICAL_OINTMENT | Freq: Two times a day (BID) | CUTANEOUS | Status: DC

## 2014-01-20 MED ORDER — ALBUTEROL SULFATE HFA 108 (90 BASE) MCG/ACT IN AERS: 2.0000 | INHALATION_SPRAY | Freq: Four times a day (QID) | RESPIRATORY_TRACT | Status: DC | PRN

## 2014-01-20 NOTE — Progress Notes (Signed)
Patient ID: [name] [name], female    DOB: 1991/05/04, 23 y.o.   MRN: [id number]  HPI 05/03/11-23 year old never smoking female followed for severe chronic eczema, asthma, allergic rhinitis..... Mother here Last here September 08, 2010- allergy skin test positive. Has been sweating more in recent hot months. With that has had impetiginous lesions on legs and arms. especially in areas of severe eczema.   Wheeziing more in last 2 weeks-sometimes twice daily. Using her rescue inhaler or nebulizer 3-4 times daily but not using Qvar regularly. Mother says history of penicillin was rash  to palms and soles 18 years ago. Nasal congestion. Uses mostly her rescue inhaler.  She feels rhinitis and asthma are controlled but we discussed recognition of exacerbation and maintenance use of her steroid inhaler.  11/17/11- 23 year old never smoking female followed for severe chronic eczema, asthma, allergic rhinitis  she reports a good winter with no significant respiratory infections. Needs medication refills but denies new problems. Using Flonase daily. Admits to social smoking occasionally-we discussed this and I asked her to quit now while she can.  She asks refill of her eczema cream- we discussed again.  08/20/12- 23 year old never smoking female followed for severe chronic eczema, asthma, allergic rhinitis FOLLOWS FOR: patient states had asthma problems/cold x2 weeks ago-- denies any other complaints at this time--would like to do allergy test She took prednisone for asthma with a cold 2 weeks ago. Last prior use of prednisone was almost a year ago. Otherwise has done well. Shrimp has been a bad trigger in the past. She ate some shrimp one or 2 months ago by mistake but had no symptoms. She wonders what her food allergy status is. Occasionally eats tomato, pizza and pineapple, all of which use to cause flare of her eczema and asthma. She still keeps an EpiPen. CXR 03/04/11 IMPRESSION:  Mild peribronchial  cuffing suggests reactive airway disease. No  consolidation.  Original Report Authenticated By: [name] [name], M.D.    01/20/14- 23 year old never smoking female followed for severe chronic eczema, asthma, allergic rhinitis FOLLOWS FOR: was in hospital for 3 days this past December due to "bad" asthma flare up. Has been well since that hospital stay. Likes Symbicort and rarely needing a rescue inhaler. Some nasal stuffiness manage with Claritin. We discussed her chronic eczema which has been better controlled. Discussed MRSA. Seafood allergy panel 08/20/2012 reviewed-broadly elevated for all seafoods on the panel. She can usually eat fish.  ROS-see HPI Constitutional:   No-   weight loss, night sweats, fevers, chills, fatigue, lassitude. HEENT:   No-  headaches, difficulty swallowing, tooth/dental problems, sore throat,       Controlled sneezing, itching, ear ache, nasal congestion, post nasal drip,  CV:  No-   chest pain, orthopnea, PND, swelling in lower extremities, anasarca,  dizziness, palpitations Resp: No-   shortness of breath with exertion or at rest.              No-   productive cough,  No non-productive cough,  No- coughing up of blood.            No-   change in color of mucus.  No- wheezing.   Skin: No- acute  rash or lesions. GI:  No-   heartburn, indigestion, abdominal pain, nausea, vomiting,  GU:  MS:  No-   joint pain or swelling.   Neuro-     nothing unusual Psych:  No- change in mood or affect. No depression or anxiety.  No  memory loss.  Objective:   Physical Exam  General- Alert, Oriented, Affect-appropriate, Distress- none acute Skin- Severe eczematoid scarring especially on elbows, antecubital areas, hands. +Mild acne Lymphadenopathy- none Head- atraumatic            Eyes- Gross vision intact, PERRLA, conjunctivae clear secretions            Ears- Hearing, canals normal            Nose- + sniffing,clear, no-Septal dev, mucus, polyps, erosion, perforation              Throat- Mallampati II , mucosa clear , drainage- none, tonsils- atrophic Neck- flexible , trachea midline, no stridor , thyroid nl, carotid no bruit Chest - symmetrical excursion , unlabored           Heart/CV- RRR , no murmur , no gallop  , no rub, nl s1 s2                           - JVD- none , edema- none, stasis changes- none, varices- none           Lung-  wheeze-none today , unlabored, cough- none , dullness-none, rub- none           Chest wall-  Abd-  Br/ Gen/ Rectal- Not done, not indicated Extrem- cyanosis- none, clubbing, none, atrophy- none, strength- nl Neuro- grossly intact to observation

## 2014-01-20 NOTE — Patient Instructions (Addendum)
Scripts sent for refills of routine meds as discussed, and for doxycycline to use for trial for skin bumps  Please call as needed

## 2014-02-04 ENCOUNTER — Telehealth: Payer: Self-pay | Admitting: Internal Medicine

## 2014-02-04 MED ORDER — PREDNISONE 10 MG PO TABS: ORAL_TABLET | ORAL | Status: DC

## 2014-02-04 NOTE — Telephone Encounter (Signed)
Called spoke with pt. She c/o increase SOB, PND, nasal cong, HA, prod cough yellow phlem x 2 days. She has used rescue inhaler 10 times already. She is requesting prednisone as this always helps her. Please advise Dr. Maple Hudson thanks [facility] Allergies  Allergen Reactions  . Latex Hives  . Peanut-Containing Drug Products     REACTION: unspecified  . Penicillins Hives  . Shrimp [Shellfish Allergy] Hives     Current Outpatient Prescriptions on File Prior to Visit  Medication Sig Dispense Refill  . acetaminophen (TYLENOL) 500 MG tablet Take 500 mg by mouth every 6 (six) hours as needed.      Marland Kitchen albuterol (PROVENTIL HFA;VENTOLIN HFA) 108 (90 BASE) MCG/ACT inhaler Inhale 2 puffs into the lungs every 6 (six) hours as needed for wheezing or shortness of breath.  1 Inhaler  prn  . albuterol (PROVENTIL) (2.5 MG/3ML) 0.083% nebulizer solution USE ONE VIAL IN NEBULIZER EVERY 6 HOURS AS NEEDED  75 mL  prn  . budesonide-formoterol (SYMBICORT) 160-4.5 MCG/ACT inhaler Inhale 2 puffs into the lungs 2 (two) times daily.  1 Inhaler  0  . clobetasol ointment (TEMOVATE) 0.05 % Apply topically 2 (two) times daily. As needed  60 g  4  . desloratadine-pseudoephedrine (CLARINEX-D 12 HOUR) 2.5-120 MG per tablet Take 1 tablet by mouth daily as needed (for allergies).       . diphenhydrAMINE (BENADRYL) 25 MG tablet Take 25 mg by mouth every 6 (six) hours as needed for allergies.       Marland Kitchen doxycycline (VIBRA-TABS) 100 MG tablet 2 today then one daily  8 tablet  0   No current facility-administered medications on file prior to visit.

## 2014-02-04 NOTE — Telephone Encounter (Signed)
Per CDY: okay for prednisone 8-day taper #20, no refills.  Call the office if symptoms do not improve or worsen.  Called spoke with patient, advised of CDY's recommendations as stated above.  Pt verbalized her understanding and will call the office if her symptoms do not improve or they worsen.  Rx sent to verified pharmacy.  Nothing further needed; will sign off.

## 2014-03-08 NOTE — Assessment & Plan Note (Signed)
Chronic eczema with scarring now much better controlled as she gets older

## 2014-03-08 NOTE — Assessment & Plan Note (Signed)
Good control since exacerbation during the winter which was probably a respiratory infection

## 2014-04-01 ENCOUNTER — Other Ambulatory Visit: Payer: Self-pay | Admitting: Internal Medicine

## 2014-11-03 ENCOUNTER — Other Ambulatory Visit: Payer: Self-pay | Admitting: Internal Medicine

## 2015-01-11 ENCOUNTER — Other Ambulatory Visit: Payer: Self-pay | Admitting: Internal Medicine

## 2015-01-21 ENCOUNTER — Ambulatory Visit: Payer: BC Managed Care – PPO | Admitting: Internal Medicine

## 2015-03-04 ENCOUNTER — Telehealth: Payer: Self-pay | Admitting: Internal Medicine

## 2015-03-04 NOTE — Telephone Encounter (Signed)
Ok to call refill

## 2015-03-04 NOTE — Telephone Encounter (Signed)
Pt cb, [phone] states she is out of town and it is urgent she gets this cream

## 2015-03-04 NOTE — Telephone Encounter (Signed)
Attempted to call the number that was listed, it's not working. The number that was left is not the number for [facility] [place] it [phone]. When calling this number the line was busy. Will try back.  Pt needs appointment before medication can be refilled.

## 2015-03-04 NOTE — Telephone Encounter (Signed)
lmomtcb x1 for pt Needs to verify which pharmacy to send RX into

## 2015-03-04 NOTE — Telephone Encounter (Signed)
[phone] pt calling back about her ointment

## 2015-03-04 NOTE — Telephone Encounter (Signed)
Pt scheduled follow up appt for 06/17/15 w/ Dr. Maple HudsonYoung. She is wanting a refill on her temovate cream. Not on current medication list but was last refilled 11/03/14. Please advise Dr. Maple HudsonYoung thanks

## 2015-03-05 ENCOUNTER — Telehealth: Payer: Self-pay | Admitting: Internal Medicine

## 2015-03-05 MED ORDER — CLOBETASOL PROPIONATE 0.05 % EX OINT: TOPICAL_OINTMENT | CUTANEOUS | Status: DC

## 2015-03-05 NOTE — Telephone Encounter (Signed)
[facility] [id number] [place] [phone] pt cb to verify pharmacy, please cb at previous number listed.

## 2015-03-05 NOTE — Telephone Encounter (Signed)
[facility] [place]  Attempted to call back on previous number listed and message states "does not accept outside calls"

## 2015-03-05 NOTE — Telephone Encounter (Signed)
[facility] and was placed on [facility] x 15 min. WCB See previous phone note. This refill was sent in 03/05/15 [facility] [place] as patient requested

## 2015-03-08 NOTE — Telephone Encounter (Signed)
Called [name] and made aware of below. Nothing further needed

## 2015-03-08 NOTE — Telephone Encounter (Signed)
ATC pharm and they are currently closed. WCB after 9 am

## 2015-04-17 ENCOUNTER — Telehealth: Payer: Self-pay | Admitting: Internal Medicine

## 2015-04-17 ENCOUNTER — Other Ambulatory Visit: Payer: Self-pay | Admitting: Internal Medicine

## 2015-04-17 MED ORDER — BUDESONIDE-FORMOTEROL FUMARATE 160-4.5 MCG/ACT IN AERO: INHALATION_SPRAY | RESPIRATORY_TRACT | Status: DC

## 2015-04-17 NOTE — Telephone Encounter (Signed)
Symbicort refill called to [facility]

## 2015-04-19 ENCOUNTER — Telehealth: Payer: Self-pay | Admitting: Internal Medicine

## 2015-04-19 NOTE — Telephone Encounter (Signed)
Called [facility] and was advised pt symbicort needs PA. No alternatives given. Dr. Maple Hudson do you want to do PA or should pt call her insurance to see what is covered?

## 2015-04-19 NOTE — Telephone Encounter (Signed)
Called pt and it went straight to VM--lmtcb x1

## 2015-04-19 NOTE — Telephone Encounter (Signed)
Pt is at work just do it and leave her a msg she doesn't understand

## 2015-04-20 NOTE — Telephone Encounter (Signed)
We need to swtich to what her insurance covers

## 2015-04-20 NOTE — Telephone Encounter (Signed)
Lmtcb.

## 2015-04-20 NOTE — Telephone Encounter (Signed)
Talked to pts mom. Informed her to call Insurance company to get an alternitive for Symbicort. Mom or pt will call back with further information.

## 2015-05-12 ENCOUNTER — Telehealth: Payer: Self-pay | Admitting: Internal Medicine

## 2015-05-12 MED ORDER — CLOBETASOL PROPIONATE 0.05 % EX OINT: TOPICAL_OINTMENT | CUTANEOUS | Status: DC

## 2015-05-12 NOTE — Telephone Encounter (Signed)
Spoke with pt and advised that refill for Temovate Oint was sent to [facility] [address].  Advised to keep f/u appt with Dr Maple Hudson.  Nothing further needed.

## 2015-06-06 ENCOUNTER — Other Ambulatory Visit: Payer: Self-pay | Admitting: Internal Medicine

## 2015-06-17 ENCOUNTER — Ambulatory Visit: Payer: Self-pay | Admitting: Internal Medicine

## 2015-07-04 ENCOUNTER — Emergency Department (HOSPITAL_COMMUNITY)
Admission: EM | Admit: 2015-07-04 | Discharge: 2015-07-04 | Disposition: A | Discharge status: Home / Self Care | Payer: BLUE CROSS/BLUE SHIELD | Attending: Emergency Medicine | Admitting: Emergency Medicine

## 2015-07-04 ENCOUNTER — Encounter (HOSPITAL_COMMUNITY): Payer: Self-pay | Admitting: Emergency Medicine

## 2015-07-04 DIAGNOSIS — Z79899 Other long term (current) drug therapy: Secondary | ICD-10-CM | POA: Insufficient documentation

## 2015-07-04 DIAGNOSIS — Z880 Allergy status to penicillin: Secondary | ICD-10-CM | POA: Diagnosis not present

## 2015-07-04 DIAGNOSIS — Z7951 Long term (current) use of inhaled steroids: Secondary | ICD-10-CM | POA: Diagnosis not present

## 2015-07-04 DIAGNOSIS — R220 Localized swelling, mass and lump, head: Secondary | ICD-10-CM | POA: Insufficient documentation

## 2015-07-04 DIAGNOSIS — R0602 Shortness of breath: Secondary | ICD-10-CM | POA: Diagnosis present

## 2015-07-04 DIAGNOSIS — Z87442 Personal history of urinary calculi: Secondary | ICD-10-CM | POA: Insufficient documentation

## 2015-07-04 DIAGNOSIS — R0981 Nasal congestion: Secondary | ICD-10-CM | POA: Diagnosis not present

## 2015-07-04 DIAGNOSIS — Z872 Personal history of diseases of the skin and subcutaneous tissue: Secondary | ICD-10-CM | POA: Diagnosis not present

## 2015-07-04 DIAGNOSIS — J45901 Unspecified asthma with (acute) exacerbation: Secondary | ICD-10-CM | POA: Insufficient documentation

## 2015-07-04 DIAGNOSIS — Z91040 Latex allergy status: Secondary | ICD-10-CM | POA: Insufficient documentation

## 2015-07-04 MED ORDER — PREDNISONE 20 MG PO TABS
60.0000 mg | ORAL_TABLET | Freq: Once | ORAL | Status: AC
  Administered 2015-07-04: 60 mg via ORAL
  Filled 2015-07-04: qty 3

## 2015-07-04 MED ORDER — IPRATROPIUM BROMIDE 0.02 % IN SOLN
0.5000 mg | Freq: Once | RESPIRATORY_TRACT | Status: AC
  Administered 2015-07-04: 0.5 mg via RESPIRATORY_TRACT
  Filled 2015-07-04: qty 2.5

## 2015-07-04 MED ORDER — ALBUTEROL SULFATE (2.5 MG/3ML) 0.083% IN NEBU
5.0000 mg | INHALATION_SOLUTION | Freq: Once | RESPIRATORY_TRACT | Status: AC
  Administered 2015-07-04: 5 mg via RESPIRATORY_TRACT
  Filled 2015-07-04: qty 6

## 2015-07-04 MED ORDER — PREDNISONE 20 MG PO TABS: 40.0000 mg | ORAL_TABLET | Freq: Every day | ORAL | Status: DC

## 2015-07-04 NOTE — ED Provider Notes (Signed)
CSN: [id number]     Arrival date & time 07/04/15  2025 History   First MD Initiated Contact with Patient 07/04/15 2035     Chief Complaint  Patient presents with  . Shortness of Breath  . Asthma  . Oral Swelling  . Cough     (Consider location/radiation/quality/duration/timing/severity/associated sxs/prior Treatment) Patient is a 24 y.o. female presenting with wheezing. The history is provided by the patient. No language interpreter was used.  Wheezing Severity:  Moderate Severity compared to prior episodes:  Similar Onset quality:  Gradual Duration:  2 weeks Timing:  Constant Progression:  Worsening Chronicity:  Recurrent Context: dust   Context: not animal exposure, not emotional upset, not exercise, not exposure to allergen, not fumes, not medical treatments, not pet dander, not pollens, not smoke exposure, not strong odors and not tartrazine   Relieved by:  Nothing Worsened by:  Allergens Ineffective treatments:  Beta-agonist inhaler Associated symptoms: cough   Associated symptoms: no chest pain, no ear pain, no fatigue, no fever, no foot swelling, no shortness of breath, no sore throat and no sputum production   Associated symptoms comment:  Upper lip swelling Risk factors: not exposed to toxic fumes, no prior hospitalizations, no prior ICU admissions, no prior intubations, no smoke inhalation and no suspected foreign body     Past Medical History  Diagnosis Date  . Allergic rhinitis   . Asthma     hosp 06/2009  . Eczema   . Kidney stone     right ER 09/17/09, 707/2012   Past Surgical History  Procedure Laterality Date  . Dental extractions     Family History  Problem Relation Age of Onset  . Asthma Mother   . Eczema Mother   . Asthma      uncle  . Eczema      uncle   Social History  Substance Use Topics  . Smoking status: Former Smoker    Types: Cigarettes  . Smokeless tobacco: Never Used  . Alcohol Use: Yes     Comment: wine, liquor,beer on weekends    OB History    No data available     Review of Systems  Constitutional: Negative for fever and fatigue.  HENT: Positive for congestion and facial swelling. Negative for ear pain and sore throat.   Respiratory: Positive for cough and wheezing. Negative for sputum production and shortness of breath.   Cardiovascular: Negative for chest pain.  All other systems reviewed and are negative.     Allergies  Latex; Peanut-containing drug products; Penicillins; and Shrimp  Home Medications   Prior to Admission medications   Medication Sig Start Date End Date Taking? Authorizing Provider  acetaminophen (TYLENOL) 500 MG tablet Take 500 mg by mouth every 6 (six) hours as needed.    Historical Provider, MD  albuterol (PROVENTIL HFA;VENTOLIN HFA) 108 (90 BASE) MCG/ACT inhaler Inhale 2 puffs into the lungs every 6 (six) hours as needed for wheezing or shortness of breath. 01/20/14   Waymon Budge, MD  albuterol (PROVENTIL) (2.5 MG/3ML) 0.083% nebulizer solution USE ONE VIAL IN NEBULIZER EVERY 6 HOURS AS NEEDED 01/20/14   Waymon Budge, MD  clobetasol ointment (TEMOVATE) 0.05 % APPLY  OINTMENT TOPICALLY TO AFFECTED AREA TWICE DAILY AS NEEDED 05/12/15   Waymon Budge, MD  desloratadine-pseudoephedrine (CLARINEX-D 12 HOUR) 2.5-120 MG per tablet Take 1 tablet by mouth daily as needed (for allergies).     Historical Provider, MD  diphenhydrAMINE (BENADRYL) 25 MG tablet Take 25  mg by mouth every 6 (six) hours as needed for allergies.     Historical Provider, MD  doxycycline (VIBRA-TABS) 100 MG tablet 2 today then one daily 01/20/14   Waymon Budgelinton D Young, MD  predniSONE (DELTASONE) 10 MG tablet Take 4 tabs by mouth x 2 days, 3 tabs x 2 days, 2 tabs x 2 days, 1 tab x 2 days and stop. 02/04/14   Waymon Budgelinton D Young, MD  SYMBICORT 160-4.5 MCG/ACT inhaler INHALE TWO PUFFS BY MOUTH TWICE DAILY 06/07/15   Waymon Budgelinton D Young, MD   BP 122/59 mmHg  Pulse 86  Temp(Src) 97.8 F (36.6 C) (Oral)  Resp 16  Ht 5\' 8"  (1.727  m)  Wt 215 lb 1 oz (97.552 kg)  BMI 32.71 kg/m2  SpO2 99%  LMP 06/13/2015 Physical Exam  Constitutional: She is oriented to person, place, and time. She appears well-developed and well-nourished. No distress.  HENT:  Head: Normocephalic and atraumatic.  Mouth/Throat: Oropharynx is clear and moist. No oropharyngeal exudate.  No apparent facial swelling.  Eyes: Conjunctivae and EOM are normal.  Neck: Normal range of motion.  Cardiovascular: Normal rate and regular rhythm.  Exam reveals no gallop and no friction rub.   No murmur heard. Pulmonary/Chest: Effort normal. She has wheezes. She has no rales. She exhibits no tenderness.  Inspiratory and expiratory wheezing noted. Left>right.   Abdominal: Soft. She exhibits no distension. There is no tenderness. There is no rebound.  Musculoskeletal: Normal range of motion.  Neurological: She is alert and oriented to person, place, and time. Coordination normal.  Speech is goal-oriented. Moves limbs without ataxia.   Skin: Skin is warm and dry.  Psychiatric: She has a normal mood and affect. Her behavior is normal.  Nursing note and vitals reviewed.   ED Course  Procedures (including critical care time) Labs Review Labs Reviewed - No data to display  Imaging Review No results found. I have personally reviewed and evaluated these images and lab results as part of my medical decision-making.   EKG Interpretation None      MDM   Final diagnoses:  Asthma exacerbation    9:06 PM Patient will have albuterol and atrovent nebulizer for wheezing. Vitals stable and patient afebrile.   Patient feeling better and will be discharged with prednisone taper.   [name] BeckKaitlyn Ammaar [place], PA-C 07/05/15 [id number]  Raeford RazorStephen Kohut, MD 07/08/15 2203

## 2015-07-04 NOTE — ED Notes (Signed)
C/o asthma flare-up, productive cough with yellow phlegm, and nasal drainage x 1 week. Reports swelling to upper lip since this morning.  Using inhalers without relief.  Also took Benadryl without relief.

## 2015-07-04 NOTE — Discharge Instructions (Signed)
Take prednisone as directed. Follow up with your doctor as needed. Return to the ED with worsening or concerning symptoms.

## 2015-07-20 ENCOUNTER — Telehealth: Payer: Self-pay | Admitting: Internal Medicine

## 2015-07-20 ENCOUNTER — Other Ambulatory Visit: Payer: Self-pay | Admitting: Internal Medicine

## 2015-07-20 MED ORDER — CLOBETASOL PROPIONATE 0.05 % EX OINT: TOPICAL_OINTMENT | CUTANEOUS | Status: DC

## 2015-07-20 NOTE — Telephone Encounter (Signed)
Spoke with pt. She needs a refill on her ointment. Has upcoming appointment with CY on 07/21/15. States she would like to have this sent in today so that her Dad can pay for it. This has been sent in. Nothing further was needed.

## 2015-07-20 NOTE — Telephone Encounter (Signed)
Pt requesting refill on temovate cream. Last refilled 9/16. Please advise thanks

## 2015-07-21 ENCOUNTER — Ambulatory Visit: Payer: BLUE CROSS/BLUE SHIELD | Admitting: Internal Medicine

## 2015-08-03 ENCOUNTER — Emergency Department (HOSPITAL_COMMUNITY)
Admission: EM | Admit: 2015-08-03 | Discharge: 2015-08-03 | Disposition: A | Discharge status: Home / Self Care | Payer: BLUE CROSS/BLUE SHIELD | Attending: Emergency Medicine | Admitting: Emergency Medicine

## 2015-08-03 ENCOUNTER — Emergency Department (HOSPITAL_COMMUNITY): Payer: BLUE CROSS/BLUE SHIELD

## 2015-08-03 ENCOUNTER — Encounter (HOSPITAL_COMMUNITY): Payer: Self-pay | Admitting: Emergency Medicine

## 2015-08-03 DIAGNOSIS — R109 Unspecified abdominal pain: Secondary | ICD-10-CM | POA: Diagnosis present

## 2015-08-03 DIAGNOSIS — Z872 Personal history of diseases of the skin and subcutaneous tissue: Secondary | ICD-10-CM | POA: Insufficient documentation

## 2015-08-03 DIAGNOSIS — N200 Calculus of kidney: Secondary | ICD-10-CM | POA: Insufficient documentation

## 2015-08-03 DIAGNOSIS — Z87891 Personal history of nicotine dependence: Secondary | ICD-10-CM | POA: Diagnosis not present

## 2015-08-03 DIAGNOSIS — Z91040 Latex allergy status: Secondary | ICD-10-CM | POA: Insufficient documentation

## 2015-08-03 DIAGNOSIS — Z3202 Encounter for pregnancy test, result negative: Secondary | ICD-10-CM | POA: Insufficient documentation

## 2015-08-03 DIAGNOSIS — Z880 Allergy status to penicillin: Secondary | ICD-10-CM | POA: Insufficient documentation

## 2015-08-03 DIAGNOSIS — J45909 Unspecified asthma, uncomplicated: Secondary | ICD-10-CM | POA: Diagnosis not present

## 2015-08-03 LAB — URINALYSIS, ROUTINE W REFLEX MICROSCOPIC
Bilirubin Urine: NEGATIVE
Glucose, UA: NEGATIVE mg/dL
Ketones, ur: 15 mg/dL — AB
Leukocytes, UA: NEGATIVE
Nitrite: NEGATIVE
PROTEIN: NEGATIVE mg/dL
Specific Gravity, Urine: 1.02 (ref 1.005–1.030)
pH: 7 (ref 5.0–8.0)

## 2015-08-03 LAB — CBC
HCT: 37.1 % (ref 36.0–46.0)
Hemoglobin: 12.1 g/dL (ref 12.0–15.0)
MCH: 27.8 pg (ref 26.0–34.0)
MCHC: 32.6 g/dL (ref 30.0–36.0)
MCV: 85.1 fL (ref 78.0–100.0)
PLATELETS: 392 10*3/uL (ref 150–400)
RBC: 4.36 MIL/uL (ref 3.87–5.11)
RDW: 12.3 % (ref 11.5–15.5)
WBC: 5.5 10*3/uL (ref 4.0–10.5)

## 2015-08-03 LAB — COMPREHENSIVE METABOLIC PANEL
ALT: 15 U/L (ref 14–54)
AST: 20 U/L (ref 15–41)
Albumin: 3.5 g/dL (ref 3.5–5.0)
Alkaline Phosphatase: 52 U/L (ref 38–126)
Anion gap: 6 (ref 5–15)
BUN: 6 mg/dL (ref 6–20)
CHLORIDE: 107 mmol/L (ref 101–111)
CO2: 24 mmol/L (ref 22–32)
CREATININE: 0.94 mg/dL (ref 0.44–1.00)
Calcium: 8.8 mg/dL — ABNORMAL LOW (ref 8.9–10.3)
GFR calc Af Amer: >60 mL/min (ref >60)
Glucose, Bld: 98 mg/dL (ref 65–99)
Potassium: 3.7 mmol/L (ref 3.5–5.1)
SODIUM: 137 mmol/L (ref 135–145)
Total Bilirubin: 0.4 mg/dL (ref 0.3–1.2)
Total Protein: 7.1 g/dL (ref 6.5–8.1)

## 2015-08-03 LAB — URINE MICROSCOPIC-ADD ON

## 2015-08-03 LAB — I-STAT BETA HCG BLOOD, ED (MC, WL, AP ONLY): I-stat hCG, quantitative: <5 m[IU]/mL (ref <5)

## 2015-08-03 MED ORDER — ONDANSETRON 4 MG PO TBDP
8.0000 mg | ORAL_TABLET | Freq: Once | ORAL | Status: AC
Start: 2015-08-03 — End: 2015-08-03
  Administered 2015-08-03: 8 mg via ORAL

## 2015-08-03 MED ORDER — KETOROLAC TROMETHAMINE 30 MG/ML IJ SOLN
30.0000 mg | Freq: Once | INTRAMUSCULAR | Status: AC
  Administered 2015-08-03: 30 mg via INTRAVENOUS
  Filled 2015-08-03: qty 1

## 2015-08-03 MED ORDER — ONDANSETRON HCL 4 MG/2ML IJ SOLN
4.0000 mg | Freq: Once | INTRAMUSCULAR | Status: AC
  Administered 2015-08-03: 4 mg via INTRAVENOUS
  Filled 2015-08-03: qty 2

## 2015-08-03 MED ORDER — OXYCODONE-ACETAMINOPHEN 5-325 MG PO TABS: 2.0000 | ORAL_TABLET | Freq: Four times a day (QID) | ORAL | Status: DC | PRN

## 2015-08-03 MED ORDER — ONDANSETRON HCL 4 MG PO TABS: 4.0000 mg | ORAL_TABLET | Freq: Four times a day (QID) | ORAL | Status: DC

## 2015-08-03 MED ORDER — ONDANSETRON 4 MG PO TBDP
ORAL_TABLET | ORAL | Status: AC
  Filled 2015-08-03: qty 2

## 2015-08-03 MED ORDER — SODIUM CHLORIDE 0.9 % IV BOLUS (SEPSIS)
1000.0000 mL | Freq: Once | INTRAVENOUS | Status: AC
  Administered 2015-08-03: 1000 mL via INTRAVENOUS

## 2015-08-03 NOTE — ED Notes (Signed)
Pt home with friend via w/c. Verbalizes understanding of instruction

## 2015-08-03 NOTE — Discharge Instructions (Signed)
Kidney Stones °Kidney stones (urolithiasis) are deposits that form inside your kidneys. The intense pain is caused by the stone moving through the urinary tract. When the stone moves, the ureter goes into spasm around the stone. The stone is usually passed in the urine.  °CAUSES  °· A disorder that makes certain neck glands produce too much parathyroid hormone (primary hyperparathyroidism). °· A buildup of uric acid crystals, similar to gout in your joints. °· Narrowing (stricture) of the ureter. °· A kidney obstruction present at birth (congenital obstruction). °· Previous surgery on the kidney or ureters. °· Numerous kidney infections. °SYMPTOMS  °· Feeling sick to your stomach (nauseous). °· Throwing up (vomiting). °· Blood in the urine (hematuria). °· Pain that usually spreads (radiates) to the groin. °· Frequency or urgency of urination. °DIAGNOSIS  °· Taking a history and physical exam. °· Blood or urine tests. °· CT scan. °· Occasionally, an examination of the inside of the urinary bladder (cystoscopy) is performed. °TREATMENT  °· Observation. °· Increasing your fluid intake. °· Extracorporeal shock wave lithotripsy--This is a noninvasive procedure that uses shock waves to break up kidney stones. °· Surgery may be needed if you have severe pain or persistent obstruction. There are various surgical procedures. Most of the procedures are performed with the use of small instruments. Only small incisions are needed to accommodate these instruments, so recovery time is minimized. °The size, location, and chemical composition are all important variables that will determine the proper choice of action for you. Talk to your health care provider to better understand your situation so that you will minimize the risk of injury to yourself and your kidney.  °HOME CARE INSTRUCTIONS  °· Drink enough water and fluids to keep your urine clear or pale yellow. This will help you to pass the stone or stone fragments. °· Strain  all urine through the provided strainer. Keep all particulate matter and stones for your health care provider to see. The stone causing the pain may be as small as a grain of salt. It is very important to use the strainer each and every time you pass your urine. The collection of your stone will allow your health care provider to analyze it and verify that a stone has actually passed. The stone analysis will often identify what you can do to reduce the incidence of recurrences. °· Only take over-the-counter or prescription medicines for pain, discomfort, or fever as directed by your health care provider. °· Keep all follow-up visits as told by your health care provider. This is important. °· Get follow-up X-rays if required. The absence of pain does not always mean that the stone has passed. It may have only stopped moving. If the urine remains completely obstructed, it can cause loss of kidney function or even complete destruction of the kidney. It is your responsibility to make sure X-rays and follow-ups are completed. Ultrasounds of the kidney can show blockages and the status of the kidney. Ultrasounds are not associated with any radiation and can be performed easily in a matter of minutes. °· Make changes to your daily diet as told by your health care provider. You may be told to: °¨ Limit the amount of salt that you eat. °¨ Eat 5 or more servings of fruits and vegetables each day. °¨ Limit the amount of meat, poultry, fish, and eggs that you eat. °· Collect a 24-hour urine sample as told by your health care provider. You may need to collect another urine sample every 6-12   months. °SEEK MEDICAL CARE IF: °· You experience pain that is progressive and unresponsive to any pain medicine you have been prescribed. °SEEK IMMEDIATE MEDICAL CARE IF:  °· Pain cannot be controlled with the prescribed medicine. °· You have a fever or shaking chills. °· The severity or intensity of pain increases over 18 hours and is not  relieved by pain medicine. °· You develop a new onset of abdominal pain. °· You feel faint or pass out. °· You are unable to urinate. °  °This information is not intended to replace advice given to you by your health care provider. Make sure you discuss any questions you have with your health care provider. °  °Document Released: 08/14/2005 Document Revised: 05/05/2015 Document Reviewed: 01/15/2013 °Elsevier Interactive Patient Education ©2016 [employer] ° °

## 2015-08-03 NOTE — ED Notes (Signed)
Pt. reports right flank flank pain with dysuria and emesis onset this evening , denies hematuria , no fever or chills.

## 2015-08-03 NOTE — ED Notes (Signed)
Patient transported to CT 

## 2015-08-03 NOTE — ED Provider Notes (Signed)
CSN: [id number]     Arrival date & time 08/03/15  0404 History   First MD Initiated Contact with Patient 08/03/15 [phone]     Chief Complaint  Patient presents with  . Flank Pain     (Consider location/radiation/quality/duration/timing/severity/associated sxs/prior Treatment) HPI Comments: Patient with past medical history remarkable for kidney stone presents to the emergency department with chief complaint of right flank pain. She states that the pain started this morning around 4 AM. She reports associated nausea and vomiting. She denies any diarrhea. She reports mild dysuria. She states that she is just finishing her menstrual cycle. She denies any radiating symptoms. She has not taken anything for her pain. There are no aggravating or alleviating factors.  The history is provided by the patient. No language interpreter was used.    Past Medical History  Diagnosis Date  . Allergic rhinitis   . Asthma     hosp 06/2009  . Eczema   . Kidney stone     right ER 09/17/09, 707/2012   Past Surgical History  Procedure Laterality Date  . Dental extractions     Family History  Problem Relation Age of Onset  . Asthma Mother   . Eczema Mother   . Asthma      uncle  . Eczema      uncle   Social History  Substance Use Topics  . Smoking status: Former Smoker    Types: Cigarettes  . Smokeless tobacco: Never Used  . Alcohol Use: Yes     Comment: wine, liquor,beer on weekends   OB History    No data available     Review of Systems  Constitutional: Negative for fever and chills.  Respiratory: Negative for shortness of breath.   Cardiovascular: Negative for chest pain.  Gastrointestinal: Positive for nausea and vomiting. Negative for diarrhea and constipation.  Genitourinary: Positive for flank pain. Negative for dysuria.  All other systems reviewed and are negative.     Allergies  Peanut-containing drug products; Shrimp; Latex; and Penicillins  Home Medications   Prior to  Admission medications   Medication Sig Start Date End Date Taking? Authorizing Provider  albuterol (PROVENTIL HFA;VENTOLIN HFA) 108 (90 BASE) MCG/ACT inhaler Inhale 2 puffs into the lungs every 6 (six) hours as needed for wheezing or shortness of breath. Patient not taking: Reported on 08/03/2015 01/20/14   Waymon Budge, MD  albuterol (PROVENTIL) (2.5 MG/3ML) 0.083% nebulizer solution USE ONE VIAL IN NEBULIZER EVERY 6 HOURS AS NEEDED Patient not taking: Reported on 08/03/2015 01/20/14   Waymon Budge, MD  clobetasol ointment (TEMOVATE) 0.05 % APPLY  OINTMENT TOPICALLY TO AFFECTED AREA TWICE DAILY AS NEEDED Patient not taking: Reported on 08/03/2015 07/20/15   Waymon Budge, MD  predniSONE (DELTASONE) 20 MG tablet Take 2 tablets (40 mg total) by mouth daily. Take 40 mg by mouth daily for 3 days, then  by mouth daily for 3 days, then  daily for 3 days Patient not taking: Reported on 08/03/2015 07/04/15   Emilia Beck, PA-C  SYMBICORT 160-4.5 MCG/ACT inhaler INHALE TWO PUFFS BY MOUTH TWICE DAILY Patient not taking: Reported on 08/03/2015 06/07/15   Waymon Budge, MD   BP 144/85 mmHg  Pulse 60  Temp(Src) 98.5 F (36.9 C) (Oral)  Resp 16  Ht  (1.753 m)  Wt 97.07 kg  BMI 31.59 kg/m2  SpO2 100%  LMP 07/30/2015 (Approximate) Physical Exam  Constitutional: She is oriented to person, place, and time. She appears well-developed  and well-nourished.  HENT:  Head: Normocephalic and atraumatic.  Eyes: Conjunctivae and EOM are normal. Pupils are equal, round, and reactive to light.  Neck: Normal range of motion. Neck supple.  Cardiovascular: Normal rate and regular rhythm.  Exam reveals no gallop and no friction rub.   No murmur heard. Pulmonary/Chest: Effort normal and breath sounds normal. No respiratory distress. She has no wheezes. She has no rales. She exhibits no tenderness.  Abdominal: Soft. Bowel sounds are normal. She exhibits no distension and no mass. There is no  tenderness. There is no rebound and no guarding.  Right-sided CVA tenderness, no other focal abdominal tenderness  Musculoskeletal: Normal range of motion. She exhibits no edema or tenderness.  Neurological: She is alert and oriented to person, place, and time.  Skin: Skin is warm and dry.  Psychiatric: She has a normal mood and affect. Her behavior is normal. Judgment and thought content normal.  Nursing note and vitals reviewed.   ED Course  Procedures (including critical care time) Results for orders placed or performed during the hospital encounter of 08/03/15  Urinalysis, Routine w reflex microscopic (not at [facility])  Result Value Ref Range   Color, Urine YELLOW YELLOW   APPearance CLEAR CLEAR   Specific Gravity, Urine 1.020 1.005 - 1.030   pH 7.0 5.0 - 8.0   Glucose, UA NEGATIVE NEGATIVE mg/dL   Hgb urine dipstick MODERATE (A) NEGATIVE   Bilirubin Urine NEGATIVE NEGATIVE   Ketones, ur 15 (A) NEGATIVE mg/dL   Protein, ur NEGATIVE NEGATIVE mg/dL   Nitrite NEGATIVE NEGATIVE   Leukocytes, UA NEGATIVE NEGATIVE  Comprehensive metabolic panel  Result Value Ref Range   Sodium 137 135 - 145 mmol/L   Potassium 3.7 3.5 - 5.1 mmol/L   Chloride 107 101 - 111 mmol/L   CO2 24 22 - 32 mmol/L   Glucose, Bld 98 65 - 99 mg/dL   BUN 6 6 - 20 mg/dL   Creatinine, Ser 6.640.94 0.44 - 1.00 mg/dL   Calcium 8.8 (L) 8.9 - 10.3 mg/dL   Total Protein 7.1 6.5 - 8.1 g/dL   Albumin 3.5 3.5 - 5.0 g/dL   AST 20 15 - 41 U/L   ALT 15 14 - 54 U/L   Alkaline Phosphatase 52 38 - 126 U/L   Total Bilirubin 0.4 0.3 - 1.2 mg/dL   GFR calc non Af Amer >60 >60 mL/min   GFR calc Af Amer >60 >60 mL/min   Anion gap 6 5 - 15  CBC  Result Value Ref Range   WBC 5.5 4.0 - 10.5 K/uL   RBC 4.36 3.87 - 5.11 MIL/uL   Hemoglobin 12.1 12.0 - 15.0 g/dL   HCT 40.337.1 47.436.0 - 25.946.0 %   MCV 85.1 78.0 - 100.0 fL   MCH 27.8 26.0 - 34.0 pg   MCHC 32.6 30.0 - 36.0 g/dL   RDW 56.312.3 87.511.5 - 64.315.5 %   Platelets 392 150 - 400 K/uL  Urine  microscopic-add on  Result Value Ref Range   Squamous Epithelial / LPF 0-5 (A) NONE SEEN   WBC, UA 0-5 0 - 5 WBC/hpf   RBC / HPF 0-5 0 - 5 RBC/hpf   Bacteria, UA FEW (A) NONE SEEN   Urine-Other LESS THAN 10 mL OF URINE SUBMITTED   I-Stat Beta hCG blood, ED ([facility], [facility], [facility] only)  Result Value Ref Range   I-stat hCG, quantitative <5.0 <5 mIU/mL   Comment 3  Ct Abdomen Pelvis Wo Contrast  08/03/2015  CLINICAL DATA:  24 year old female with sudden onset severe right flank pain at 0430 hours. Kidney stones. Initial encounter. EXAM: CT ABDOMEN AND PELVIS WITHOUT CONTRAST TECHNIQUE: Multidetector CT imaging of the abdomen and pelvis was performed following the standard protocol without IV contrast. COMPARISON:  [facility] 08/30/2011.  CT Abdomen and Pelvis 09/16/2009. FINDINGS: Negative lung bases.  No pericardial or pleural effusion. Incidental lower thoracic and upper lumbar spina bifida occulta. No acute osseous abnormality identified. Small volume pelvic free fluid in the cul-de-sac (series 2, image 74), appears physiologic. Negative noncontrast uterus and adnexa. Negative noncontrast distal colon. Proximal sigmoid is redundant but otherwise negative. Decompressed left colon. Decompressed splenic flexure and transverse colon. Motion artifact intermittently in the right abdomen. Right colon and appendix are normal. No dilated small bowel. Decompressed stomach and duodenum. Negative noncontrast liver, gallbladder, spleen, pancreas, and adrenal glands. No abdominal free fluid. No lymphadenopathy identified. Negative non contrast left kidney and left ureter. Right side nephro megaly, moderate perinephric stranding, and indistinctness of the right renal collecting system with mild to moderate hydronephrosis. Right hydroureter and periureteral stranding which continues to the pelvic inlet. The distal course of the right ureter is less well delineated. There is an unchanged right hemipelvis phlebolith on series 2,  image 78. There is a 5-6 mm round calculus at or just proximal to the level of the right ureterovesical junction which is new. Otherwise unremarkable urinary bladder. No nephrolithiasis or other urologic calculus identified. IMPRESSION: 1. Acute obstructive uropathy on the right with a 5-6 mm calculus at or just proximal to the right UVJ. No other urologic calculus identified. 2. Otherwise negative noncontrast CT Abdomen and Pelvis. Electronically Signed   By: Odessa Fleming M.D.   On: 08/03/2015 07:37    I have personally reviewed and evaluated these images and lab results as part of my medical decision-making.   MDM   Final diagnoses:  Kidney stone    Patient with right flank pain, history of kidney stones. We'll treat pain, give nausea medicine, and check CT.  CT remarkable for 5-48mm stone just proximal to right UVJ.  Pain well controlled with toradol.  Will DC with pain meds and zofran.  Recommend urology follow-up.  No evidence of infected stone.  Patient is non-toxic appearing.  She understands and agrees with the plan.  She is stable and ready for discharge.    Roxy Horseman, PA-C 08/03/15 1610  Azalia Bilis, MD 08/03/15 [phone]

## 2015-08-03 NOTE — ED Notes (Signed)
Unable to give urine specimen at triage.  

## 2015-09-13 ENCOUNTER — Encounter: Payer: Self-pay | Admitting: Internal Medicine

## 2015-09-13 ENCOUNTER — Ambulatory Visit (INDEPENDENT_AMBULATORY_CARE_PROVIDER_SITE_OTHER): Payer: BLUE CROSS/BLUE SHIELD | Admitting: Internal Medicine

## 2015-09-13 ENCOUNTER — Telehealth: Payer: Self-pay | Admitting: Internal Medicine

## 2015-09-13 VITALS — BP 112/62 | HR 80 | Ht 68.0 in | Wt 212.6 lb

## 2015-09-13 DIAGNOSIS — J45998 Other asthma: Secondary | ICD-10-CM

## 2015-09-13 MED ORDER — CLOBETASOL PROPIONATE 0.05 % EX OINT: TOPICAL_OINTMENT | CUTANEOUS | Status: DC

## 2015-09-13 NOTE — Telephone Encounter (Signed)
Spoke with pt. States that she is having an eczema breakout. This started 2 weeks ago. Eczema is on 95% of her body. Has recently run out of her ointment. HAS NOT BEEN SEEN SINCE 12/2013. Wants to see CY today if possible.  Allergies  Allergen Reactions  . Peanut-Containing Drug Products Anaphylaxis    REACTION: unspecified  . Shrimp [Shellfish Allergy] Hives  . Latex Hives  . Penicillins Hives   Current Outpatient Prescriptions on File Prior to Visit  Medication Sig Dispense Refill  . albuterol (PROVENTIL HFA;VENTOLIN HFA) 108 (90 BASE) MCG/ACT inhaler Inhale 2 puffs into the lungs every 6 (six) hours as needed for wheezing or shortness of breath. (Patient not taking: Reported on 08/03/2015) 1 Inhaler prn  . albuterol (PROVENTIL) (2.5 MG/3ML) 0.083% nebulizer solution USE ONE VIAL IN NEBULIZER EVERY 6 HOURS AS NEEDED (Patient not taking: Reported on 08/03/2015) 75 mL prn  . clobetasol ointment (TEMOVATE) 0.05 % APPLY  OINTMENT TOPICALLY TO AFFECTED AREA TWICE DAILY AS NEEDED (Patient not taking: Reported on 08/03/2015) 60 g 0  . ondansetron (ZOFRAN) 4 MG tablet Take 1 tablet (4 mg total) by mouth every 6 (six) hours. 12 tablet 0  . oxyCODONE-acetaminophen (PERCOCET/ROXICET) 5-325 MG tablet Take 2 tablets by mouth every 6 (six) hours as needed for severe pain. 15 tablet 0  . predniSONE (DELTASONE) 20 MG tablet Take 2 tablets (40 mg total) by mouth daily. Take 40 mg by mouth daily for 3 days, then 20mg  by mouth daily for 3 days, then 10mg  daily for 3 days (Patient not taking: Reported on 08/03/2015) 12 tablet 0  . SYMBICORT 160-4.5 MCG/ACT inhaler INHALE TWO PUFFS BY MOUTH TWICE DAILY (Patient not taking: Reported on 08/03/2015) 1 Inhaler 0   No current facility-administered medications on file prior to visit.   CY - please advise. Thanks.

## 2015-09-13 NOTE — Patient Instructions (Signed)
Script sent refilling clobetasol for eczema as before  Don't run out of your asthma meds- call for refills when needed  Suggest you see you primary doctor about help for back spasms

## 2015-09-13 NOTE — Telephone Encounter (Signed)
Per CY-we can refill her Temovate and have her come in as able; spoke with patient who can come in today at 2:30pm with CY and then we will fill the cream. Nothing more needed at this time.

## 2015-09-13 NOTE — Progress Notes (Signed)
Patient ID: [name] [name], female    DOB: [birth year], 25 y.o.   MRN: [id number]  HPI 05/03/11-24 year old never smoking female followed for severe chronic eczema, asthma, allergic rhinitis..... Mother here Last here September 08, 2010- allergy skin test positive. Has been sweating more in recent hot months. With that has had impetiginous lesions on legs and arms. especially in areas of severe eczema.   Wheeziing more in last 2 weeks-sometimes twice daily. Using her rescue inhaler or nebulizer 3-4 times daily but not using Qvar regularly. Mother says history of penicillin was rash  to palms and soles 18 years ago. Nasal congestion. Uses mostly her rescue inhaler.  She feels rhinitis and asthma are controlled but we discussed recognition of exacerbation and maintenance use of her steroid inhaler.  11/17/11- 50 year old never smoking female followed for severe chronic eczema, asthma, allergic rhinitis  she reports a good winter with no significant respiratory infections. Needs medication refills but denies new problems. Using Flonase daily. Admits to social smoking occasionally-we discussed this and I asked her to quit now while she can.  She asks refill of her eczema cream- we discussed again.  08/20/12- 53 year old never smoking female followed for severe chronic eczema, asthma, allergic rhinitis FOLLOWS FOR: patient states had asthma problems/cold x2 weeks ago-- denies any other complaints at this time--would like to do allergy test She took prednisone for asthma with a cold 2 weeks ago. Last prior use of prednisone was almost a year ago. Otherwise has done well. Shrimp has been a bad trigger in the past. She ate some shrimp one or 2 months ago by mistake but had no symptoms. She wonders what her food allergy status is. Occasionally eats tomato, pizza and pineapple, all of which use to cause flare of her eczema and asthma. She still keeps an EpiPen. CXR 03/04/11 IMPRESSION:  Mild peribronchial  cuffing suggests reactive airway disease. No  consolidation.  Original Report Authenticated By: Genevive Bi, M.D.    01/20/14- 66 year old never smoking female followed for severe chronic eczema, asthma, allergic rhinitis  FOLLOWS FOR: was in hospital for 3 days this past December due to "bad" asthma flare up. Has been well since that hospital stay. Likes Symbicort and rarely needing a rescue inhaler. Some nasal stuffiness manage with Claritin. We discussed her chronic eczema which has been better controlled. Discussed MRSA. Seafood allergy panel 08/20/2012 reviewed-broadly elevated for all seafoods on the panel. She can usually eat fish.  09/13/2015-25 year old female never smoker followed for severe chronic eczema, asthma, allergic rhinitis, complicated by kidney stone pt. states she is having a breakout on whole body. asthma has improved Asthma control has been excellent with little need for rescue inhaler and no sleep disturbance. Eczema has flared, typical for her in this season. She is out of her clobetasol which has worked very well. Incidental complaint of muscle spasms in her back and right shoulder blade attributed to job standing 9 hours a day reaching and twisting. I asked she address this with her primary physician.  ROS-see HPI Constitutional:   No-   weight loss, night sweats, fevers, chills, fatigue, lassitude. HEENT:   No-  headaches, difficulty swallowing, tooth/dental problems, sore throat,       Controlled sneezing, itching, ear ache, nasal congestion, post nasal drip,  CV:  No-   chest pain, orthopnea, PND, swelling in lower extremities, anasarca,  dizziness, palpitations Resp: No-   shortness of breath with exertion or at rest.  No-   productive cough,  No non-productive cough,  No- coughing up of blood.            No-   change in color of mucus.  No- wheezing.   Skin: No- acute  rash or lesions. GI:  No-   heartburn, indigestion, abdominal pain, nausea,  vomiting,  GU:  MS:  No-   joint pain or swelling.   Neuro-     nothing unusual Psych:  No- change in mood or affect. No depression or anxiety.  No memory loss.  Objective:   Physical Exam  General- Alert, Oriented, Affect-appropriate, Distress- none acute Skin- Severe eczematoid scarring especially on elbows, antecubital areas, hands. +Mild acne Lymphadenopathy- none Head- atraumatic            Eyes- Gross vision intact, PERRLA, conjunctivae clear secretions            Ears- Hearing, canals normal            Nose- + sniffing,clear, no-Septal dev, mucus, polyps, erosion, perforation             Throat- Mallampati II , mucosa clear , drainage- none, tonsils- atrophic Neck- flexible , trachea midline, no stridor , thyroid nl, carotid no bruit Chest - symmetrical excursion , unlabored           Heart/CV- RRR , no murmur , no gallop  , no rub, nl s1 s2                           - JVD- none , edema- none, stasis changes- none, varices- none           Lung-  wheeze-none today , unlabored, cough- none , dullness-none, rub- none           Chest wall-  Abd-  Br/ Gen/ Rectal- Not done, not indicated Extrem- cyanosis- none, clubbing, none, atrophy- none, strength- nl Neuro- grossly intact to observation

## 2015-10-03 NOTE — Assessment & Plan Note (Signed)
Mild intermittent uncomplicated 

## 2015-10-03 NOTE — Assessment & Plan Note (Signed)
Severe and chronic, worse in the past. This is a seasonal exacerbation reflecting dry cold weather Plan-refill Temovate, cautious sparing use. Continue hydrating skin care

## 2015-10-11 ENCOUNTER — Other Ambulatory Visit: Payer: Self-pay | Admitting: Internal Medicine

## 2015-10-11 ENCOUNTER — Telehealth: Payer: Self-pay | Admitting: Internal Medicine

## 2015-10-11 MED ORDER — BUDESONIDE-FORMOTEROL FUMARATE 160-4.5 MCG/ACT IN AERO: 2.0000 | INHALATION_SPRAY | Freq: Two times a day (BID) | RESPIRATORY_TRACT | Status: DC

## 2015-10-11 NOTE — Telephone Encounter (Signed)
Called spoke with pt. Aware RX has been sent in. Nothing further needed 

## 2015-10-25 ENCOUNTER — Ambulatory Visit (INDEPENDENT_AMBULATORY_CARE_PROVIDER_SITE_OTHER): Payer: BLUE CROSS/BLUE SHIELD | Admitting: Physician Assistant

## 2015-10-25 VITALS — BP 102/70 | HR 112 | Temp 101.7°F | Resp 18 | Ht 68.0 in | Wt 211.8 lb

## 2015-10-25 DIAGNOSIS — R05 Cough: Secondary | ICD-10-CM | POA: Diagnosis not present

## 2015-10-25 DIAGNOSIS — R509 Fever, unspecified: Secondary | ICD-10-CM | POA: Diagnosis not present

## 2015-10-25 DIAGNOSIS — J101 Influenza due to other identified influenza virus with other respiratory manifestations: Secondary | ICD-10-CM | POA: Diagnosis not present

## 2015-10-25 DIAGNOSIS — R6889 Other general symptoms and signs: Secondary | ICD-10-CM | POA: Diagnosis not present

## 2015-10-25 DIAGNOSIS — R062 Wheezing: Secondary | ICD-10-CM | POA: Diagnosis not present

## 2015-10-25 DIAGNOSIS — R059 Cough, unspecified: Secondary | ICD-10-CM

## 2015-10-25 LAB — POCT CBC
Granulocyte percent: 78.3 %G (ref 37–80)
HCT, POC: 40.3 % (ref 37.7–47.9)
HEMOGLOBIN: 13.9 g/dL (ref 12.2–16.2)
LYMPH, POC: 0.6 (ref 0.6–3.4)
MCH, POC: 28.7 pg (ref 27–31.2)
MCHC: 34.5 g/dL (ref 31.8–35.4)
MCV: 83.2 fL (ref 80–97)
MID (cbc): 0.4 (ref 0–0.9)
MPV: 8.7 fL (ref 0–99.8)
POC Granulocyte: 3.5 (ref 2–6.9)
POC LYMPH PERCENT: 13.4 %L (ref 10–50)
POC MID %: 8.3 %M (ref 0–12)
Platelet Count, POC: 220 10*3/uL (ref 142–424)
RBC: 4.84 M/uL (ref 4.04–5.48)
RDW, POC: 14.2 %
WBC: 4.5 10*3/uL — AB (ref 4.6–10.2)

## 2015-10-25 LAB — POCT INFLUENZA A/B
Influenza A, POC: POSITIVE — AB
Influenza B, POC: NEGATIVE

## 2015-10-25 MED ORDER — ALBUTEROL SULFATE (2.5 MG/3ML) 0.083% IN NEBU
2.5000 mg | INHALATION_SOLUTION | Freq: Once | RESPIRATORY_TRACT | Status: AC
  Administered 2015-10-25: 2.5 mg via RESPIRATORY_TRACT

## 2015-10-25 MED ORDER — GUAIFENESIN ER 1200 MG PO TB12: 1.0000 | ORAL_TABLET | Freq: Two times a day (BID) | ORAL | Status: DC | PRN

## 2015-10-25 MED ORDER — HYDROCOD POLST-CPM POLST ER 10-8 MG/5ML PO SUER: 5.0000 mL | Freq: Every evening | ORAL | Status: DC | PRN

## 2015-10-25 MED ORDER — IBUPROFEN 200 MG PO TABS
600.0000 mg | ORAL_TABLET | Freq: Once | ORAL | Status: AC
  Administered 2015-10-25: 600 mg via ORAL

## 2015-10-25 MED ORDER — OSELTAMIVIR PHOSPHATE 75 MG PO CAPS: 75.0000 mg | ORAL_CAPSULE | Freq: Two times a day (BID) | ORAL | Status: DC

## 2015-10-25 MED ORDER — IPRATROPIUM BROMIDE 0.02 % IN SOLN
0.5000 mg | Freq: Once | RESPIRATORY_TRACT | Status: AC
  Administered 2015-10-25: 0.5 mg via RESPIRATORY_TRACT

## 2015-10-25 NOTE — Progress Notes (Signed)
[facility] [address], [place] [address] [phone]  Date:  10/25/2015   Name:  [name] [name]   DOB:  May 28, 1991   MRN:  [id number]  PCP:  Ruthe Mannan, MD    History of Present Illness:  [name] [name] is a 25 y.o. female patient who presents to [facility] for chief complaint of fever and bodyaches that started acutely yesterday. Initial symptoms started yesterday morning.  She has been using her albuterol about 3-4 times yesterday.  She then started to develop fever, chills, and bodyaches.  She has nasal congestion.  She has productive cough of a yellow phlegm.  She took tylenol last night and nyquil which helped her to sleep, but has difficulty with coughing through the night.  She did not get the flu vaccine this year.     Patient Active Problem List   Diagnosis Date Noted  . Asthma 08/21/2013  . Influenza-like illness 08/21/2013  . Food allergy 09/01/2012  . 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 Routine general medical examination at a health care facility 04/03/2011  . 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 ALLERGIC RHINITIS 07/29/2007  . 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 Atopic eczema 07/29/2007    Past Medical History  Diagnosis Date  . Allergic rhinitis   . Asthma     hosp 06/2009  . Eczema   . Kidney stone     right ER 09/17/09, 707/2012  . Allergy     Past Surgical History  Procedure Laterality Date  . Dental extractions      Social History  Substance Use Topics  . Smoking status: Current Some Day Smoker    Types: Cigarettes  . Smokeless tobacco: Never Used  . Alcohol Use: Yes     Comment: wine, liquor,beer on weekends    Family History  Problem Relation Age of Onset  . Asthma Mother   . Eczema Mother   . Asthma      uncle  . Eczema      uncle    Allergies  Allergen Reactions  . Peanut-Containing Drug Products Anaphylaxis    REACTION: unspecified  . Shrimp [Shellfish Allergy] Hives  . Latex Hives  . Penicillins Hives     Medication list has been reviewed and updated.  Current Outpatient Prescriptions on File Prior to Visit  Medication Sig Dispense Refill  . albuterol (PROVENTIL HFA;VENTOLIN HFA) 108 (90 BASE) MCG/ACT inhaler Inhale 2 puffs into the lungs every 6 (six) hours as needed for wheezing or shortness of breath. 1 Inhaler prn  . albuterol (PROVENTIL) (2.5 MG/3ML) 0.083% nebulizer solution USE ONE VIAL IN NEBULIZER EVERY 6 HOURS AS NEEDED 75 mL prn  . budesonide-formoterol (SYMBICORT) 160-4.5 MCG/ACT inhaler Inhale 2 puffs into the lungs 2 (two) times daily. 1 Inhaler 6  . clobetasol ointment (TEMOVATE) 0.05 % APPLY  OINTMENT TOPICALLY TO AFFECTED AREA TWICE DAILY AS NEEDED 60 g 3   No current facility-administered medications on file prior to visit.    ROS ROS otherwise unremarkable unless listed above.   Physical Examination: BP 102/70 mmHg  Pulse 112  Temp(Src) 101.7 F (38.7 C) (Oral)  Resp 18  Ht 5\' 8"  (1.727 m)  Wt 211 lb 12.8 oz (96.072 kg)  BMI 32.21 kg/m2  SpO2 97%  LMP 10/11/2015 Ideal Body Weight: Weight in (lb) to have BMI = 25: 164.1  Physical Exam  Constitutional: She is oriented to person, place, and time. She appears well-developed and well-nourished. No distress.  HENT:  Head:  Normocephalic and atraumatic.  Right Ear: Tympanic membrane, external ear and ear canal normal.  Left Ear: Tympanic membrane, external ear and ear canal normal.  Nose: Mucosal edema and rhinorrhea present. Right sinus exhibits no maxillary sinus tenderness and no frontal sinus tenderness. Left sinus exhibits no maxillary sinus tenderness and no frontal sinus tenderness.  Mouth/Throat: No uvula swelling. No oropharyngeal exudate, posterior oropharyngeal edema or posterior oropharyngeal erythema.  Eyes: Conjunctivae and EOM are normal. Pupils are equal, round, and reactive to light.  Cardiovascular: Normal rate and regular rhythm.  Exam reveals no gallop, no distant heart sounds and no friction  rub.   No murmur heard. Pulmonary/Chest: No apnea. No respiratory distress. She has decreased breath sounds. She has wheezes. She has no rhonchi.  Lymphadenopathy:       Head (right side): No submandibular, no tonsillar, no preauricular and no posterior auricular adenopathy present.       Head (left side): No submandibular, no tonsillar, no preauricular and no posterior auricular adenopathy present.  Neurological: She is alert and oriented to person, place, and time.  Skin: Skin is warm and dry. She is not diaphoretic.  Psychiatric: She has a normal mood and affect. Her behavior is normal.       Results for orders placed or performed in visit on 10/25/15  POCT CBC  Result Value Ref Range   WBC 4.5 (A) 4.6 - 10.2 K/uL   Lymph, poc 0.6 0.6 - 3.4   POC LYMPH PERCENT 13.4 10 - 50 %L   MID (cbc) 0.4 0 - 0.9   POC MID % 8.3 0 - 12 %M   POC Granulocyte 3.5 2 - 6.9   Granulocyte percent 78.3 37 - 80 %G   RBC 4.84 4.04 - 5.48 M/uL   Hemoglobin 13.9 12.2 - 16.2 g/dL   HCT, POC 14.7 82.9 - 47.9 %   MCV 83.2 80 - 97 fL   MCH, POC 28.7 27 - 31.2 pg   MCHC 34.5 31.8 - 35.4 g/dL   RDW, POC 56.2 %   Platelet Count, POC 220 142 - 424 K/uL   MPV 8.7 0 - 99.8 fL     Assessment and Plan: [name] [name] is a 25 y.o. female who is here today for cough, wheezing, and fever. This appears to be influenza.  i have advised heavy hydration, rest, and anti-pyretic use. Advised that she use her albuterol every 4 hours for the first 24 hours, then start 4 times per day for 1 week. Discussed using her symbicort at recommended dosing. Asthma exacerabation due to viral disease.  She responded very well to the nebulizer treatment.  Wheezing was very faint, if present, and I think she would do well without other therapies such as prednisone, which mother and daughter inquired.  If wheezing continues to be an issue, will consider.  Temperature rechecked at 99.5 after ibuprofen.  This was not well treated, and  mother states that she will monitor her improvement.  Influenza A - Plan: oseltamivir (TAMIFLU) 75 MG capsule, Guaifenesin (MUCINEX MAXIMUM STRENGTH) 1200 MG TB12, chlorpheniramine-HYDROcodone (TUSSIONEX PENNKINETIC ER) 10-8 MG/5ML SUER, DISCONTINUED: chlorpheniramine-HYDROcodone (TUSSIONEX PENNKINETIC ER) 10-8 MG/5ML SUER  Flu-like symptoms - Plan: POCT Influenza A/B, POCT CBC, ibuprofen (ADVIL,MOTRIN) tablet 600 mg  Cough  Wheezing - Plan: albuterol (PROVENTIL) (2.5 MG/3ML) 0.083% nebulizer solution 2.5 mg, ipratropium (ATROVENT) nebulizer solution 0.5 mg, POCT Influenza A/B, POCT CBC  Fever, unspecified fever cause - Plan: ibuprofen (ADVIL,MOTRIN) tablet 600 mg  Trena Platt,  PA-C [facility] 2/28/20177:40 AM

## 2015-10-25 NOTE — Patient Instructions (Addendum)
Please hydrate well with 64 oz of water per day.  I would like you to increase your symbicort as prescribed.  2 puffs, twice per day. Please do your albuterol every 4 hours for next 24 hours.  Then do 4 times per day for 5 days.  You can do mucinex twice per day.  I would like you to let me know if the fever does not improve in the next 72 hours.      Influenza, Adult Influenza ("the flu") is a viral infection of the respiratory tract. It occurs more often in winter months because people spend more time in close contact with one another. Influenza can make you feel very sick. Influenza easily spreads from person to person (contagious). CAUSES  Influenza is caused by a virus that infects the respiratory tract. You can catch the virus by breathing in droplets from an infected person's cough or sneeze. You can also catch the virus by touching something that was recently contaminated with the virus and then touching your mouth, nose, or eyes. RISKS AND COMPLICATIONS You may be at risk for a more severe case of influenza if you smoke cigarettes, have diabetes, have chronic heart disease (such as heart failure) or lung disease (such as asthma), or if you have a weakened immune system. Elderly people and pregnant women are also at risk for more serious infections. The most common problem of influenza is a lung infection (pneumonia). Sometimes, this problem can require emergency medical care and may be life threatening. SIGNS AND SYMPTOMS  Symptoms typically last 4 to 10 days and may include:  Fever.  Chills.  Headache, body aches, and muscle aches.  Sore throat.  Chest discomfort and cough.  Poor appetite.  Weakness or feeling tired.  Dizziness.  Nausea or vomiting. DIAGNOSIS  Diagnosis of influenza is often made based on your history and a physical exam. A nose or throat swab test can be done to confirm the diagnosis. TREATMENT  In mild cases, influenza goes away on its own. Treatment is  directed at relieving symptoms. For more severe cases, your health care provider may prescribe antiviral medicines to shorten the sickness. Antibiotic medicines are not effective because the infection is caused by a virus, not by bacteria. HOME CARE INSTRUCTIONS  Take medicines only as directed by your health care provider.  Use a cool mist humidifier to make breathing easier.  Get plenty of rest until your temperature returns to normal. This usually takes 3 to 4 days.  Drink enough fluid to keep your urine clear or pale yellow.  Cover yourmouth and nosewhen coughing or sneezing,and wash your handswellto prevent thevirusfrom spreading.  Stay homefromwork orschool untilthe fever is gonefor at least 45full day. PREVENTION  An annual influenza vaccination (flu shot) is the best way to avoid getting influenza. An annual flu shot is now routinely recommended for all adults in the [place] SEEK MEDICAL CARE IF:  You experiencechest pain, yourcough worsens,or you producemore mucus.  Youhave nausea,vomiting, ordiarrhea.  Your fever returns or gets worse. SEEK IMMEDIATE MEDICAL CARE IF:  You havetrouble breathing, you become short of breath,or your skin ornails becomebluish.  You have severe painor stiffnessin the neck.  You develop a sudden headache, or pain in the face or ear.  You have nausea or vomiting that you cannot control. MAKE SURE YOU:   Understand these instructions.  Will watch your condition.  Will get help right away if you are not doing well or get worse.   This  information is not intended to replace advice given to you by your health care provider. Make sure you discuss any questions you have with your health care provider.   Document Released: 08/11/2000 Document Revised: 09/04/2014 Document Reviewed: 11/13/2011 Elsevier Interactive Patient Education [employer]

## 2015-10-26 ENCOUNTER — Other Ambulatory Visit: Payer: Self-pay | Admitting: Internal Medicine

## 2016-03-13 ENCOUNTER — Other Ambulatory Visit: Payer: Self-pay | Admitting: Internal Medicine

## 2016-05-25 ENCOUNTER — Other Ambulatory Visit: Payer: Self-pay | Admitting: Internal Medicine

## 2016-05-25 ENCOUNTER — Telehealth: Payer: Self-pay | Admitting: Internal Medicine

## 2016-05-25 NOTE — Telephone Encounter (Signed)
LMTCB x 1 

## 2016-05-26 NOTE — Telephone Encounter (Signed)
LM for pt to returncall

## 2016-05-29 NOTE — Telephone Encounter (Signed)
lmomtcb x2 for pt 

## 2016-05-30 NOTE — Telephone Encounter (Signed)
lmtcb x3 for pt. Per triage protocol, message will be closed.  

## 2016-07-17 ENCOUNTER — Other Ambulatory Visit: Payer: Self-pay | Admitting: Internal Medicine

## 2016-08-18 ENCOUNTER — Emergency Department (HOSPITAL_COMMUNITY): Payer: BLUE CROSS/BLUE SHIELD

## 2016-08-18 ENCOUNTER — Emergency Department (HOSPITAL_COMMUNITY)
Admission: EM | Admit: 2016-08-18 | Discharge: 2016-08-18 | Disposition: A | Discharge status: Home / Self Care | Payer: BLUE CROSS/BLUE SHIELD | Attending: Emergency Medicine | Admitting: Emergency Medicine

## 2016-08-18 ENCOUNTER — Encounter (HOSPITAL_COMMUNITY): Payer: Self-pay | Admitting: Emergency Medicine

## 2016-08-18 DIAGNOSIS — F17210 Nicotine dependence, cigarettes, uncomplicated: Secondary | ICD-10-CM | POA: Insufficient documentation

## 2016-08-18 DIAGNOSIS — R0602 Shortness of breath: Secondary | ICD-10-CM | POA: Diagnosis present

## 2016-08-18 DIAGNOSIS — J45901 Unspecified asthma with (acute) exacerbation: Secondary | ICD-10-CM

## 2016-08-18 MED ORDER — BUDESONIDE-FORMOTEROL FUMARATE 160-4.5 MCG/ACT IN AERO: 2.0000 | INHALATION_SPRAY | Freq: Two times a day (BID) | RESPIRATORY_TRACT | 6 refills | Status: DC

## 2016-08-18 MED ORDER — ALBUTEROL (5 MG/ML) CONTINUOUS INHALATION SOLN
15.0000 mg/h | INHALATION_SOLUTION | Freq: Once | RESPIRATORY_TRACT | Status: AC
  Administered 2016-08-18: 15 mg/h via RESPIRATORY_TRACT
  Filled 2016-08-18: qty 20

## 2016-08-18 MED ORDER — ALBUTEROL SULFATE HFA 108 (90 BASE) MCG/ACT IN AERS: 2.0000 | INHALATION_SPRAY | Freq: Four times a day (QID) | RESPIRATORY_TRACT | 6 refills | Status: DC | PRN

## 2016-08-18 MED ORDER — PREDNISONE 20 MG PO TABS: 60.0000 mg | ORAL_TABLET | Freq: Every day | ORAL | 0 refills | Status: DC

## 2016-08-18 MED ORDER — ALBUTEROL SULFATE (2.5 MG/3ML) 0.083% IN NEBU: INHALATION_SOLUTION | RESPIRATORY_TRACT | 6 refills | Status: DC

## 2016-08-18 MED ORDER — IPRATROPIUM BROMIDE 0.02 % IN SOLN
0.5000 mg | Freq: Once | RESPIRATORY_TRACT | Status: AC
  Administered 2016-08-18: 0.5 mg via RESPIRATORY_TRACT
  Filled 2016-08-18: qty 2.5

## 2016-08-18 NOTE — ED Provider Notes (Signed)
TIME SEEN: 5:35 AM  CHIEF COMPLAINT: Asthma exacerbation  HPI: Pt is a 25 y.o. female with history of asthma, eczema who presents to the emergency department with asthma exacerbation that started today. Has been without Symbicort for the past 2 days. Has had dry cough. No fevers. No chest pain. No lower extremity swelling or pain. Has been admitted to the hospital before for her asthma. Has had pneumonia before.  She received 15 mg of albuterol with EMS, 1 mg of Atrovent and 125 mg IV Solu-Medrol. Reports she is are feeling much better. EMS reports no hypoxia.  ROS: See HPI Constitutional: no fever  Eyes: no drainage  ENT: no runny nose   Cardiovascular:  no chest pain  Resp:  SOB  GI: no vomiting GU: no dysuria Integumentary: no rash  Allergy: no hives  Musculoskeletal: no leg swelling  Neurological: no slurred speech ROS otherwise negative  PAST MEDICAL HISTORY/PAST SURGICAL HISTORY:  Past Medical History:  Diagnosis Date  . Allergic rhinitis   . Allergy   . Asthma    hosp 06/2009  . Eczema   . Kidney stone    right ER 09/17/09, 707/2012    MEDICATIONS:  Prior to Admission medications   Medication Sig Start Date End Date Taking? Authorizing Provider  albuterol (PROVENTIL HFA;VENTOLIN HFA) 108 (90 BASE) MCG/ACT inhaler Inhale 2 puffs into the lungs every 6 (six) hours as needed for wheezing or shortness of breath. 01/20/14   Waymon Budgelinton D Young, MD  albuterol (PROVENTIL) (2.5 MG/3ML) 0.083% nebulizer solution USE ONE VIAL IN NEBULIZER EVERY 6 HOURS AS NEEDED 10/27/15   Waymon Budgelinton D Young, MD  budesonide-formoterol (SYMBICORT) 160-4.5 MCG/ACT inhaler Inhale 2 puffs into the lungs 2 (two) times daily. 10/11/15   Waymon Budgelinton D Young, MD  chlorpheniramine-HYDROcodone (TUSSIONEX PENNKINETIC ER) 10-8 MG/5ML SUER Take 5 mLs by mouth at bedtime as needed. 10/25/15   Collie SiadStephanie D English, PA  clobetasol ointment (TEMOVATE) 0.05 % APPLY OINTMENT TOPICALLY TO AFFECTED AREA TWICE DAILY AS NEEDED 07/19/16    Waymon Budgelinton D Young, MD  Guaifenesin [facility](MUCINEX MAXIMUM STRENGTH) 1200 MG TB12 Take 1 tablet (1,200 mg total) by mouth every 12 (twelve) hours as needed. 10/25/15   Collie SiadStephanie D English, PA  oseltamivir (TAMIFLU) 75 MG capsule Take 1 capsule (75 mg total) by mouth 2 (two) times daily. 10/25/15   Garnetta BuddyStephanie D English, PA    ALLERGIES:  Allergies  Allergen Reactions  . Peanut-Containing Drug Products Anaphylaxis    REACTION: unspecified  . Shrimp [Shellfish Allergy] Hives  . Latex Hives  . Penicillins Hives    SOCIAL HISTORY:  Social History  Substance Use Topics  . Smoking status: Current Some Day Smoker    Types: Cigarettes  . Smokeless tobacco: Never Used  . Alcohol use Yes     Comment: wine, liquor,beer on weekends    FAMILY HISTORY: Family History  Problem Relation Age of Onset  . Asthma Mother   . Eczema Mother   . Asthma      uncle  . Eczema      uncle    EXAM: BP 105/70 (BP Location: Right Arm)   Pulse 74   Temp 98.4 F (36.9 C) (Oral)   Resp 21   Ht 5\' 9"  (1.753 m)   Wt 220 lb (99.8 kg)   SpO2 100%   BMI 32.49 kg/m  CONSTITUTIONAL: Alert and oriented and responds appropriately to questions. Well-appearing; well-nourished HEAD: Normocephalic EYES: Conjunctivae clear, PERRL, EOMI ENT: normal nose; no rhinorrhea; moist mucous  membranes NECK: Supple, no meningismus, no nuchal rigidity, no LAD  CARD: RRR; S1 and S2 appreciated; no murmurs, no clicks, no rubs, no gallops RESP: Normal chest excursion without splinting or tachypnea; breath sounds Equal bilaterally with diffuse expiratory wheezing, no rhonchi or rales, no hypoxia or restaurant distress, speaking in and says, no increased work of breathing ABD/GI: Normal bowel sounds; non-distended; soft, non-tender, no rebound, no guarding, no peritoneal signs, no hepatosplenomegaly BACK:  The back appears normal and is non-tender to palpation, there is no CVA tenderness EXT: Normal ROM in all joints; non-tender to  palpation; no edema; normal capillary refill; no cyanosis, no calf tenderness or swelling    SKIN: Normal color for age and race; warm; no rash NEURO: Moves all extremities equally, sensation to light touch intact diffusely, cranial nerves II through XII intact, normal speech PSYCH: The patient's mood and manner are appropriate. Grooming and personal hygiene are appropriate.  MEDICAL DECISION MAKING: Patient here with asthma exacerbation is are improving. Will obtain EKG, portal chest x-ray. We'll give continuous albuterol with Atrovent and reassess. She has no increased work of breathing, no oxygen requirement. She states she is are feeling better. Anticipate if we can get her lungs clear we can let her go home and refill her Symbicort.  ED PROGRESS: 6:45 AM Patient's chest x-ray is clear. No infiltrate or edema. No pneumothorax.  Patient's lungs are clear after continuous albuterol. She reports feeling much better. Again no hypoxia or respiratory distress and speaking full sentences. I feel she is safe to be discharged home. We'll refill her albuterol and Symbicort. We'll discharge with steroid burst. She has a PCP. Discussed return precautions.   At this time, I do not feel there is any life-threatening condition present. I have reviewed and discussed all results (EKG, imaging, lab, urine as appropriate) and exam findings with patient/family. I have reviewed nursing notes and appropriate previous records.  I feel the patient is safe to be discharged home without further emergent workup and can continue workup as an outpatient as needed. Discussed usual and customary return precautions. Patient/family verbalize understanding and are comfortable with this plan.  Outpatient follow-up has been provided. All questions have been answered.       EKG Interpretation  Date/Time:  Friday August 18 2016 05:41:35 EST Ventricular Rate:  82 PR Interval:    QRS Duration: 86 QT Interval:  372 QTC  Calculation: 435 R Axis:   72 Text Interpretation:  Sinus rhythm RSR' in V1 or V2, probably normal variant Borderline Q waves in lateral leads No significant change since last tracing Confirmed by WARD,  DO, KRISTEN [phone]([id number]) on 08/18/2016 5:45:25 AM         Layla MawKristen N Ward, DO 08/18/16 [id number]

## 2016-08-18 NOTE — ED Triage Notes (Signed)
Pt arrived from home via ems with reports of shortness of breath. Per EMS pt has had a productive cough x 2 days and has run out of her medication for asthma and her at home nebulizer is broken. En route with EMS pt has had a total of 15mg  albuterol, 1 mg atrovent, and 125mg  Solumedrol

## 2016-09-13 ENCOUNTER — Ambulatory Visit: Payer: BLUE CROSS/BLUE SHIELD | Admitting: Internal Medicine

## 2016-09-29 ENCOUNTER — Telehealth: Payer: Self-pay | Admitting: Internal Medicine

## 2016-09-29 ENCOUNTER — Telehealth: Payer: Self-pay | Admitting: *Deleted

## 2016-09-29 MED ORDER — CLOBETASOL PROPIONATE 0.05 % EX OINT: 1.0000 "application " | TOPICAL_OINTMENT | Freq: Two times a day (BID) | CUTANEOUS | 5 refills | Status: DC

## 2016-09-29 NOTE — Telephone Encounter (Signed)
Called and spoke with pt and she stated that she is needing a refill of the clobetasol ointment.  Pt stated that she needs the ointment and not the cream.  I don't see where we have filled this since 2013.  CY please advise. Thanks   Last seen--09/13/2015 Next ov--10/03/2016

## 2016-09-29 NOTE — Telephone Encounter (Signed)
These are typically rheumatology medications unless is for sarcoid but her med hx says she has asthma. So, are you sure this question is for pulmonary ? Even so, this is not a light decisin like taking qvar v pulmicort. So it needs face to face with person taking care of patient  Dr. Kalman ShanMurali Leetta Hendriks, M.D., [facility] HospitalF.C.C.P Pulmonary and Critical Care Medicine Staff Physician [facility] Pager: [phone], If no answer or between  15:00h - 7:00h: call [phone]  09/29/2016 1:04 PM

## 2016-09-29 NOTE — Telephone Encounter (Signed)
Clobetasol 0.05% oint  30 grams   Apply topically twice daily as directed    Refill x 5

## 2016-09-29 NOTE — Telephone Encounter (Signed)
Error.  Entered in chart by error.

## 2016-09-29 NOTE — Telephone Encounter (Deleted)
Called and spoke with pt and he stated that he is now wanting to go back to the enbrel instead of the methotrexate.  MR please advise. thanks

## 2016-09-29 NOTE — Telephone Encounter (Signed)
Rx sent to preferred pharmacy. Pt aware & voiced her understanding.  Nothing further needed.  

## 2016-10-03 ENCOUNTER — Ambulatory Visit: Payer: BLUE CROSS/BLUE SHIELD | Admitting: Internal Medicine

## 2016-10-18 ENCOUNTER — Ambulatory Visit: Payer: BLUE CROSS/BLUE SHIELD | Admitting: Family Medicine

## 2016-11-04 ENCOUNTER — Emergency Department (HOSPITAL_COMMUNITY)
Admission: EM | Admit: 2016-11-04 | Discharge: 2016-11-04 | Disposition: A | Discharge status: Home / Self Care | Payer: BLUE CROSS/BLUE SHIELD | Attending: Emergency Medicine | Admitting: Emergency Medicine

## 2016-11-04 ENCOUNTER — Encounter (HOSPITAL_COMMUNITY): Payer: Self-pay | Admitting: Emergency Medicine

## 2016-11-04 DIAGNOSIS — F17210 Nicotine dependence, cigarettes, uncomplicated: Secondary | ICD-10-CM | POA: Insufficient documentation

## 2016-11-04 DIAGNOSIS — J4521 Mild intermittent asthma with (acute) exacerbation: Secondary | ICD-10-CM

## 2016-11-04 DIAGNOSIS — Z91040 Latex allergy status: Secondary | ICD-10-CM | POA: Insufficient documentation

## 2016-11-04 DIAGNOSIS — Z91010 Allergy to peanuts: Secondary | ICD-10-CM | POA: Insufficient documentation

## 2016-11-04 MED ORDER — ALBUTEROL SULFATE (2.5 MG/3ML) 0.083% IN NEBU
2.5000 mg | INHALATION_SOLUTION | Freq: Once | RESPIRATORY_TRACT | Status: AC
  Administered 2016-11-04: 2.5 mg via RESPIRATORY_TRACT
  Filled 2016-11-04: qty 3

## 2016-11-04 MED ORDER — ALBUTEROL SULFATE HFA 108 (90 BASE) MCG/ACT IN AERS
2.0000 | INHALATION_SPRAY | Freq: Four times a day (QID) | RESPIRATORY_TRACT | Status: DC
  Administered 2016-11-04: 2 via RESPIRATORY_TRACT
  Filled 2016-11-04 (×2): qty 6.7

## 2016-11-04 MED ORDER — PREDNISONE 10 MG PO TABS: 40.0000 mg | ORAL_TABLET | Freq: Every day | ORAL | 0 refills | Status: AC

## 2016-11-04 MED ORDER — FLUTICASONE PROPIONATE 50 MCG/ACT NA SUSP: 2.0000 | Freq: Every day | NASAL | 0 refills | Status: DC

## 2016-11-04 MED ORDER — MOMETASONE FURO-FORMOTEROL FUM 100-5 MCG/ACT IN AERO: 2.0000 | INHALATION_SPRAY | Freq: Two times a day (BID) | RESPIRATORY_TRACT | 0 refills | Status: DC

## 2016-11-04 MED ORDER — LORATADINE 10 MG PO TABS: 10.0000 mg | ORAL_TABLET | Freq: Every day | ORAL | 0 refills | Status: DC

## 2016-11-04 MED ORDER — PREDNISONE 20 MG PO TABS
60.0000 mg | ORAL_TABLET | Freq: Once | ORAL | Status: AC
  Administered 2016-11-04: 60 mg via ORAL
  Filled 2016-11-04: qty 3

## 2016-11-04 MED ORDER — ALBUTEROL SULFATE HFA 108 (90 BASE) MCG/ACT IN AERS: 1.0000 | INHALATION_SPRAY | Freq: Four times a day (QID) | RESPIRATORY_TRACT | 0 refills | Status: DC | PRN

## 2016-11-04 MED ORDER — MOMETASONE FURO-FORMOTEROL FUM 200-5 MCG/ACT IN AERO
2.0000 | INHALATION_SPRAY | Freq: Two times a day (BID) | RESPIRATORY_TRACT | Status: DC
  Filled 2016-11-04: qty 8.8

## 2016-11-04 NOTE — ED Notes (Signed)
RT called for breathing tx. 

## 2016-11-04 NOTE — Discharge Instructions (Signed)
You have been prescribed loratadine for nasal stuffiness and allergies, take this medication daily.  Flonase nasal spray will help to decrease nasal congestion and runny nose as well.  Use of a humidifer and vaseline intra nasally may help decrease dryness and prevent nose bleeds.  Return to the emergency department if your nose bleeds do not stop bleeding with pressure or you are having blood drain in the back of your throat.   Take prednisone as prescribed.  Use dulera twice daily to control asthma attacks and carry your albuterol inhaler and use it as a rescue inhaler only when needed.

## 2016-11-04 NOTE — ED Triage Notes (Signed)
Pt BIB EMS. Pt states she is short of breath today. She has a hx of asthma. She has been out of her Symbicort for 2 days. She does not have a rescue inhaler. She has no resp distress. She is a smoker.

## 2016-11-04 NOTE — ED Provider Notes (Signed)
[facility]-EMERGENCY DEPT Provider Note   CSN: [id number] Arrival date & time: 11/04/16  1942 By signing my name below, I, [name] [name], attest that this documentation has been prepared under the direction and in the presence of non-physician practitioner, Sharen Heck, PA-C. Electronically Signed: Levon [name], Scribe. 11/04/2016. 8:33 PM.   History   Chief Complaint Chief Complaint  Patient presents with  . Asthma    HPI [name] [name] is a 26 y.o. female with a history of asthma who presents to the Emergency Department complaining of gradual onset, gradually worsening shortness of breath onset this morning She notes associated nonproductive cough, rhinorrhea and chest tightness. She also endorses epistaxis yesterday after she blew her nose.  Nose bleed stopped with light pressure after a few minutes and has not recurred since. No treatments tried PTA for asthma. Pt states that her asthma is well controlled using Symbicort 2x daily, but she ran out of her Symbicort yesterday. She states her last asthma exacerbation was a few months ago; per chart review, pt last visited the ED on 08/18/16 for asthma exacerbation and had been out of Symbicort for 2 days at that time. She has been admitted to the hospital for her asthma in the past. She does not use a rescue inhaler. Patient denies intubation for asthma exacerbation.  Pt endorses occasional tobacco use and states she smokes about 2 cigarettes per week. Pt denies any fever, chest pain, or sore throat.   The history is provided by the patient. No language interpreter was used.    Past Medical History:  Diagnosis Date  . Allergic rhinitis   . Allergy   . Asthma    hosp 06/2009  . Eczema   . Kidney stone    right ER 09/17/09, 707/2012    Patient Active Problem List   Diagnosis Date Noted  . Asthma 08/21/2013  . Influenza-like illness 08/21/2013  . Food allergy 09/01/2012  . 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 Routine general medical  examination at a health care facility 04/03/2011  . 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 ALLERGIC RHINITIS 07/29/2007  . 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 Atopic eczema 07/29/2007    Past Surgical History:  Procedure Laterality Date  . dental extractions      OB History    No data available       Home Medications    Prior to Admission medications   Medication Sig Start Date End Date Taking? Authorizing Provider  albuterol (PROVENTIL HFA;VENTOLIN HFA) 108 (90 Base) MCG/ACT inhaler Inhale 1-2 puffs into the lungs every 6 (six) hours as needed for wheezing or shortness of breath. 11/04/16   Liberty Handy, PA-C  budesonide-formoterol (SYMBICORT) 160-4.5 MCG/ACT inhaler Inhale 2 puffs into the lungs 2 (two) times daily. 08/18/16   Kristen N Ward, DO  clobetasol ointment (TEMOVATE) 0.05 % Apply 1 application topically 2 (two) times daily. 09/29/16   Waymon Budge, MD  fluticasone (FLONASE) 50 MCG/ACT nasal spray Place 2 sprays into both nostrils daily. FOR NASAL CONGESTION 11/04/16   Liberty Handy, PA-C  loratadine (CLARITIN) 10 MG tablet Take 1 tablet (10 mg total) by mouth daily. 11/04/16   Liberty Handy, PA-C  mometasone-formoterol (DULERA) 100-5 MCG/ACT AERO Inhale 2 puffs into the lungs 2 (two) times daily. 11/04/16   Liberty Handy, PA-C  predniSONE (DELTASONE) 10 MG tablet Take 4 tablets (40 mg total) by mouth daily. 11/04/16 11/09/16  Liberty Handy, PA-C    Family History Family History  Problem Relation  Age of Onset  . Asthma Mother   . Eczema Mother   . Asthma      uncle  . Eczema      uncle    Social History Social History  Substance Use Topics  . Smoking status: Current Some Day Smoker    Types: Cigarettes  . Smokeless tobacco: Never Used  . Alcohol use Yes     Comment: wine, liquor,beer on weekends     Allergies   Peanut-containing drug products; Shrimp [shellfish allergy]; Latex; and Penicillins   Review of Systems Review of Systems    Constitutional: Negative for fever.  HENT: Negative for sore throat.   Respiratory: Positive for cough, chest tightness and shortness of breath.   Cardiovascular: Negative for chest pain.    Physical Exam Updated Vital Signs BP 118/80   Pulse 84   Temp 98.1 F (36.7 C) (Oral)   Resp 18   SpO2 100%   Physical Exam  Constitutional: She appears well-developed and well-nourished.  Non-toxic appearance. No distress.  HENT:  Head: Normocephalic and atraumatic.  Right Ear: External ear normal.  Left Ear: External ear normal.  Nose: Mucosal edema and rhinorrhea present.  Mouth/Throat: Uvula is midline, oropharynx is clear and moist and mucous membranes are normal. No oropharyngeal exudate, posterior oropharyngeal edema, posterior oropharyngeal erythema or tonsillar abscesses. Tonsils are 1+ on the right. Tonsils are 1+ on the left.  Moderate bilateral mucosal edema with clear nasal discharge.  No intranasal trauma   Eyes: Conjunctivae and EOM are normal. Pupils are equal, round, and reactive to light. No scleral icterus.  Neck: Normal range of motion. Neck supple. No JVD present.  Cardiovascular: Normal rate, regular rhythm and normal heart sounds.   No murmur heard. Pulmonary/Chest: Effort normal. She has wheezes.  Expiratory wheezing at RUL and LUL without rhonchi or crackles. No egophony.   RR within normal limits. SpO2 within normal limits.  Normal breathing effort. Patient speaking in full sentences. No pursed lip breathing. No chest wall retractions. No cyanosis. Chest wall expansion symmetric.   No chest wall tenderness. No JVD. No orthopnea with head of bed flat.   Musculoskeletal: Normal range of motion. She exhibits no deformity.  Lymphadenopathy:    She has no cervical adenopathy.  Neurological: She is alert.  Skin: Skin is warm and dry. Capillary refill takes less than 2 seconds. Rash noted.  Eczematous rash to posterior neck crease, antecubital spaces, elbows and  popliteal spaces.  Psychiatric: She has a normal mood and affect. Her behavior is normal. Judgment and thought content normal.  Nursing note and vitals reviewed.   ED Treatments / Results  DIAGNOSTIC STUDIES:  Oxygen Saturation is 99% on RA, normal by my interpretation.    COORDINATION OF CARE:  8:31 PM Discussed treatment plan which includes albuterol and Prednisone with pt at bedside and pt agreed to plan.  Labs (all labs ordered are listed, but only abnormal results are displayed) Labs Reviewed - No data to display  EKG  EKG Interpretation None       Radiology No results found.  Procedures Procedures (including critical care time)  Medications Ordered in ED Medications  albuterol (PROVENTIL HFA;VENTOLIN HFA) 108 (90 Base) MCG/ACT inhaler 2 puff (2 puffs Inhalation Given 11/04/16 2124)  mometasone-formoterol (DULERA) 200-5 MCG/ACT inhaler 2 puff (not administered)  albuterol (PROVENTIL) (2.5 MG/3ML) 0.083% nebulizer solution 2.5 mg (2.5 mg Nebulization Given 11/04/16 2120)  predniSONE (DELTASONE) tablet 60 mg (60 mg Oral Given 11/04/16 2107)  Initial Impression / Assessment and Plan / ED Course  I have reviewed the triage vital signs and the nursing notes.  Pertinent labs & imaging results that were available during my care of the patient were reviewed by me and considered in my medical decision making (see chart for details).    Final Clinical Impressions(s) / ED Diagnoses  Patient with mild signs and symptoms of asthma exacerbation.  Asthma is well controlled with symbicort at home however patient ran out of symbicort yesterday and did not get her daily morning dose today.  On exam oxygen saturation is above 90%, no tachypnea, tachycardia or fever.  Patient is non toxic appearing, speaking in full sentences without accessory muscle use, no cyanosis. Initially there was bilateral lower lobe expiratory wheezing which improved after neb tx x 1 in Ed.  No signs of  consolidation on lung exam, chest x-ray not indicated at this time. Patient reported resolution of chest tightness and wheezing after neb treatment.  Patient given prednisone 60 mg in ED. Will discharge with dulera, albuterol, prednisone, flonase and loratadine. One-time episode of short lasting epistaxis immediately after blowing nose appears to be benign in nature. Patient has no known bleeding or clotting disorders, she is not on any anticoagulants. There was moderate bilateral mucosal edema but no signs of active bleed or intranasal source of bleeding that would need intervention at this time. Pt instructed to follow up with PCP. Patient is hemodynamically stable. Discussed return precautions. Pt considered safe for discharge.     Final diagnoses:  Mild intermittent asthma with exacerbation   New Prescriptions New Prescriptions   ALBUTEROL (PROVENTIL HFA;VENTOLIN HFA) 108 (90 BASE) MCG/ACT INHALER    Inhale 1-2 puffs into the lungs every 6 (six) hours as needed for wheezing or shortness of breath.   FLUTICASONE (FLONASE) 50 MCG/ACT NASAL SPRAY    Place 2 sprays into both nostrils daily. FOR NASAL CONGESTION   LORATADINE (CLARITIN) 10 MG TABLET    Take 1 tablet (10 mg total) by mouth daily.   MOMETASONE-FORMOTEROL (DULERA) 100-5 MCG/ACT AERO    Inhale 2 puffs into the lungs 2 (two) times daily.   PREDNISONE (DELTASONE) 10 MG TABLET    Take 4 tablets (40 mg total) by mouth daily.   I personally performed the services described in this documentation, which was scribed in my presence. The recorded information has been reviewed and is accurate.    Liberty Handylaudia J Danthony Kendrix, PA-C 11/04/16 2142    Liberty Handylaudia J Aerica Rincon, PA-C 11/04/16 2153    Raeford RazorStephen Kohut, MD 11/06/16 [phone]

## 2018-10-14 ENCOUNTER — Telehealth: Payer: Self-pay | Admitting: *Deleted

## 2018-10-14 ENCOUNTER — Encounter: Payer: Self-pay | Admitting: Allergy

## 2018-10-14 ENCOUNTER — Ambulatory Visit (INDEPENDENT_AMBULATORY_CARE_PROVIDER_SITE_OTHER): Payer: 59 | Admitting: Allergy

## 2018-10-14 VITALS — BP 130/70 | HR 80 | Temp 98.3°F | Resp 20 | Ht 68.0 in | Wt 289.4 lb

## 2018-10-14 DIAGNOSIS — J3089 Other allergic rhinitis: Secondary | ICD-10-CM

## 2018-10-14 DIAGNOSIS — J4540 Moderate persistent asthma, uncomplicated: Secondary | ICD-10-CM | POA: Insufficient documentation

## 2018-10-14 DIAGNOSIS — L2089 Other atopic dermatitis: Secondary | ICD-10-CM

## 2018-10-14 DIAGNOSIS — T781XXD Other adverse food reactions, not elsewhere classified, subsequent encounter: Secondary | ICD-10-CM | POA: Diagnosis not present

## 2018-10-14 DIAGNOSIS — T781XXA Other adverse food reactions, not elsewhere classified, initial encounter: Secondary | ICD-10-CM | POA: Insufficient documentation

## 2018-10-14 MED ORDER — MONTELUKAST SODIUM 10 MG PO TABS: 10.0000 mg | ORAL_TABLET | Freq: Every day | ORAL | 5 refills | Status: DC

## 2018-10-14 MED ORDER — FLUTICASONE PROPIONATE 50 MCG/ACT NA SUSP: 2.0000 | Freq: Every day | NASAL | 0 refills | Status: DC

## 2018-10-14 MED ORDER — BUDESONIDE-FORMOTEROL FUMARATE 160-4.5 MCG/ACT IN AERO: 2.0000 | INHALATION_SPRAY | Freq: Two times a day (BID) | RESPIRATORY_TRACT | 5 refills | Status: DC

## 2018-10-14 MED ORDER — EPINEPHRINE 0.3 MG/0.3ML IJ SOAJ: 0.3000 mg | Freq: Once | INTRAMUSCULAR | 1 refills | Status: AC

## 2018-10-14 MED ORDER — CRISABOROLE 2 % EX OINT: 1.0000 "application " | TOPICAL_OINTMENT | Freq: Two times a day (BID) | CUTANEOUS | 3 refills | Status: DC | PRN

## 2018-10-14 MED ORDER — BUDESONIDE-FORMOTEROL FUMARATE 160-4.5 MCG/ACT IN AERO: 2.0000 | INHALATION_SPRAY | Freq: Two times a day (BID) | RESPIRATORY_TRACT | 6 refills | Status: DC

## 2018-10-14 MED ORDER — FLUTICASONE PROPIONATE 50 MCG/ACT NA SUSP: 2.0000 | Freq: Every day | NASAL | 5 refills | Status: DC

## 2018-10-14 MED ORDER — MOMETASONE FUROATE 0.1 % EX OINT: TOPICAL_OINTMENT | Freq: Two times a day (BID) | CUTANEOUS | 3 refills | Status: DC

## 2018-10-14 MED ORDER — HYDROCORTISONE 2.5 % EX OINT: TOPICAL_OINTMENT | Freq: Two times a day (BID) | CUTANEOUS | 3 refills | Status: DC

## 2018-10-14 NOTE — Assessment & Plan Note (Signed)
Diagnosed with asthma over 20 years ago.  Using Symbicort 160 1 puff once or twice a day as needed and albuterol as needed with good benefit.  Tried Advair and Singulair in the past.  No recent hospitalization/ER/urgent care visits or prednisone use.  Today's spirometry showed consistent with possible restrictive disease with 11% improvement in FEV1 post bronchodilator treatment.  Patient felt clinically improved as well. . Daily controller medication(s):  Start Singulair 10mg  daily. o Symbicort 160 1-2 puffs twice a day with spacer and rinse mouth afterwards on a regular basis. . Prior to physical activity: May use albuterol rescue inhaler 2 puffs 5 to 15 minutes prior to strenuous physical activities. Marland Kitchen Rescue medications: May use albuterol rescue inhaler 2 puffs or nebulizer every 4 to 6 hours as needed for shortness of breath, chest tightness, coughing, and wheezing. Monitor frequency of use.

## 2018-10-14 NOTE — Patient Instructions (Addendum)
Today's skin testing showed: Positive to grass, tree, ragweed, weed, dust mites, dog, cat, mold, horse, mouse.  Food allergy: Continue to avoid peanuts, tree nuts, sesame and shellfish. The patients history suggests shellfish allergy, though todays skin tests were negative despite a positive histamine control.  Food allergen skin testing has excellent negative predictive value however there is still a 5% chance that the allergy exists. Therefore, we will investigate further with serum specific IgE levels and, if negative then schedule for open graded oral food challenge.  A laboratory order form has been provided for serum specific IgE against shellfish.  Until the food allergy has been definitively ruled out, the patient is to continue meticulous avoidance of peanuts, tree nuts, sesame and shellfish and have access to epinephrine autoinjector 2 pack. Food action plan given.   I have prescribed epinephrine injectable and demonstrated proper use. For mild symptoms you can take over the counter antihistamines such as Benadryl and monitor symptoms closely. If symptoms worsen or if you have severe symptoms including breathing issues, throat closure, significant swelling, whole body hives, severe diarrhea and vomiting, lightheadedness then inject epinephrine and seek immediate medical care afterwards. Food action plan given.  Allergic rhinitis:  May use over the counter antihistamines such as Zyrtec (cetirizine), Claritin (loratadine), Allegra (fexofenadine), or Xyzal (levocetirizine) daily as needed.  May use Flonase 1 spray 1-2 times a day as needed.  Start singulair 10mg  daily.  Asthma: . Daily controller medication(s): Singulair 10mg  daily o Symbicort 160 1-2 puffs twice a day with spacer and rinse mouth afterwards. . Prior to physical activity: May use albuterol rescue inhaler 2 puffs 5 to 15 minutes prior to strenuous physical activities. Marland Kitchen. Rescue medications: May use albuterol rescue  inhaler 2 puffs or nebulizer every 4 to 6 hours as needed for shortness of breath, chest tightness, coughing, and wheezing. Monitor frequency of use.  . Asthma control goals:  o Full participation in all desired activities (may need albuterol before activity) o Albuterol use two times or less a week on average (not counting use with activity) o Cough interfering with sleep two times or less a month o Oral steroids no more than once a year o No hospitalizations  Severe Eczema Medications:  Will start prior authorization process for dupixent injections. First dose given in office.  . Only apply to affected areas that are "rough and red" . Face: hydrocortisone 2.5% ointment twice daily as needed to affected areas (up to 7 days in a row); stop when clear and can restart as needed for flares. . May use Eucrisa twice a day for 28 days for mild flares on face and body. Body:  . Thick, stubborn areas (legs and feet): mometasone 0.1% ointment twice daily as needed to affected areas (up to 3 weeks in a row); stop when clear and can restart as needed for flares. . For more than twice a day use the following: Aquaphor, Vaseline, Cerave, Cetaphil, Eucerin, Vanicream.  Itching: . Take Zyrtec 10mg  in the morning  Follow up in 2 months  Skin care recommendations  Bath time: . Always use lukewarm water. AVOID very hot or cold water. Marland Kitchen. Keep bathing time to 5-10 minutes. . Do NOT use bubble bath. . Use a mild soap and use just enough to wash the dirty areas. . Do NOT scrub skin vigorously.  . After bathing, pat dry your skin with a towel. Do NOT rub or scrub the skin.  Moisturizers and prescriptions:  . ALWAYS apply moisturizers immediately  after bathing (within 3 minutes). This helps to lock-in moisture. . Use the moisturizer several times a day over the whole body. Peri Jefferson summer moisturizers include: Aveeno, CeraVe, Cetaphil. Peri [place] winter moisturizers include: Aquaphor, Vaseline, Cerave,  Cetaphil, Eucerin, Vanicream. . When using moisturizers along with medications, the moisturizer should be applied about one hour after applying the medication to prevent diluting effect of the medication or moisturize around where you applied the medications. When not using medications, the moisturizer can be continued twice daily as maintenance.  Laundry and clothing: . Avoid laundry products with added color or perfumes. . Use unscented hypo-allergenic laundry products such as Tide free, Cheer free & gentle, and All free and clear.  . If the skin still seems dry or sensitive, you can try double-rinsing the clothes. . Avoid tight or scratchy clothing such as wool. . Do not use fabric softeners or dyer sheets.  Control of House Dust Mite Allergen . Dust mite allergens are a common trigger of allergy and asthma symptoms. While they can be found throughout the house, these microscopic creatures thrive in warm, humid environments such as bedding, upholstered furniture and carpeting. . Because so much time is spent in the bedroom, it is essential to reduce mite levels there.  . Encase pillows, mattresses, and box springs in special allergen-proof fabric covers or airtight, zippered plastic covers.  . Bedding should be washed weekly in hot water (130 F) and dried in a hot dryer. Allergen-proof covers are available for comforters and pillows that can't be regularly washed.  Reyes Ivan the allergy-proof covers every few months. Minimize clutter in the bedroom. Keep pets out of the bedroom.  Marland Kitchen Keep humidity less than 50% by using a dehumidifier or air conditioning. You can buy a humidity measuring device called a hygrometer to monitor this.  . If possible, replace carpets with hardwood, linoleum, or washable area rugs. If that's not possible, vacuum frequently with a vacuum that has a HEPA filter. . Remove all upholstered furniture and non-washable window drapes from the bedroom. . Remove all non-washable  stuffed toys from the bedroom.  Wash stuffed toys weekly. Reducing Pollen Exposure . Pollen seasons: trees (spring), grass (summer) and ragweed/weeds (fall). Marland Kitchen Keep windows closed in your home and car to lower pollen exposure.  Lilian Kapur air conditioning in the bedroom and throughout the house if possible.  . Avoid going out in dry windy days - especially early morning. . Pollen counts are highest between 5 - 10 AM and on dry, hot and windy days.  . Save outside activities for late afternoon or after a heavy rain, when pollen levels are lower.  . Avoid mowing of grass if you have grass pollen allergy. Marland Kitchen Be aware that pollen can also be transported indoors on people and pets.  . Dry your clothes in an automatic dryer rather than hanging them outside where they might collect pollen.  . Rinse hair and eyes before bedtime. Pet Allergen Avoidance: Contrary to popular opinion, there are no "hypoallergenic" breeds of dogs or cats. That is because people are not allergic to an animal's hair, but to an allergen found in the animal's saliva, dander (dead skin flakes) or urine. Pet allergy symptoms typically occur within minutes. For some people, symptoms can build up and become most severe 8 to 12 hours after contact with the animal. People with severe allergies can experience reactions in public places if dander has been transported on the pet owners' clothing. Keeping an animal outdoors  is only a partial solution, since homes with pets in the yard still have higher concentrations of animal allergens. Before getting a pet, ask your allergist to determine if you are allergic to animals. If your pet is already considered part of your family, try to minimize contact and keep the pet out of the bedroom and other rooms where you spend a great deal of time. As with dust mites, vacuum carpets often or replace carpet with a hardwood floor, tile or linoleum. High-efficiency particulate air (HEPA) cleaners can reduce  allergen levels over time. While dander and saliva are the source of cat and dog allergens, urine is the source of allergens from rabbits, hamsters, mice and [place] pigs; so ask a non-allergic family member to clean the animal's cage. If you have a pet allergy, talk to your allergist about the potential for allergy immunotherapy (allergy shots). This strategy can often provide long-term relief. Mold Control Mold and fungi can grow on a variety of surfaces provided certain temperature and moisture conditions exist.  Outdoor molds grow on plants, decaying vegetation and soil. The major outdoor mold, Alternaria and Cladosporium, are found in very high numbers during hot and dry conditions. Generally, a late summer - fall peak is seen for common outdoor fungal spores. Rain will temporarily lower outdoor mold spore count, but counts rise rapidly when the rainy period ends. The most important indoor molds are Aspergillus and Penicillium. Dark, humid and poorly ventilated basements are ideal sites for mold growth. The next most common sites of mold growth are the bathroom and the kitchen. Outdoor (Seasonal) Mold Control Use air conditioning and keep windows closed. Avoid exposure to decaying vegetation. Avoid leaf raking. Avoid grain handling. Consider wearing a face mask if working in moldy areas.  Indoor (Perennial) Mold Control  Maintain humidity below 50%. Get rid of mold growth on hard surfaces with water, detergent and, if necessary, 5% bleach (do not mix with other cleaners). Then dry the area completely. If mold covers an area more than 10 square feet, consider hiring an indoor environmental professional. For clothing, washing with soap and water is best. If moldy items cannot be cleaned and dried, throw them away. Remove sources e.g. contaminated carpets. Repair and seal leaking roofs or pipes. Using dehumidifiers in damp basements may be helpful, but empty the water and clean units regularly to  prevent mildew from forming. All rooms, especially basements, bathrooms and kitchens, require ventilation and cleaning to deter mold and mildew growth. Avoid carpeting on concrete or damp floors, and storing items in damp areas.

## 2018-10-14 NOTE — Assessment & Plan Note (Signed)
Perennial rhinoconjunctivitis symptoms for the past 10 years but worse the last year.  Symptoms seem to flare during the spring.  Tried Flonase and Claritin with some benefit.  Used to be on allergy injections possibly for a year with some benefit as a child.  No previous ENT evaluation.  Today's skin testing showed: Positive to grass, tree, ragweed, weed, dust mites, dog, cat, mold, horse, mouse.  Discussed environmental control measures.  May use over the counter antihistamines such as Zyrtec (cetirizine), Claritin (loratadine), Allegra (fexofenadine), or Xyzal (levocetirizine) daily as needed.  May use Flonase 1 spray 1-2 times a day as needed.  Start Singulair 10mg  daily.

## 2018-10-14 NOTE — Assessment & Plan Note (Addendum)
Reactions to peanuts and shellfish in the past but able to eat shrimp with no issues.  Today's skin testing showed: peanuts, tree nuts, sesame.  Continue to avoid peanuts, tree nuts, sesame and shellfish.  The patients history suggests shellfish allergy, though todays skin tests were negative despite a positive histamine control.  Food allergen skin testing has excellent negative predictive value however there is still a 5% chance that the allergy exists. Therefore, we will investigate further with serum specific IgE levels and, if negative then schedule for open graded oral food challenge.  A laboratory order form has been provided for serum specific IgE against shellfish.  Until the food allergy has been definitively ruled out, the patient is to continue meticulous avoidance of peanuts, tree nuts, sesame and shellfish (May eat shrimp as before).  Food action plan given.   I have prescribed epinephrine injectable and demonstrated proper use. For mild symptoms you can take over the counter antihistamines such as Benadryl and monitor symptoms closely. If symptoms worsen or if you have severe symptoms including breathing issues, throat closure, significant swelling, whole body hives, severe diarrhea and vomiting, lightheadedness then inject epinephrine and seek immediate medical care afterwards.

## 2018-10-14 NOTE — Telephone Encounter (Signed)
Got it but she may not be able to afford. She has ins plan that she has to pay out pocket then get reimbursed.  I will try to call her to come by and sign dupixent my way dermatitis form just in case.    ===View-only below this line=== ----- Message ----- From: Mariane Duval, CMA Sent: 10/14/2018  12:10 PM EST To: Devoria Glassing, CMA Subject: dupixent                                       Hello. Dupixent sample given today for horrible eczema. Yes that is the correct diagnosis.

## 2018-10-14 NOTE — Assessment & Plan Note (Addendum)
Eczema since a young child.  Currently using clobetasol almost daily on the face with good benefit.  Has tried Elidel and hydrocortisone in the past with no improvement in symptoms.  Patient has patches of hyperpigmented with leathery/hyperkeratotic skin changes on popliteal fossa b/l and hands.   Given patient's long term issues with eczema we discussed at length the risks and benefits of Dupixent. Patient was agreeable to starting and a sample of Dupixent was given in the office. Patient tolerated it well with no issues. Patient was shown how to administer and self-administered 1 of the 2 injections.   Also discussed that clobetasol should NOT be used above neck as it can cause long term damage to the skin.   Discussed proper skin care measures.  Medications: . Only apply to affected areas that are "rough and red" . Face: hydrocortisone 2.5% ointment twice daily as needed to affected areas (up to 7 days in a row); stop when clear and can restart as needed for flares. . May use Eucrisa twice a day for 28 days for mild flares on face and body. Body:  . Thick, stubborn areas (legs and feet): mometasone 0.1% ointment twice daily as needed to affected areas (up to 3 weeks in a row); stop when clear and can restart as needed for flares. . For more than twice a day use the following: Aquaphor, Vaseline, Cerave, Cetaphil, Eucerin, Vanicream.  Itching: . Take Zyrtec 10mg  in the morning

## 2018-10-14 NOTE — Progress Notes (Signed)
New Patient Note  RE: [name] [name] MRN: [id number] DOB: May 12, 1991 Date of Office Visit: 10/14/2018  Referring provider: Dianne Dun, MD Primary care provider: Dianne Dun, MD  Chief Complaint: Asthma; Allergies; Eczema; FOOD ALLERGY; and New Patient (Initial Visit)  History of Present Illness: I had the pleasure of seeing [name] [name] for initial evaluation at the [facility] on 10/14/2018. She is a 28 y.o. female, who is referred here by Dianne Dun, MD for the evaluation of asthma and allergies. She is accompanied today by her friend who provided/contributed to the history.   Asthma: She reports symptoms of chest tightness, shortness of breath, coughing, wheezing, nocturnal awakenings for 20+ years. Current medications include Symbicort 160 1 puff BID x 10 yrs and albuterol prn which help. She reports not using aerochamber with asthma inhalers. She tried the following inhalers: Advair, Singulair. Main asthma triggers are exercise, dust, weather changes. In the last month, frequency of asthma symptoms: <1x/week. Frequency of nocturnal symptoms: 0x/month. Frequency of SABA use: <1x/week. Interference with physical activity: yes. Sleep is undisturbed. In the last 12 months, emergency room visits/urgent care visits/doctor office visits or hospitalizations due to asthma: 0. In the last 12 months, oral steroids courses: 0. Lifetime history of hospitalization for asthma: yes. Prior intubations: no. History of pneumonia: no. She was evaluated by allergist/pulmonologist in the past. Smoking exposure: social smoker. Up to date with flu vaccine: no.  Patient used to follow with pulmonology and last OV was in 2018.  Allergic rhinitis: She reports symptoms of nasal congestion, rhinorrhea, watery eyes, itchy ears. Symptoms have been going on for 10 years but worse the last year. The symptoms are present all year around with worsening in spring. Anosmia: no. Headache: yes.  She has used Flonase, Claritin with minimal improvement in symptoms. Sinus infections: yes. Previous work up includes: more than 5 years ago. Patient used to be on allergy injections? For 1 year with some benefit.  Previous ENT evaluation: no.  Assessment and Plan: [name] [name] is a 28 y.o. female with: Moderate persistent asthma Diagnosed with asthma over 20 years ago.  Using Symbicort 160 1 puff once or twice a day as needed and albuterol as needed with good benefit.  Tried Advair and Singulair in the past.  No recent hospitalization/ER/urgent care visits or prednisone use.  Today's spirometry showed consistent with possible restrictive disease with 11% improvement in FEV1 post bronchodilator treatment.  Patient felt clinically improved as well. . Daily controller medication(s):  Start Singulair 10mg  daily. o Symbicort 160 1-2 puffs twice a day with spacer and rinse mouth afterwards on a regular basis. . Prior to physical activity: May use albuterol rescue inhaler 2 puffs 5 to 15 minutes prior to strenuous physical activities. Marland Kitchen Rescue medications: May use albuterol rescue inhaler 2 puffs or nebulizer every 4 to 6 hours as needed for shortness of breath, chest tightness, coughing, and wheezing. Monitor frequency of use.   Other allergic rhinitis Perennial rhinoconjunctivitis symptoms for the past 10 years but worse the last year.  Symptoms seem to flare during the spring.  Tried Flonase and Claritin with some benefit.  Used to be on allergy injections possibly for a year with some benefit as a child.  No previous ENT evaluation.  Today's skin testing showed: Positive to grass, tree, ragweed, weed, dust mites, dog, cat, mold, horse, mouse.  Discussed environmental control measures.  May use over the counter antihistamines such as Zyrtec (cetirizine), Claritin (loratadine),  Allegra (fexofenadine), or Xyzal (levocetirizine) daily as needed.  May use Flonase 1 spray 1-2 times a day as needed.  Start  Singulair 10mg  daily.  Other atopic dermatitis Eczema since a young child.  Currently using clobetasol almost daily on the face with good benefit.  Has tried Elidel and hydrocortisone in the past with no improvement in symptoms.  Patient has patches of hyperpigmented with leathery/hyperkeratotic skin changes on popliteal fossa b/l and hands.   Given patient's long term issues with eczema we discussed at length the risks and benefits of Dupixent. Patient was agreeable to starting and a sample of Dupixent was given in the office. Patient tolerated it well with no issues. Patient was shown how to administer and self-administered 1 of the 2 injections.   Also discussed that clobetasol should NOT be used above neck as it can cause long term damage to the skin.   Discussed proper skin care measures.  Medications: . Only apply to affected areas that are "rough and red" . Face: hydrocortisone 2.5% ointment twice daily as needed to affected areas (up to 7 days in a row); stop when clear and can restart as needed for flares. . May use Eucrisa twice a day for 28 days for mild flares on face and body. Body:  . Thick, stubborn areas (legs and feet): mometasone 0.1% ointment twice daily as needed to affected areas (up to 3 weeks in a row); stop when clear and can restart as needed for flares. . For more than twice a day use the following: Aquaphor, Vaseline, Cerave, Cetaphil, Eucerin, Vanicream.  Itching: . Take Zyrtec 10mg  in the morning  Adverse food reaction Reactions to peanuts and shellfish in the past but able to eat shrimp with no issues.  Today's skin testing showed: peanuts, tree nuts, sesame.  Continue to avoid peanuts, tree nuts, sesame and shellfish.  The patients history suggests shellfish allergy, though todays skin tests were negative despite a positive histamine control.  Food allergen skin testing has excellent negative predictive value however there is still a 5% chance that the  allergy exists. Therefore, we will investigate further with serum specific IgE levels and, if negative then schedule for open graded oral food challenge.  A laboratory order form has been provided for serum specific IgE against shellfish.  Until the food allergy has been definitively ruled out, the patient is to continue meticulous avoidance of peanuts, tree nuts, sesame and shellfish (May eat shrimp as before).  Food action plan given.   I have prescribed epinephrine injectable and demonstrated proper use. For mild symptoms you can take over the counter antihistamines such as Benadryl and monitor symptoms closely. If symptoms worsen or if you have severe symptoms including breathing issues, throat closure, significant swelling, whole body hives, severe diarrhea and vomiting, lightheadedness then inject epinephrine and seek immediate medical care afterwards.  Return in about 2 months (around 12/13/2018).  Meds ordered this encounter  Medications  . EPINEPHrine (AUVI-Q) 0.3 mg/0.3 mL IJ SOAJ injection    Sig: Inject 0.3 mLs (0.3 mg total) into the muscle once for 1 dose.    Dispense:  2 Device    Refill:  1  . DISCONTD: budesonide-formoterol (SYMBICORT) 160-4.5 MCG/ACT inhaler    Sig: Inhale 2 puffs into the lungs 2 (two) times daily.    Dispense:  1 Inhaler    Refill:  6  . DISCONTD: fluticasone (FLONASE) 50 MCG/ACT nasal spray    Sig: Place 2 sprays into both nostrils daily. FOR  NASAL CONGESTION    Dispense:  16 g    Refill:  0  . montelukast (SINGULAIR) 10 MG tablet    Sig: Take 1 tablet (10 mg total) by mouth at bedtime.    Dispense:  30 tablet    Refill:  5  . Crisaborole (EUCRISA) 2 % OINT    Sig: Apply 1 application topically 2 (two) times daily as needed.    Dispense:  100 g    Refill:  3  . mometasone (ELOCON) 0.1 % ointment    Sig: Apply topically 2 (two) times daily. Below the neck up to 3 weeks a time. May use for flares.    Dispense:  45 g    Refill:  3  .  hydrocortisone 2.5 % ointment    Sig: Apply topically 2 (two) times daily. May use on the face up to 7 days at a time.    Dispense:  30 g    Refill:  3  . budesonide-formoterol (SYMBICORT) 160-4.5 MCG/ACT inhaler    Sig: Inhale 2 puffs into the lungs 2 (two) times daily.    Dispense:  1 Inhaler    Refill:  5  . fluticasone (FLONASE) 50 MCG/ACT nasal spray    Sig: Place 2 sprays into both nostrils daily. 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 The reaction occurred at the age of 28, after she ate small amount of nut cookie. Symptoms started within minutes and was in the form of throat closure and neck swelling. The symptoms lasted for 2 hours. She was evaluated in ED. Since this episode, she does not report other accidental exposures to peanuts. She does not have access to epinephrine autoinjector.  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 The reaction occurred at the age of 28. Patient broke out in hives/rash. Took a few days for it to resolve.   Past work up includes: not recently. Dietary History: patient has been eating other foods including milk, eggs, seafood, soy, wheat, meats, fruits and vegetables. No issues with shrimp.   She reports reading labels and avoiding shellfish, peanuts, tree nuts in diet completely.  Medication allergy: yes  Penicillin - rash Latex - rash, mouth swelling.  Hymenoptera allergy: no Urticaria: yes Eczema:yes  Patient uses clobetasol daily even on the face. She tried hydrocortisone and Elidel in the past with no benefit.  She used to follow with dermatology but no recent visit. Had eczema since a young child.  History of recurrent infections suggestive of immunodeficency: no  Diagnostics: Spirometry:  Tracings reviewed. Her effort: Good  reproducible efforts. FVC: 2.39L FEV1: 2.32L, 73% predicted FEV1/FVC ratio: 97% Interpretation: Spirometry consistent with possible restrictive disease with 11% improvement in FEV1 post bronchodilator treatment. Please see scanned spirometry results for details.  Skin Testing: Environmental allergy panel and select foods. Positive test to: grass, tree, ragweed, weed, dust mites, dog, cat, mold, horse, mouse, peanuts, sesame and tree nuts. Results discussed with patient/family. Airborne Adult Perc - 10/14/18 1113    Time Antigen Placed  1053    Allergen Manufacturer  Waynette ButteryGreer    Location  Back    Number of Test  59    Panel 1  Select    1. Control-Buffer 50% Glycerol  Negative    2. Control-Histamine 1 mg/ml  3+    3. Albumin saline  Negative    4. Bahia  2+    5. [place]  2+    6. Johnson  2+    7. Kentucky Blue  Negative    8. Meadow Fescue  Negative    9. Perennial Rye  Negative    10. Sweet Vernal  Negative    11. Timothy  Negative    12. Cocklebur  2+    13. Burweed Marshelder  2+    14. Ragweed, short  4+    15. Ragweed, Giant  3+    16. Plantain,  English  2+    17. Lamb's Quarters  2+    18. Sheep Sorrell  Negative    19. Rough Pigweed  2+    20. Marsh Elder, Rough  2+    21. Mugwort, Common  4+    22. Ash mix  2+    23. Birch mix  2+    24. Beech American  Negative    25. Box, Elder  Negative    26. Cedar, red  Negative    27. Cottonwood, Guinea-Bissau  Negative    28. Elm mix  Negative    29. Hickory mix  4+    30. Maple mix  Negative    31. Oak, Guinea-Bissau mix  4+    32. Pecan Pollen  4+    33. Pine mix  Negative    34. Sycamore Eastern  2+    35. Walnut, Black Pollen  4+    36. Alternaria alternata  Negative    37. Cladosporium Herbarum  Negative    38. Aspergillus mix  Negative    39. Penicillium mix  Negative    40. Bipolaris sorokiniana (Helminthosporium)  Negative    41. Drechslera spicifera (Curvularia)  Negative    42. Mucor plumbeus  Negative    43.  Fusarium moniliforme  Negative    44. Aureobasidium pullulans (pullulara)  Negative    45. Rhizopus oryzae  Negative    46. Botrytis cinera  Negative    47. Epicoccum nigrum  Negative    48. Phoma betae  Negative    49. Candida Albicans  Negative    50. Trichophyton mentagrophytes  Negative    51. Mite, D Farinae  5,000 AU/ml  4+    52. Mite, D Pteronyssinus  5,000 AU/ml  4+    53. Cat Hair 10,000 BAU/ml  Negative    54.  Dog Epithelia  4+    55. Mixed Feathers  Negative    56. Horse Epithelia  4+    57. Cockroach, German  Negative    58. Mouse  4+    59. Tobacco Leaf  --   +/-    Intradermal - 10/14/18 1352    Time Antigen Placed  1130    Allergen Manufacturer  Waynette Buttery    Location  Arm    Number of Test  7    Intradermal  Select    Control  Negative    Mold 1  2+    Mold 2  3+    Mold 3  3+    Mold 4  4+    Cat  4+    Cockroach  4+     Food Adult Perc - 10/14/18 1100    Time Antigen Placed  1105    Allergen Manufacturer  Waynette Buttery    Location  Back     Control-buffer 50% Glycerol  Negative    Control-Histamine 1 mg/ml  3+    1. Peanut  4+   47x10   2. Soybean  Negative    3. Wheat  Negative    4. Sesame  4+   10x5   5. Milk, cow  Negative    6. Egg White, Chicken  --   +/-   7. Casein  Negative    8. Shellfish Mix  Negative    9. Fish Mix  Negative    10. Cashew  4+   20x10   11. Pecan Food  4+   15x5   12. Walnut Food  4+   15x6   13. Almond  2+   2x2   14. Hazelnut  4+   16x5   15. [place] nut  4+   20x8   16. Coconut  Negative    17. Pistachio  4+   13x5   25. Shrimp  Negative    26. Crab  Negative    27. Lobster  Negative    28. Oyster  Negative    29. Scallops  Negative       Past Medical History: Patient Active Problem List   Diagnosis Date Noted  . Moderate persistent asthma 10/14/2018  . Adverse food reaction 10/14/2018  . Asthma 08/21/2013  . Influenza-like illness 08/21/2013  . Food allergy 09/01/2012  . 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 Routine general medical examination at a health care facility 04/03/2011  . 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 Other allergic rhinitis 07/29/2007  . 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 Other atopic dermatitis 07/29/2007   Past Medical History:  Diagnosis Date  . Allergic rhinitis   . Allergy   . Asthma    hosp 06/2009  . Eczema   . Kidney stone    right ER 09/17/09, 707/2012   Past Surgical History: Past Surgical History:  Procedure Laterality Date  . dental extractions     Medication List:  Current Outpatient Medications  Medication Sig Dispense Refill  . albuterol (PROVENTIL HFA;VENTOLIN HFA) 108 (90 Base) MCG/ACT inhaler Inhale 1-2 puffs into the lungs every 6 (six) hours as needed for wheezing or shortness of breath. 1 Inhaler 0  . budesonide-formoterol (SYMBICORT) 160-4.5 MCG/ACT inhaler Inhale 2 puffs into the lungs 2 (two) times daily. 1 Inhaler 5  . clobetasol ointment (TEMOVATE) 0.05 % Apply 1 application topically 2 (two) times daily. 30 g 5  . fluticasone (FLONASE) 50 MCG/ACT nasal spray Place 2 sprays into both nostrils daily. FOR NASAL CONGESTION 16 g 5  . loratadine (CLARITIN) 10 MG tablet Take 1 tablet (10 mg total) by mouth daily. 30 tablet 0  . Crisaborole (EUCRISA) 2 % OINT Apply 1 application topically 2 (two) times daily as needed. 100 g 3  . EPINEPHrine (AUVI-Q) 0.3 mg/0.3 mL IJ SOAJ injection Inject 0.3 mLs (0.3 mg total) into the muscle once for 1 dose. 2 Device 1  . hydrocortisone 2.5 % ointment Apply topically 2 (two) times daily. May use on the face up to 7 days at a time. 30 g 3  . mometasone (ELOCON) 0.1 % ointment Apply topically 2 (two) times daily. Below the neck up to 3 weeks a time. May use for flares. 45 g 3  . montelukast (SINGULAIR) 10 MG tablet Take 1 tablet (10 mg total) by mouth at bedtime. 30 tablet 5   No current facility-administered medications for this visit.    Allergies:  Allergies  Allergen Reactions  . Peanut-Containing Drug  Products Anaphylaxis    REACTION: unspecified  . Shrimp [Shellfish Allergy] Hives  . Latex Hives  . Penicillins Hives   Social History: Social History   Socioeconomic History  . Marital status: Single    Spouse name: Not on file  . Number of children: Not on file  . Years of education: Not on file  . Highest education level: Not on file  Occupational History  . Not on file  Social Needs  . Financial resource strain: Not on file  . Food insecurity:    Worry: Not on file    Inability: Not on file  . Transportation needs:    Medical: Not on file    Non-medical: Not on file  Tobacco Use  . Smoking status: Current Some Day Smoker    Types: Cigarettes  . Smokeless tobacco: Never Used  Substance and Sexual Activity  . Alcohol use: Yes    Comment: wine, liquor,beer on weekends  . Drug use: No  . Sexual activity: Yes  Lifestyle  . Physical activity:    Days per week: Not on file    Minutes per session: Not on file  . Stress: Not on file  Relationships  . Social connections:    Talks on phone: Not on file    Gets together: Not on file    Attends religious service: Not on file    Active member of club or organization: Not on file    Attends meetings of clubs or organizations: Not on file    Relationship status: Not on file  Other Topics Concern  . Not on file  Social History Narrative  . Not on file   Lives in a 28 year old home. Smoking: socially Occupation: Neurosurgeon HistorySurveyor, minerals in the house: no Carpet in the family room: no Carpet in the bedroom: yes Heating: electric Cooling: central Pet: no  Family History: Family History  Problem Relation Age of Onset  . Asthma Mother   . Eczema Mother   . Asthma Other        uncle  . Eczema Other        uncle   Review of Systems  Constitutional: Negative for appetite change, chills, fever and unexpected weight change.  HENT: Positive for congestion and rhinorrhea.     Eyes: Positive for itching.  Respiratory: Negative for cough, chest tightness, shortness of breath and wheezing.   Cardiovascular: Negative for chest pain.  Gastrointestinal: Negative for abdominal pain.  Genitourinary: Negative for difficulty urinating.  Skin: Positive for rash.  Allergic/Immunologic: Positive for environmental allergies and food allergies.  Neurological: Positive for headaches.   Objective: BP 130/70   Pulse 80   Temp 98.3 F (36.8 C)   Resp 20   Ht 5\' 8"  (1.727 m)   Wt 289 lb 6.4 oz (131.3 kg)   BMI 44.00 kg/m  Body mass index is 44 kg/m. Physical Exam  Constitutional: She is oriented to person, place, and time. She appears well-developed and well-nourished.  HENT:  Head: Normocephalic and atraumatic.  Right Ear: External ear normal.  Left Ear: External ear normal.  Nose: Nose normal.  Mouth/Throat: Oropharynx is clear and moist.  Eyes: Conjunctivae and EOM are normal.  Neck: Neck supple.  Cardiovascular: Normal rate, regular rhythm and normal heart sounds. Exam reveals no gallop and no friction rub.  No murmur heard. Pulmonary/Chest: Effort normal and breath sounds normal. She has no wheezes.  She has no rales.  Abdominal: Soft.  Lymphadenopathy:    She has no cervical adenopathy.  Neurological: She is alert and oriented to person, place, and time.  Skin: Skin is warm and dry. Rash noted.  Hyperpigmented and hyperkeratotic changes on posterior neck area, popliteal fossa b/l.  Erythematous face  Leathery changes on hands b/l.  Psychiatric: She has a normal mood and affect. Her behavior is normal.  Nursing note and vitals reviewed.  The plan was reviewed with the patient/family, and all questions/concerned were addressed.  It was my pleasure to see Addylynn today and participate in her care. Please feel free to contact me with any questions or concerns.  Sincerely,  Wyline Mood, DO Allergy & Immunology  [facility] office: [phone] [facility] office: [phone]  80 minutes spent face-to-face with more than 50% of the time spent discussing eczema, food and environmental allergies, asthma.

## 2018-10-14 NOTE — Addendum Note (Signed)
Addended by: Shona Simpson A on: 10/14/2018 03:22 PM   Modules accepted: Orders

## 2018-10-15 ENCOUNTER — Other Ambulatory Visit: Payer: Self-pay | Admitting: *Deleted

## 2018-10-15 MED ORDER — MOMETASONE FUROATE 0.1 % EX OINT: TOPICAL_OINTMENT | Freq: Two times a day (BID) | CUTANEOUS | 3 refills | Status: DC

## 2018-10-15 MED ORDER — FLUTICASONE PROPIONATE 50 MCG/ACT NA SUSP: 2.0000 | Freq: Every day | NASAL | 5 refills | Status: DC

## 2018-10-15 MED ORDER — CRISABOROLE 2 % EX OINT: 1.0000 "application " | TOPICAL_OINTMENT | Freq: Two times a day (BID) | CUTANEOUS | 3 refills | Status: DC | PRN

## 2018-10-15 MED ORDER — HYDROCORTISONE 2.5 % EX OINT: TOPICAL_OINTMENT | Freq: Two times a day (BID) | CUTANEOUS | 3 refills | Status: DC

## 2018-10-15 MED ORDER — MONTELUKAST SODIUM 10 MG PO TABS: 10.0000 mg | ORAL_TABLET | Freq: Every day | ORAL | 5 refills | Status: DC

## 2018-10-15 NOTE — Telephone Encounter (Signed)
I did advise patient of her coverage for drugs and need to sign dupmyway form to see if we can get assistance

## 2018-10-15 NOTE — Telephone Encounter (Signed)
I L/M for patient to contact me. She should come by office to sign Dupixent My Way enrollment for dermatitis

## 2018-10-15 NOTE — Telephone Encounter (Signed)
Patient called and requested Rx sent to [facility] because she was advised cheaper there but not sure what rx was filled there that might have been cheaper. She stated she contacted them to fill all the rx there but didn't have them but not sure why since the other pharmacy was a [facility]  I did advise her to call the friendly location to have them put those rx already filled back

## 2018-10-16 LAB — ALLERGEN PROFILE, SHELLFISH
Clam IgE: 0.11 kU/L — AB
F023-IGE CRAB: 0.14 kU/L — AB
F080-IgE Lobster: 0.22 kU/L — AB
SHRIMP IGE: 0.54 kU/L — AB
Scallop IgE: 0.13 kU/L — AB

## 2018-10-16 LAB — CBC WITH DIFFERENTIAL/PLATELET
Basophils Absolute: 0 10*3/uL (ref 0.0–0.2)
Basos: 1 %
EOS (ABSOLUTE): 0.6 10*3/uL — AB (ref 0.0–0.4)
Eos: 8 %
Hematocrit: 42.1 % (ref 34.0–46.6)
Hemoglobin: 13.5 g/dL (ref 11.1–15.9)
Immature Grans (Abs): 0 10*3/uL (ref 0.0–0.1)
Immature Granulocytes: 0 %
LYMPHS ABS: 1.5 10*3/uL (ref 0.7–3.1)
Lymphs: 22 %
MCH: 27.4 pg (ref 26.6–33.0)
MCHC: 32.1 g/dL (ref 31.5–35.7)
MCV: 86 fL (ref 79–97)
Monocytes Absolute: 0.6 10*3/uL (ref 0.1–0.9)
Monocytes: 9 %
NEUTROS ABS: 4 10*3/uL (ref 1.4–7.0)
Neutrophils: 60 %
PLATELETS: 350 10*3/uL (ref 150–450)
RBC: 4.92 x10E6/uL (ref 3.77–5.28)
RDW: 12.5 % (ref 11.7–15.4)
WBC: 6.7 10*3/uL (ref 3.4–10.8)

## 2018-10-17 ENCOUNTER — Other Ambulatory Visit: Payer: Self-pay | Admitting: *Deleted

## 2018-10-17 MED ORDER — CRISABOROLE 2 % EX OINT: 1.0000 "application " | TOPICAL_OINTMENT | Freq: Two times a day (BID) | CUTANEOUS | 3 refills | Status: DC | PRN

## 2018-10-17 NOTE — Telephone Encounter (Signed)
Resent Eucrisa to [facility] to get it cheaper

## 2019-01-29 ENCOUNTER — Telehealth: Payer: Self-pay | Admitting: *Deleted

## 2019-01-29 NOTE — Telephone Encounter (Signed)
Patient called and advised she has been working reduced hours and no longer has insurance coverage.  I advised patient that we could submit her for assistance thru Dupixent My Way. I already have signed form and advised her to get me 2 paycheck stubs to submit.  Since she is out of medication I have advised her to go by [facility] clinic and get sample until we can get her assistance.  I call the clinic and advised them to put sample aside for her with her name on it and she will come by to pick same up

## 2019-11-02 ENCOUNTER — Other Ambulatory Visit: Payer: Self-pay | Admitting: Allergy

## 2019-11-05 ENCOUNTER — Other Ambulatory Visit: Payer: Self-pay

## 2019-11-05 ENCOUNTER — Ambulatory Visit (INDEPENDENT_AMBULATORY_CARE_PROVIDER_SITE_OTHER): Payer: Self-pay | Admitting: Allergy

## 2019-11-05 ENCOUNTER — Encounter: Payer: Self-pay | Admitting: Allergy

## 2019-11-05 VITALS — BP 120/78 | HR 79 | Temp 97.8°F | Resp 16 | Ht 69.0 in | Wt 318.0 lb

## 2019-11-05 DIAGNOSIS — T781XXD Other adverse food reactions, not elsewhere classified, subsequent encounter: Secondary | ICD-10-CM

## 2019-11-05 DIAGNOSIS — J3089 Other allergic rhinitis: Secondary | ICD-10-CM

## 2019-11-05 DIAGNOSIS — J302 Other seasonal allergic rhinitis: Secondary | ICD-10-CM

## 2019-11-05 DIAGNOSIS — L2089 Other atopic dermatitis: Secondary | ICD-10-CM

## 2019-11-05 DIAGNOSIS — J4540 Moderate persistent asthma, uncomplicated: Secondary | ICD-10-CM

## 2019-11-05 DIAGNOSIS — H1010 Acute atopic conjunctivitis, unspecified eye: Secondary | ICD-10-CM | POA: Insufficient documentation

## 2019-11-05 NOTE — Progress Notes (Signed)
Follow Up Note  RE: ZOHAL RENY MRN: [id number] DOB: 06/12/1991 Date of Office Visit: 11/05/2019  Referring provider: No ref. provider found Primary care provider: System, Pcp Not In  Chief Complaint: Asthma and Eczema  History of Present Illness: I had the pleasure of seeing Ryenn Howeth for a follow up visit at the [facility] on 11/05/2019. She is a 29 y.o. female, who is being followed for asthma, allergic rhinoconjunctivitis, atopic dermatitis and food allergies. Her previous allergy office visit was on 10/14/2018 with Dr. Selena Batten. Today is a regular follow up visit.  Moderate persistent asthma Currently on Symbicort 160 2 puffs BID with good benefit.  Denies any SOB, coughing, wheezing, chest tightness, nocturnal awakenings, ER/urgent care visits or prednisone use since the last visit.  Other allergic rhinitis Currently on Claritin daily.  Not needed to use eye drops or nasal sprays.   Other atopic dermatitis Currently on Dupixent injections every 2 weeks with good benefit. Using mometasone if needed about once every 2 months. Moisturizing daily.   Adverse food reaction Avoiding peanuts, tree nuts, sesame and shellfish. No accidental ingestions.   Assessment and Plan: Xiao is a 29 y.o. female with: Moderate persistent asthma Past history - Diagnosed with asthma over 20 years ago.  Using Symbicort 160 1 puff once or twice a day as needed and albuterol as needed with good benefit.  Tried Advair and Singulair in the past.  2020 spirometry showed consistent with possible restrictive disease with 11% improvement in FEV1 post bronchodilator treatment.  Patient felt clinically improved as well. Interim history - well controlled with below regimen. Did not start Singulair. On Dupixent for atopic dermatitis.   Today's spirometry was much better than last one. ACT score 25.   Daily controller medication(s):  ? Continue Symbicort 160 1-2 puffs  twice a day with spacer and rinse mouth afterwards on a regular basis.  Prior to physical activity:May use albuterol rescue inhaler 2 puffs 5 to 15 minutes prior to strenuous physical activities.  Rescue medications:May use albuterol rescue inhaler 2 puffs or nebulizer every 4 to 6 hours as needed for shortness of breath, chest tightness, coughing, and wheezing. Monitor frequency of use.   Seasonal and perennial allergic rhinoconjunctivitis Past history - Perennial rhinoconjunctivitis symptoms for the past 10 years but worse the last year.  Symptoms seem to flare during the spring.  Tried Flonase and Claritin with some benefit.  Used to be on allergy injections possibly for a year with some benefit as a child.  No previous ENT evaluation. 2020 skin testing showed: Positive to grass, tree, ragweed, weed, dust mites, dog, cat, mold, horse, mouse.   Interim history - controlled with Claritin daily.  Continue environmental control measures.  May use over the counter antihistamines such as Zyrtec (cetirizine), Claritin (loratadine), Allegra (fexofenadine), or Xyzal (levocetirizine) daily as needed.  May use Flonase 1 spray 1-2 times a day as needed.  Other atopic dermatitis Past history - Eczema since a young child.  Currently using clobetasol almost daily on the face with good benefit.  Has tried Elidel and hydrocortisone in the past with no improvement in symptoms.  Patient has patches of hyperpigmented with leathery/hyperkeratotic skin changes on popliteal fossa b/l and hands.  Interim history - Much improved with Dupixent Q2 weeks at home injection.  Continue Dupixent injections every 2 weeks - you may inject in your abdominal area or upper thigh.   Continue proper skin care measures.  Medications:  Only apply to affected areas that are "rough and red" Body:   Thick, stubborn areas (legs and feet): mometasone 0.1% ointment twice daily as needed to affected areas (up to 3 weeks in a row);  stop when clear and can restart as needed for flares.  For more than twice a day use the following: Aquaphor, Vaseline, Cerave, Cetaphil, Eucerin, Vanicream.  Itching:  Take Zyrtec 10mg  in the morning  Adverse food reaction Past history - Reactions to peanuts and shellfish in the past but able to eat shrimp with no issues. 2020 skin testing showed: peanuts, tree nuts, sesame. 2020 immunocap borderline positive to shellfish.  Interim history - No accidental ingestions.   Continue to avoid peanuts, tree nuts, sesame and shellfish.  For mild symptoms you can take over the counter antihistamines such as Benadryl and monitor symptoms closely. If symptoms worsen or if you have severe symptoms including breathing issues, throat closure, significant swelling, whole body hives, severe diarrhea and vomiting, lightheadedness then inject epinephrine and seek immediate medical care afterwards.  Consider shellfish in office food challenge.  Return in about 6 months (around 05/07/2020).  Diagnostics: Spirometry:  Tracings reviewed. Her effort: Good reproducible efforts. FVC: 3.37L FEV1: 2.72L, 85% predicted FEV1/FVC ratio: 81% Interpretation: Spirometry consistent with normal pattern.  Please see scanned spirometry results for details.  Medication List:  Current Outpatient Medications  Medication Sig Dispense Refill  . albuterol (PROVENTIL HFA;VENTOLIN HFA) 108 (90 Base) MCG/ACT inhaler Inhale 1-2 puffs into the lungs every 6 (six) hours as needed for wheezing or shortness of breath. 1 Inhaler 0  . budesonide-formoterol (SYMBICORT) 160-4.5 MCG/ACT inhaler Inhale 2 puffs into the lungs 2 (two) times daily. 1 Inhaler 5  . hydrocortisone 2.5 % ointment Apply topically 2 (two) times daily. May use on the face up to 7 days at a time. 30 g 3  . loratadine (CLARITIN) 10 MG tablet Take 1 tablet (10 mg total) by mouth daily. 30 tablet 0  . mometasone (ELOCON) 0.1 % ointment Apply topically 2 (two) times  daily. Below the neck up to 3 weeks a time. May use for flares. 45 g 3  . fluticasone (FLONASE) 50 MCG/ACT nasal spray Place 2 sprays into both nostrils daily. FOR NASAL CONGESTION (Patient not taking: Reported on 11/05/2019) 16 g 5   No current facility-administered medications for this visit.   Allergies: Allergies  Allergen Reactions  . Peanut-Containing Drug Products Anaphylaxis    REACTION: unspecified  . Shrimp [Shellfish Allergy] Hives  . Latex Hives  . Penicillins Hives   I reviewed her past medical history, social history, family history, and environmental history and no significant changes have been reported from her previous visit.  Review of Systems  Constitutional: Negative for appetite change, chills, fever and unexpected weight change.  HENT: Negative for congestion and rhinorrhea.   Eyes: Negative for itching.  Respiratory: Negative for cough, chest tightness, shortness of breath and wheezing.   Gastrointestinal: Negative for abdominal pain.  Skin: Negative for rash.  Allergic/Immunologic: Positive for environmental allergies and food allergies.  Neurological: Negative for headaches.   Objective: BP 120/78 (BP Location: Right Arm, Patient Position: Sitting, Cuff Size: Large)   Pulse 79   Temp 97.8 F (36.6 C) (Temporal)   Resp 16   Ht 5\' 9"  (1.753 m)   Wt (!) 318 lb (144.2 kg)   SpO2 97%   BMI 46.96 kg/m  Body mass index is 46.96 kg/m. Physical Exam  Constitutional: She is oriented to person, place,  and time. She appears well-developed and well-nourished.  HENT:  Head: Normocephalic and atraumatic.  Right Ear: External ear normal.  Left Ear: External ear normal.  Nose: Nose normal.  Mouth/Throat: Oropharynx is clear and moist.  Eyes: Conjunctivae and EOM are normal.  Cardiovascular: Normal rate, regular rhythm and normal heart sounds. Exam reveals no gallop and no friction rub.  No murmur heard. Pulmonary/Chest: Effort normal and breath sounds normal.  She has no wheezes. She has no rales.  Abdominal: Soft.  Musculoskeletal:     Cervical back: Neck supple.  Neurological: She is alert and oriented to person, place, and time.  Skin: Skin is warm and dry. Rash noted.  Skin is much improved. Some patches of hypopigmented areas on upper extremities b/l.  Psychiatric: She has a normal mood and affect. Her behavior is normal.  Nursing note and vitals reviewed.  Previous notes and tests were reviewed. The plan was reviewed with the patient/family, and all questions/concerned were addressed.  It was my pleasure to see Wilene today and participate in her care. Please feel free to contact me with any questions or concerns.  Sincerely,  Wyline Mood, DO Allergy & Immunology  [facility] office: [phone] [facility] office: [phone] [place] office: [phone]

## 2019-11-05 NOTE — Assessment & Plan Note (Signed)
Past history - Diagnosed with asthma over 20 years ago.  Using Symbicort 160 1 puff once or twice a day as needed and albuterol as needed with good benefit.  Tried Advair and Singulair in the past.  2020 spirometry showed consistent with possible restrictive disease with 11% improvement in FEV1 post bronchodilator treatment.  Patient felt clinically improved as well. Interim history - well controlled with below regimen. Did not start Singulair. On Dupixent for atopic dermatitis.   Today's spirometry was much better than last one. ACT score 25.   Daily controller medication(s):  ? Continue Symbicort 160 1-2 puffs twice a day with spacer and rinse mouth afterwards on a regular basis.  Prior to physical activity:May use albuterol rescue inhaler 2 puffs 5 to 15 minutes prior to strenuous physical activities.  Rescue medications:May use albuterol rescue inhaler 2 puffs or nebulizer every 4 to 6 hours as needed for shortness of breath, chest tightness, coughing, and wheezing. Monitor frequency of use.

## 2019-11-05 NOTE — Assessment & Plan Note (Signed)
Past history - Perennial rhinoconjunctivitis symptoms for the past 10 years but worse the last year.  Symptoms seem to flare during the spring.  Tried Flonase and Claritin with some benefit.  Used to be on allergy injections possibly for a year with some benefit as a child.  No previous ENT evaluation. 2020 skin testing showed: Positive to grass, tree, ragweed, weed, dust mites, dog, cat, mold, horse, mouse.   Interim history - controlled with Claritin daily.  Continue environmental control measures.  May use over the counter antihistamines such as Zyrtec (cetirizine), Claritin (loratadine), Allegra (fexofenadine), or Xyzal (levocetirizine) daily as needed.  May use Flonase 1 spray 1-2 times a day as needed.

## 2019-11-05 NOTE — Patient Instructions (Addendum)
Moderate persistent asthma  Today's spirometry was normal.  Daily controller medication(s):  ? Continue Symbicort 160 1-2 puffs twice a day with spacer and rinse mouth afterwards on a regular basis.  Prior to physical activity:May use albuterol rescue inhaler 2 puffs 5 to 15 minutes prior to strenuous physical activities.  Rescue medications:May use albuterol rescue inhaler 2 puffs or nebulizer every 4 to 6 hours as needed for shortness of breath, chest tightness, coughing, and wheezing. Monitor frequency of use.  Asthma control goals:  Full participation in all desired activities (may need albuterol before activity) Albuterol use two times or less a week on average (not counting use with activity) Cough interfering with sleep two times or less a month Oral steroids no more than once a year No hospitalizations  Other allergic rhinitis  Past skin testing showed: Positive to grass, tree, ragweed, weed, dust mites, dog, cat, mold, horse, mouse.    Continue environmental control measures.  May use over the counter antihistamines such as Zyrtec (cetirizine), Claritin (loratadine), Allegra (fexofenadine), or Xyzal (levocetirizine) daily as needed.  May use Flonase 1 spray 1-2 times a day as needed.  Other atopic dermatitis  Continue dupixent injections every 2 weeks - you may inject in your abdominal area or upper thigh.   Continue proper skin care measures.  Medications:  Only apply to affected areas that are "rough and red" Body:   Thick, stubborn areas (legs and feet): mometasone 0.1% ointment twice daily as needed to affected areas (up to 3 weeks in a row); stop when clear and can restart as needed for flares.  For more than twice a day use the following: Aquaphor, Vaseline, Cerave, Cetaphil, Eucerin, Vanicream.  Itching:  Take Zyrtec 10mg  in the morning  Adverse food reaction  Past skin testing showed: peanuts, tree nuts, sesame.  Continue to avoid peanuts, tree  nuts, sesame and shellfish.  For mild symptoms you can take over the counter antihistamines such as Benadryl and monitor symptoms closely. If symptoms worsen or if you have severe symptoms including breathing issues, throat closure, significant swelling, whole body hives, severe diarrhea and vomiting, lightheadedness then inject epinephrine and seek immediate medical care afterwards.  Follow up in 6 months or sooner if needed.

## 2019-11-05 NOTE — Assessment & Plan Note (Signed)
Past history - Eczema since a young child.  Currently using clobetasol almost daily on the face with good benefit.  Has tried Elidel and hydrocortisone in the past with no improvement in symptoms.  Patient has patches of hyperpigmented with leathery/hyperkeratotic skin changes on popliteal fossa b/l and hands.  Interim history - Much improved with Dupixent Q2 weeks at home injection.  Continue Dupixent injections every 2 weeks - you may inject in your abdominal area or upper thigh.   Continue proper skin care measures.  Medications:  Only apply to affected areas that are "rough and red" Body:   Thick, stubborn areas (legs and feet): mometasone 0.1% ointment twice daily as needed to affected areas (up to 3 weeks in a row); stop when clear and can restart as needed for flares.  For more than twice a day use the following: Aquaphor, Vaseline, Cerave, Cetaphil, Eucerin, Vanicream.  Itching:  Take Zyrtec 10mg  in the morning

## 2019-11-05 NOTE — Assessment & Plan Note (Signed)
Past history - Reactions to peanuts and shellfish in the past but able to eat shrimp with no issues. 2020 skin testing showed: peanuts, tree nuts, sesame. 2020 immunocap borderline positive to shellfish.  Interim history - No accidental ingestions.   Continue to avoid peanuts, tree nuts, sesame and shellfish.  For mild symptoms you can take over the counter antihistamines such as Benadryl and monitor symptoms closely. If symptoms worsen or if you have severe symptoms including breathing issues, throat closure, significant swelling, whole body hives, severe diarrhea and vomiting, lightheadedness then inject epinephrine and seek immediate medical care afterwards.  Consider shellfish in office food challenge.

## 2019-11-10 ENCOUNTER — Other Ambulatory Visit: Payer: Self-pay | Admitting: Allergy

## 2019-11-10 MED ORDER — MOMETASONE FUROATE 0.1 % EX OINT: TOPICAL_OINTMENT | Freq: Two times a day (BID) | CUTANEOUS | 3 refills | Status: DC

## 2019-11-10 MED ORDER — BUDESONIDE-FORMOTEROL FUMARATE 160-4.5 MCG/ACT IN AERO: 2.0000 | INHALATION_SPRAY | Freq: Two times a day (BID) | RESPIRATORY_TRACT | 5 refills | Status: DC

## 2019-11-10 MED ORDER — HYDROCORTISONE 2.5 % EX OINT: TOPICAL_OINTMENT | Freq: Two times a day (BID) | CUTANEOUS | 3 refills | Status: DC

## 2019-11-10 NOTE — Telephone Encounter (Signed)
Patient called and said that the rx was not called into [facility] on [address] . The symbicort and the ointment mometasone or hydrocortisone. [phone].

## 2019-11-10 NOTE — Telephone Encounter (Signed)
I saw on last not patient has tried and fail Elidel and Hydrocortisone and was to start Dupixent. Is it okay to send them in again to her pharmacy or do you want patient to only do Dupixent?

## 2019-11-17 ENCOUNTER — Encounter: Payer: 59 | Admitting: Certified Nurse Midwife

## 2019-12-31 ENCOUNTER — Encounter: Payer: 59 | Admitting: Obstetrics and Gynecology

## 2020-03-08 ENCOUNTER — Encounter: Payer: Self-pay | Admitting: Obstetrics and Gynecology

## 2020-03-15 ENCOUNTER — Other Ambulatory Visit: Payer: Self-pay | Admitting: *Deleted

## 2020-03-15 MED ORDER — DUPIXENT 300 MG/2ML ~~LOC~~ SOSY: 300.0000 mg | PREFILLED_SYRINGE | SUBCUTANEOUS | 11 refills | Status: DC

## 2020-03-15 NOTE — Telephone Encounter (Signed)
Patient advised. Rx sent. 

## 2020-03-15 NOTE — Telephone Encounter (Signed)
Tried to contact patient to find out pharmacy she needs Rx refill for Dupixent sent to.  I believe that [facility] and will send refill when patient calls back like she advised when she l/m for me last week

## 2020-03-15 NOTE — Addendum Note (Signed)
Addended by: Devoria Glassing on: 03/15/2020 11:39 AM   Modules accepted: Orders

## 2020-03-16 ENCOUNTER — Telehealth: Payer: Self-pay | Admitting: Allergy

## 2020-03-16 NOTE — Telephone Encounter (Signed)
Patient called and would like to talk with a nurse about her Dupixent problem. Se would like to know if there is other option .[phone].

## 2020-03-17 NOTE — Telephone Encounter (Signed)
I spoke with patient. There is no issue with Dupixent. She wanted to make sure that you had received everything. She also wanted to know if there would be a way to receive an injection prior to getting the approval.

## 2020-03-19 NOTE — Telephone Encounter (Signed)
If we have multiple samples we can give her box if not she will need to wait for reapproval thru patient assistance

## 2020-03-19 NOTE — Telephone Encounter (Signed)
Yes we have a sample here in [place]. I have already placed her name on one for them.

## 2020-03-19 NOTE — Telephone Encounter (Signed)
How many samples do we have in [place]?

## 2020-03-19 NOTE — Telephone Encounter (Signed)
Awesome! Thank you so much!

## 2020-05-10 ENCOUNTER — Other Ambulatory Visit: Payer: Self-pay

## 2020-05-10 ENCOUNTER — Ambulatory Visit (INDEPENDENT_AMBULATORY_CARE_PROVIDER_SITE_OTHER): Payer: Self-pay | Admitting: Allergy

## 2020-05-10 ENCOUNTER — Encounter: Payer: Self-pay | Admitting: Allergy

## 2020-05-10 VITALS — BP 122/70 | HR 89 | Temp 97.7°F | Resp 18 | Wt 325.4 lb

## 2020-05-10 DIAGNOSIS — J3089 Other allergic rhinitis: Secondary | ICD-10-CM

## 2020-05-10 DIAGNOSIS — J302 Other seasonal allergic rhinitis: Secondary | ICD-10-CM

## 2020-05-10 DIAGNOSIS — J4540 Moderate persistent asthma, uncomplicated: Secondary | ICD-10-CM

## 2020-05-10 DIAGNOSIS — Z7189 Other specified counseling: Secondary | ICD-10-CM

## 2020-05-10 DIAGNOSIS — Z7185 Encounter for immunization safety counseling: Secondary | ICD-10-CM | POA: Insufficient documentation

## 2020-05-10 DIAGNOSIS — L2089 Other atopic dermatitis: Secondary | ICD-10-CM

## 2020-05-10 DIAGNOSIS — T781XXD Other adverse food reactions, not elsewhere classified, subsequent encounter: Secondary | ICD-10-CM

## 2020-05-10 DIAGNOSIS — H1010 Acute atopic conjunctivitis, unspecified eye: Secondary | ICD-10-CM

## 2020-05-10 MED ORDER — TRIAMCINOLONE ACETONIDE 0.1 % EX OINT: 1.0000 "application " | TOPICAL_OINTMENT | Freq: Two times a day (BID) | CUTANEOUS | 1 refills | Status: DC | PRN

## 2020-05-10 MED ORDER — BUDESONIDE-FORMOTEROL FUMARATE 80-4.5 MCG/ACT IN AERO: 1.0000 | INHALATION_SPRAY | Freq: Two times a day (BID) | RESPIRATORY_TRACT | 5 refills | Status: DC

## 2020-05-10 NOTE — Assessment & Plan Note (Signed)
Past history - Perennial rhinoconjunctivitis symptoms for the past 10 years but worse the last year.  Symptoms seem to flare during the spring.  Tried Flonase and Claritin with some benefit.  Used to be on allergy injections possibly for a year with some benefit as a child.  No previous ENT evaluation. 2020 skin testing showed: Positive to grass, tree, ragweed, weed, dust mites, dog, cat, mold, horse, mouse.   Interim history - well controlled with below regimen.  Continue environmental control measures.  May use over the counter antihistamines such as Zyrtec (cetirizine), Claritin (loratadine), Allegra (fexofenadine), or Xyzal (levocetirizine) daily as needed.  May use Flonase 1 spray 1-2 times a day as needed.

## 2020-05-10 NOTE — Assessment & Plan Note (Signed)
Past history - Eczema since a young child.  Currently using clobetasol almost daily on the face with good benefit.  Has tried Elidel and hydrocortisone in the past with no improvement in symptoms.  Patient has patches of hyperpigmented with leathery/hyperkeratotic skin changes on popliteal fossa b/l and hands.  Interim history - much improved with Dupixent.  Continue Dupixent injections every 2 weeks at home.   Continue proper skin care measures.  Medications:  Only apply to affected areas that are "rough and red" Body:   May use triamcinolone 0.1% ointment twice a day as needed for eczema flares. Do not use on the face, neck, armpits or groin area. Do not use more than 3 weeks in a row.   For more than twice a day use the following: Aquaphor, Vaseline, Cerave, Cetaphil, Eucerin, Vanicream.  Itching:  Take Zyrtec 10mg  in the morning as needed.

## 2020-05-10 NOTE — Assessment & Plan Note (Signed)
Past history - Diagnosed with asthma over 20 years ago.  Using Symbicort 160 1 puff once or twice a day as needed and albuterol as needed with good benefit.  Tried Advair and Singulair in the past.  2020 spirometry showed consistent with possible restrictive disease with 11% improvement in FEV1 post bronchodilator treatment.   Interim history - well controlled with Symbicort 1 puff BID. On Dupixent for atopic dermatitis.   Today's spirometry was normal. ACT score 25.   Daily controller medication(s):  ? Decrease to Symbicort 1 puff twice a day with spacer and rinse mouth afterwards on a regular basis. . During upper respiratory infections/asthma flares: start Symbicort 2 puffs twice a day for 1-2 weeks until your breathing symptoms return to baseline.  . May use albuterol rescue inhaler 2 puffs every 4 to 6 hours as needed for shortness of breath, chest tightness, coughing, and wheezing. May use albuterol rescue inhaler 2 puffs 5 to 15 minutes prior to strenuous physical activities. Monitor frequency of use.   Repeat spirometry at next visit.

## 2020-05-10 NOTE — Assessment & Plan Note (Signed)
   Recommend getting the COVID-19 vaccine.  

## 2020-05-10 NOTE — Progress Notes (Signed)
Follow Up Note  RE: [name] [name] MRN: [id number] DOB: 1991-04-01 Date of Office Visit: 05/10/2020  Referring provider: No ref. provider found Primary care provider: System, Pcp Not In  Chief Complaint: Follow-up and Asthma  History of Present Illness: I had the pleasure of seeing [name] [name] for a follow up visit at the [facility] on 05/10/2020. She is a 29 y.o. female, who is being followed for asthma, allergic rhino conjunctivitis, atopic dermatitis, adverse food reaction. Her previous allergy office visit was on 11/05/2019 with Dr. Selena Batten. Today is a regular follow up visit.  Moderate persistent asthma Denies any SOB, coughing, wheezing, chest tightness, nocturnal awakenings, ER/urgent care visits or prednisone use since the last visit.  Currently on Symbicort 1 puff Bid and rinsing mouth after each use. Not having to use albuterol at all.   Seasonal and perennial allergic rhinoconjunctivitis Takes Xyzal as needed and doing well.   Other atopic dermatitis Patient is Dupixent injections every 2 weeks at home and doing well on it. Skin is doing very well. Not using any topical steroid creams on a daily basis. There are areas of hypopigmentation on the hands, elbows and feet.  Using Vaseline as a moisturizer.   Adverse food reaction Avoiding peanuts, tree nuts, sesame and shellfish. Patient eats shrimp with no issues.   Assessment and Plan: [name] [name] is a 29 y.o. female with: Moderate persistent asthma without complication Past history - Diagnosed with asthma over 20 years ago.  Using Symbicort 160 1 puff once or twice a day as needed and albuterol as needed with good benefit.  Tried Advair and Singulair in the past.  2020 spirometry showed consistent with possible restrictive disease with 11% improvement in FEV1 post bronchodilator treatment.   Interim history - well controlled with Symbicort 1 puff BID. On Dupixent for atopic dermatitis.    Today's spirometry was normal. ACT score 25.   Daily controller medication(s):  ? Decrease to Symbicort 1 puff twice a day with spacer and rinse mouth afterwards on a regular basis. . During upper respiratory infections/asthma flares: start Symbicort 2 puffs twice a day for 1-2 weeks until your breathing symptoms return to baseline.  . May use albuterol rescue inhaler 2 puffs every 4 to 6 hours as needed for shortness of breath, chest tightness, coughing, and wheezing. May use albuterol rescue inhaler 2 puffs 5 to 15 minutes prior to strenuous physical activities. Monitor frequency of use.   Repeat spirometry at next visit.   Seasonal and perennial allergic rhinoconjunctivitis Past history - Perennial rhinoconjunctivitis symptoms for the past 10 years but worse the last year.  Symptoms seem to flare during the spring.  Tried Flonase and Claritin with some benefit.  Used to be on allergy injections possibly for a year with some benefit as a child.  No previous ENT evaluation. 2020 skin testing showed: Positive to grass, tree, ragweed, weed, dust mites, dog, cat, mold, horse, mouse.   Interim history - well controlled with below regimen.  Continue environmental control measures.  May use over the counter antihistamines such as Zyrtec (cetirizine), Claritin (loratadine), Allegra (fexofenadine), or Xyzal (levocetirizine) daily as needed.  May use Flonase 1 spray 1-2 times a day as needed.  Other atopic dermatitis Past history - Eczema since a young child.  Currently using clobetasol almost daily on the face with good benefit.  Has tried Elidel and hydrocortisone in the past with no improvement in symptoms.  Patient has  patches of hyperpigmented with leathery/hyperkeratotic skin changes on popliteal fossa b/l and hands.  Interim history - much improved with Dupixent.  Continue Dupixent injections every 2 weeks at home.   Continue proper skin care measures.  Medications:  Only  apply to affected areas that are "rough and red" Body:   May use triamcinolone 0.1% ointment twice a day as needed for eczema flares. Do not use on the face, neck, armpits or groin area. Do not use more than 3 weeks in a row.   For more than twice a day use the following: Aquaphor, Vaseline, Cerave, Cetaphil, Eucerin, Vanicream.  Itching:  Take Zyrtec 10mg  in the morning as needed.   Adverse food reaction Past history - Reactions to peanuts and shellfish in the past but able to eat shrimp with no issues. 2020 skin testing showed: peanuts, tree nuts, sesame. 2020 immunocap borderline positive to shellfish.  Interim history - No accidental ingestions. Tolerates shrimp.   Continue to avoid peanuts, tree nuts, sesame and shellfish. Okay to eat shrimp as before.   For mild symptoms you can take over the counter antihistamines such as Benadryl and monitor symptoms closely. If symptoms worsen or if you have severe symptoms including breathing issues, throat closure, significant swelling, whole body hives, severe diarrhea and vomiting, lightheadedness then inject epinephrine and seek immediate medical care afterwards.  Repeat testing to peanuts, tree nuts and sesame in 1 year.   Schedule for crab challenge.   If interested we can schedule food challenge to crabs. You must be off antihistamines for 3-5 days before. Must be in good health and not ill. Plan on being in the office for 2-3 hours and must bring in the food you want to do the oral challenge for. You must call to schedule an appointment and specify it's for a food challenge.   Vaccine counseling  Recommend getting the COVID-19 vaccine.    Return in about 6 months (around 11/07/2020).  Meds ordered this encounter  Medications  . budesonide-formoterol (SYMBICORT) 80-4.5 MCG/ACT inhaler    Sig: Inhale 1 puff into the lungs in the morning and at bedtime.    Dispense:  1 each    Refill:  5  . triamcinolone ointment (KENALOG) 0.1 %     Sig: Apply 1 application topically 2 (two) times daily as needed. Do not use on the face, neck, armpits or groin area. Do not use more than 3 weeks in a row.    Dispense:  80 g    Refill:  1   Diagnostics: Spirometry:  Tracings reviewed. Her effort: Good reproducible efforts. FVC: 3.96L FEV1: 3.29L, 102% predicted FEV1/FVC ratio: 83% Interpretation: Spirometry consistent with normal pattern.  Please see scanned spirometry results for details.  Medication List:  Current Outpatient Medications  Medication Sig Dispense Refill  . albuterol (PROVENTIL HFA;VENTOLIN HFA) 108 (90 Base) MCG/ACT inhaler Inhale 1-2 puffs into the lungs every 6 (six) hours as needed for wheezing or shortness of breath. 1 Inhaler 0  . dupilumab (DUPIXENT) 300 MG/2ML prefilled syringe Inject 300 mg into the skin every 14 (fourteen) days. 4 mL 11  . hydrocortisone 2.5 % ointment Apply topically 2 (two) times daily. May use on the face up to 7 days at a time. 30 g 3  . loratadine (CLARITIN) 10 MG tablet Take 1 tablet (10 mg total) by mouth daily. 30 tablet 0  . mometasone (ELOCON) 0.1 % ointment Apply topically 2 (two) times daily. Below the neck up to 3 weeks  a time. May use for flares. 45 g 3  . budesonide-formoterol (SYMBICORT) 80-4.5 MCG/ACT inhaler Inhale 1 puff into the lungs in the morning and at bedtime. 1 each 5  . fluticasone (FLONASE) 50 MCG/ACT nasal spray Place 2 sprays into both nostrils daily. FOR NASAL CONGESTION (Patient not taking: Reported on 11/05/2019) 16 g 5  . triamcinolone ointment (KENALOG) 0.1 % Apply 1 application topically 2 (two) times daily as needed. Do not use on the face, neck, armpits or groin area. Do not use more than 3 weeks in a row. 80 g 1   No current facility-administered medications for this visit.   Allergies: Allergies  Allergen Reactions  . Peanut-Containing Drug Products Anaphylaxis    REACTION: unspecified  . Shrimp [Shellfish Allergy] Hives  . Latex Hives  .  Penicillins Hives   I reviewed her past medical history, social history, family history, and environmental history and no significant changes have been reported from her previous visit.  Review of Systems  Constitutional: Negative for appetite change, chills, fever and unexpected weight change.  HENT: Negative for congestion and rhinorrhea.   Eyes: Negative for itching.  Respiratory: Negative for cough, chest tightness, shortness of breath and wheezing.   Gastrointestinal: Negative for abdominal pain.  Skin: Positive for rash.  Allergic/Immunologic: Positive for environmental allergies and food allergies.  Neurological: Negative for headaches.   Objective: BP 122/70   Pulse 89   Temp 97.7 F (36.5 C) (Temporal)   Resp 18   Wt (!) 325 lb 6.4 oz (147.6 kg)   SpO2 98%   BMI 48.05 kg/m  Body mass index is 48.05 kg/m. Physical Exam Vitals and nursing note reviewed.  Constitutional:      Appearance: Normal appearance. She is well-developed.  HENT:     Head: Normocephalic and atraumatic.     Right Ear: External ear normal.     Left Ear: External ear normal.     Nose: Nose normal.     Mouth/Throat:     Mouth: Mucous membranes are moist.     Pharynx: Oropharynx is clear.  Eyes:     Conjunctiva/sclera: Conjunctivae normal.  Cardiovascular:     Rate and Rhythm: Normal rate and regular rhythm.     Heart sounds: Normal heart sounds. No murmur heard.  No friction rub. No gallop.   Pulmonary:     Effort: Pulmonary effort is normal.     Breath sounds: Normal breath sounds. No wheezing or rales.  Musculoskeletal:     Cervical back: Neck supple.  Skin:    General: Skin is warm and dry.     Findings: Rash present.     Comments: Skin is much improved. Some patches of hypopigmented areas on upper extremities b/l and feet b/l. Dry patches on hands b/l.  Neurological:     Mental Status: She is alert and oriented to person, place, and time.  Psychiatric:        Behavior: Behavior  normal.    Previous notes and tests were reviewed. The plan was reviewed with the patient/family, and all questions/concerned were addressed.  It was my pleasure to see [name] [name] today and participate in her care. Please feel free to contact me with any questions or concerns.  Sincerely,  Wyline Mood, DO Allergy & Immunology  [facility] office: [phone] [facility] office: [phone] [place] office: [phone]

## 2020-05-10 NOTE — Patient Instructions (Addendum)
Moderate persistent asthma  Today's spirometry was normal.  Daily controller medication(s):  ? Decrease to Symbicort 1 puff twice a day with spacer and rinse mouth afterwards on a regular basis.  During upper respiratory infections/asthma flares: start Symbicort 2 puffs twice a day for 1-2 weeks until your breathing symptoms return to baseline.   May use albuterol rescue inhaler 2 puffs every 4 to 6 hours as needed for shortness of breath, chest tightness, coughing, and wheezing. May use albuterol rescue inhaler 2 puffs 5 to 15 minutes prior to strenuous physical activities. Monitor frequency of use.   Asthma control goals:  o Full participation in all desired activities (may need albuterol before activity) o Albuterol use two times or less a week on average (not counting use with activity) o Cough interfering with sleep two times or less a month o Oral steroids no more than once a year o No hospitalizations  Other allergic rhinitis  Past skin testing showed: Positive to grass, tree, ragweed, weed, dust mites, dog, cat, mold, horse, mouse.    Continue environmental control measures.  May use over the counter antihistamines such as Zyrtec (cetirizine), Claritin (loratadine), Allegra (fexofenadine), or Xyzal (levocetirizine) daily as needed.  May use Flonase 1 spray 1-2 times a day as needed.  Other atopic dermatitis  Continue dupixent injections every 2 weeks at home.   Continue proper skin care measures.  Medications:  Only apply to affected areas that are rough and red Body:   May use triamcinolone 0.1% ointment twice a day as needed for eczema flares. Do not use on the face, neck, armpits or groin area. Do not use more than 3 weeks in a row.   For more than twice a day use the following: Aquaphor, Vaseline, Cerave, Cetaphil, Eucerin, Vanicream.  Itching:  Take Zyrtec 10mg  in the morning as needed.   Adverse food reaction  Past skin testing showed:  peanuts, tree nuts, sesame.  Continue to avoid peanuts, tree nuts, sesame and shellfish. Okay to eat shrimp as before.   For mild symptoms you can take over the counter antihistamines such as Benadryl and monitor symptoms closely. If symptoms worsen or if you have severe symptoms including breathing issues, throat closure, significant swelling, whole body hives, severe diarrhea and vomiting, lightheadedness then inject epinephrine and seek immediate medical care afterwards.  Schedule for crab challenge.   If interested we can schedule food challenge to crabs. You must be off antihistamines for 3-5 days before. Must be in good health and not ill. Plan on being in the office for 2-3 hours and must bring in the food you want to do the oral challenge for. You must call to schedule an appointment and specify it's for a food challenge.   Recommend getting the COVID-19 vaccine. Make sure it's 72 hours from your Dupixent injections.   Here's more information:   Follow up in 6 months or sooner if needed.

## 2020-05-10 NOTE — Assessment & Plan Note (Signed)
Past history - Reactions to peanuts and shellfish in the past but able to eat shrimp with no issues. 2020 skin testing showed: peanuts, tree nuts, sesame. 2020 immunocap borderline positive to shellfish.  Interim history - No accidental ingestions. Tolerates shrimp.   Continue to avoid peanuts, tree nuts, sesame and shellfish. Okay to eat shrimp as before.   For mild symptoms you can take over the counter antihistamines such as Benadryl and monitor symptoms closely. If symptoms worsen or if you have severe symptoms including breathing issues, throat closure, significant swelling, whole body hives, severe diarrhea and vomiting, lightheadedness then inject epinephrine and seek immediate medical care afterwards.  Repeat testing to peanuts, tree nuts and sesame in 1 year.   Schedule for crab challenge.   If interested we can schedule food challenge to crabs. You must be off antihistamines for 3-5 days before. Must be in good health and not ill. Plan on being in the office for 2-3 hours and must bring in the food you want to do the oral challenge for. You must call to schedule an appointment and specify it's for a food challenge.

## 2020-05-14 ENCOUNTER — Ambulatory Visit: Payer: Self-pay

## 2020-06-20 ENCOUNTER — Telehealth: Payer: Self-pay | Admitting: Emergency Medicine

## 2020-06-20 ENCOUNTER — Emergency Department (INDEPENDENT_AMBULATORY_CARE_PROVIDER_SITE_OTHER)
Admission: EM | Admit: 2020-06-20 | Discharge: 2020-06-20 | Disposition: A | Discharge status: Home / Self Care | Payer: Self-pay | Source: Home / Self Care | Attending: Family Medicine | Admitting: Family Medicine

## 2020-06-20 ENCOUNTER — Other Ambulatory Visit: Payer: Self-pay

## 2020-06-20 DIAGNOSIS — H6123 Impacted cerumen, bilateral: Secondary | ICD-10-CM

## 2020-06-20 DIAGNOSIS — H9201 Otalgia, right ear: Secondary | ICD-10-CM

## 2020-06-20 MED ORDER — DOXYCYCLINE HYCLATE 100 MG PO CAPS: ORAL_CAPSULE | ORAL | 0 refills | Status: DC

## 2020-06-20 MED ORDER — NEOMYCIN-POLYMYXIN-HC 3.5-10000-1 OT SUSP: 3.0000 [drp] | Freq: Three times a day (TID) | OTIC | 0 refills | Status: DC

## 2020-06-20 NOTE — ED Provider Notes (Signed)
[facility]    CSN: [id number] Arrival date & time: 06/20/20  1035      History   Chief Complaint Chief Complaint  Patient presents with  . Otalgia    HPI [name] [name] is a 29 y.o. female.   Patient reports that her right ear has felt full recently.  After trying to clean her right ear about 6 days ago she has developed a persistent right earache.  She denies nasal congestion, recent URI, or fever.  The history is provided by the patient.    Past Medical History:  Diagnosis Date  . Allergic rhinitis   . Allergy   . Asthma    hosp 06/2009  . Eczema   . Kidney stone    right ER 09/17/09, 707/2012    Patient Active Problem List   Diagnosis Date Noted  . Vaccine counseling 05/10/2020  . Seasonal and perennial allergic rhinoconjunctivitis 11/05/2019  . Moderate persistent asthma without complication 10/14/2018  . Adverse food reaction 10/14/2018  . Asthma 08/21/2013  . Influenza-like illness 08/21/2013  . Food allergy 09/01/2012  . 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 Routine general medical examination at a health care facility 04/03/2011  . 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 Other allergic rhinitis 07/29/2007  . 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 Other atopic dermatitis 07/29/2007    Past Surgical History:  Procedure Laterality Date  . dental extractions      OB History   No obstetric history on file.      Home Medications    Prior to Admission medications   Medication Sig Start Date End Date Taking? Authorizing Provider  albuterol (PROVENTIL HFA;VENTOLIN HFA) 108 (90 Base) MCG/ACT inhaler Inhale 1-2 puffs into the lungs every 6 (six) hours as needed for wheezing or shortness of breath. 11/04/16  Yes Liberty Handy, PA-C  budesonide-formoterol (SYMBICORT) 80-4.5 MCG/ACT inhaler Inhale 1 puff into the lungs in the morning and at bedtime. 05/10/20  Yes Ellamae Sia, DO  dupilumab (DUPIXENT) 300 MG/2ML prefilled syringe Inject 300 mg into the  skin every 14 (fourteen) days. 03/15/20  Yes Ellamae Sia, DO  fluticasone (FLONASE) 50 MCG/ACT nasal spray Place 2 sprays into both nostrils daily. FOR NASAL CONGESTION 10/15/18  Yes Ellamae Sia, DO  hydrocortisone 2.5 % ointment Apply topically 2 (two) times daily. May use on the face up to 7 days at a time. 11/10/19  Yes Ellamae Sia, DO  loratadine (CLARITIN) 10 MG tablet Take 1 tablet (10 mg total) by mouth daily. 11/04/16  Yes Sharen Heck J, PA-C  mometasone (ELOCON) 0.1 % ointment Apply topically 2 (two) times daily. Below the neck up to 3 weeks a time. May use for flares. 11/10/19  Yes Ellamae Sia, DO  doxycycline (VIBRAMYCIN) 100 MG capsule Take one cap PO Q12hr with food. 06/20/20   Lattie Haw, MD  neomycin-polymyxin-hydrocortisone (CORTISPORIN) 3.5-10000-1 OTIC suspension Place 3 drops into both ears 3 (three) times daily. 06/20/20   Lattie Haw, MD  triamcinolone ointment (KENALOG) 0.1 % Apply 1 application topically 2 (two) times daily as needed. Do not use on the face, neck, armpits or groin area. Do not use more than 3 weeks in a row. 05/10/20   Ellamae Sia, DO    Family History Family History  Problem Relation Age of Onset  . Asthma Mother   . Eczema Mother   . Asthma Other        uncle  . Eczema Other  uncle    Social History Social History   Tobacco Use  . Smoking status: Current Some Day Smoker    Types: Cigarettes    Last attempt to quit: 2019    Years since quitting: 2.8  . Smokeless tobacco: Never Used  Substance Use Topics  . Alcohol use: Yes    Comment: wine, liquor,beer on weekends  . Drug use: No     Allergies   Peanut-containing drug products, Shrimp [shellfish allergy], Latex, and Penicillins   Review of Systems Review of Systems  No sore throat No cough No pleuritic pain No wheezing No nasal congestion No post-nasal drainage No sinus pain/pressure No itchy/red eyes + right earache No hemoptysis No SOB No fever/chills No  nausea No vomiting No abdominal pain No diarrhea No urinary symptoms No skin rash No fatigue No myalgias No headache    Physical Exam Triage Vital Signs ED Triage Vitals  Enc Vitals Group     BP 06/20/20 1108 139/90     Pulse Rate 06/20/20 1108 76     Resp --      Temp 06/20/20 1108 98.3 F (36.8 C)     Temp Source 06/20/20 1108 Oral     SpO2 06/20/20 1108 95 %     Weight 06/20/20 1106 (!) 315 lb (142.9 kg)     Height 06/20/20 1106 5\' 9"  (1.753 m)     Head Circumference --      Peak Flow --      Pain Score 06/20/20 1106 10     Pain Loc --      Pain Edu? --      Excl. in GC? --    No data found.  Updated Vital Signs BP 139/90 (BP Location: Right Arm)   Pulse 76   Temp 98.3 F (36.8 C) (Oral)   Ht 5\' 9"  (1.753 m)   Wt (!) 142.9 kg   LMP 06/11/2020   SpO2 95%   BMI 46.52 kg/m   Visual Acuity Right Eye Distance:   Left Eye Distance:   Bilateral Distance:    Right Eye Near:   Left Eye Near:    Bilateral Near:     Physical Exam Vitals and nursing note reviewed.  Constitutional:      General: She is not in acute distress.    Appearance: She is obese.  HENT:     Head: Normocephalic.     Right Ear: External ear normal. There is impacted cerumen.     Left Ear: External ear normal. There is impacted cerumen.     Ears:     Comments: Both ears have persistent cerumen impaction after attempts to lavage.  Right distal canal is mildly erythematous and has pain with insertion of speculum.    Nose: Nose normal.     Mouth/Throat:     Pharynx: Oropharynx is clear.  Eyes:     Conjunctiva/sclera: Conjunctivae normal.     Pupils: Pupils are equal, round, and reactive to light.  Cardiovascular:     Rate and Rhythm: Normal rate.  Pulmonary:     Effort: Pulmonary effort is normal.  Musculoskeletal:     Cervical back: Neck supple.  Lymphadenopathy:     Cervical: No cervical adenopathy.  Skin:    General: Skin is warm and dry.     Findings: No rash.    Neurological:     Mental Status: She is alert and oriented to person, place, and time.      UC Treatments /  Results  Labs (all labs ordered are listed, but only abnormal results are displayed) Labs Reviewed - No data to display  EKG   Radiology No results found.  Procedures Procedures (including critical care time)  Medications Ordered in UC Medications - No data to display  Initial Impression / Assessment and Plan / UC Course  I have reviewed the triage vital signs and the nursing notes.  Pertinent labs & imaging results that were available during my care of the patient were reviewed by me and considered in my medical decision making (see chart for details).    Begin empiric doxycycline, and Cortisporin otic suspension. Return in about 8 days for cerumen removal.   Final Clinical Impressions(s) / UC Diagnoses   Final diagnoses:  Bilateral impacted cerumen  Otalgia of right ear     Discharge Instructions     May take Ibuprofen 200mg , 4 tabs every 8 hours with food as needed for pain.  If symptoms become significantly worse during the night or over the weekend, proceed to the local emergency room.     ED Prescriptions    Medication Sig Dispense Auth. Provider   doxycycline (VIBRAMYCIN) 100 MG capsule Take one cap PO Q12hr with food. 20 capsule , MD   neomycin-polymyxin-hydrocortisone (CORTISPORIN) 3.5-10000-1 OTIC suspension Place 3 drops into both ears 3 (three) times daily. 7.5 mL 04-23-2003, MD        Lattie Haw, MD 06/21/20 1256

## 2020-06-20 NOTE — Discharge Instructions (Addendum)
May take Ibuprofen 200mg, 4 tabs every 8 hours with food as needed for pain.  If symptoms become significantly worse during the night or over the weekend, proceed to the local emergency room.  

## 2020-06-20 NOTE — ED Triage Notes (Signed)
Pt states that she has some right ear pain. x6 days

## 2020-06-20 NOTE — Telephone Encounter (Signed)
Change pharmacy per patient.

## 2020-09-01 ENCOUNTER — Telehealth: Payer: Self-pay | Admitting: Allergy

## 2020-09-01 NOTE — Telephone Encounter (Signed)
Called patient and left a voicemail asking for patient to return call to ask some questions regarding her symptoms.

## 2020-09-01 NOTE — Telephone Encounter (Signed)
Called patient and advised. Patient verbalized understanding.  

## 2020-09-01 NOTE — Telephone Encounter (Signed)
Patient called to say she tested positive for Covid. She is requesting a steroid. Audiological scientist on [place].

## 2020-09-01 NOTE — Telephone Encounter (Signed)
Patient called and stated that she tested positive yesterday for COVID. She is experiencing cough, chills, body aches, fever, headache, and fatigue. She stated that she is not having any asthma flare symptoms. I did advise to go ahead and increase her Symbicort to 2 puffs 2 times daily during this time and to use her Albuterol 2 puffs every 4-6 hours as needed. Patient was inquiring about a steroid, I advised that you may not find a steroid to be necessary but that I would send a message. Patient verbalized understanding.

## 2020-09-01 NOTE — Telephone Encounter (Signed)
Steroids are not recommended to be used routinely with covid-19 infection in an outpatient setting.  I don't think she needs steroids as her asthma symptoms are not flaring.   Agree with stepping up therapy as below.   Recommend checking pulse ox levels throughout the day. If they are consistently below 90% then she needs to go to ER for further evaluation.

## 2020-11-04 ENCOUNTER — Encounter (HOSPITAL_COMMUNITY): Payer: Self-pay

## 2020-11-04 ENCOUNTER — Emergency Department (HOSPITAL_COMMUNITY)
Admission: EM | Admit: 2020-11-04 | Discharge: 2020-11-04 | Disposition: A | Discharge status: Home / Self Care | Payer: Self-pay | Attending: Emergency Medicine | Admitting: Emergency Medicine

## 2020-11-04 DIAGNOSIS — J4540 Moderate persistent asthma, uncomplicated: Secondary | ICD-10-CM | POA: Insufficient documentation

## 2020-11-04 DIAGNOSIS — L299 Pruritus, unspecified: Secondary | ICD-10-CM | POA: Insufficient documentation

## 2020-11-04 DIAGNOSIS — Z7951 Long term (current) use of inhaled steroids: Secondary | ICD-10-CM | POA: Insufficient documentation

## 2020-11-04 DIAGNOSIS — F17210 Nicotine dependence, cigarettes, uncomplicated: Secondary | ICD-10-CM | POA: Insufficient documentation

## 2020-11-04 DIAGNOSIS — R609 Edema, unspecified: Secondary | ICD-10-CM | POA: Insufficient documentation

## 2020-11-04 DIAGNOSIS — T782XXA Anaphylactic shock, unspecified, initial encounter: Secondary | ICD-10-CM | POA: Insufficient documentation

## 2020-11-04 DIAGNOSIS — X58XXXA Exposure to other specified factors, initial encounter: Secondary | ICD-10-CM | POA: Insufficient documentation

## 2020-11-04 DIAGNOSIS — R21 Rash and other nonspecific skin eruption: Secondary | ICD-10-CM | POA: Insufficient documentation

## 2020-11-04 DIAGNOSIS — R0600 Dyspnea, unspecified: Secondary | ICD-10-CM | POA: Insufficient documentation

## 2020-11-04 DIAGNOSIS — Z91010 Allergy to peanuts: Secondary | ICD-10-CM | POA: Insufficient documentation

## 2020-11-04 DIAGNOSIS — Z91040 Latex allergy status: Secondary | ICD-10-CM | POA: Insufficient documentation

## 2020-11-04 DIAGNOSIS — L509 Urticaria, unspecified: Secondary | ICD-10-CM | POA: Insufficient documentation

## 2020-11-04 MED ORDER — LACTATED RINGERS IV BOLUS
1000.0000 mL | Freq: Once | INTRAVENOUS | Status: AC
  Administered 2020-11-04: 1000 mL via INTRAVENOUS

## 2020-11-04 MED ORDER — PREDNISONE 10 MG PO TABS: 40.0000 mg | ORAL_TABLET | Freq: Every day | ORAL | 0 refills | Status: AC

## 2020-11-04 MED ORDER — PREDNISONE 10 MG PO TABS: 40.0000 mg | ORAL_TABLET | Freq: Every day | ORAL | 0 refills | Status: DC

## 2020-11-04 MED ORDER — EPINEPHRINE 0.3 MG/0.3ML IJ SOAJ: 0.3000 mg | INTRAMUSCULAR | 0 refills | Status: DC | PRN

## 2020-11-04 NOTE — Discharge Instructions (Addendum)
Make sure you have your EpiPen with you at all times.  Use it whenever you have concerns for severe allergic reaction and then call for help.  Over the next 3 days take the steroids once daily as prescribed.  And take Benadryl 25 mg every 6 hours for the next 48 hours.

## 2020-11-04 NOTE — ED Triage Notes (Signed)
Post lunch break difficulty breathing, hives, face is "puffy". History of asthma. In route Atrovent, albuterol, 125 solumedrol, 50 benadryl.

## 2020-11-04 NOTE — ED Provider Notes (Signed)
[facility]-EMERGENCY DEPT Provider Note   CSN: [id number] Arrival date & time: 11/04/20  1324     History Chief Complaint  Patient presents with  . Allergic Reaction    [name] [name] is a 30 y.o. female.   Allergic Reaction Presenting symptoms: difficulty breathing, itching, rash and swelling   Severity:  Severe Duration:  1 hour Context: food (shrimp)   Relieved by:  Antihistamines, steroids and epinephrine Worsened by:  Nothing Ineffective treatments:  None tried      Past Medical History:  Diagnosis Date  . Allergic rhinitis   . Allergy   . Asthma    hosp 06/2009  . Eczema   . Kidney stone    right ER 09/17/09, 707/2012    Patient Active Problem List   Diagnosis Date Noted  . Vaccine counseling 05/10/2020  . Seasonal and perennial allergic rhinoconjunctivitis 11/05/2019  . Moderate persistent asthma without complication 10/14/2018  . Adverse food reaction 10/14/2018  . Asthma 08/21/2013  . Influenza-like illness 08/21/2013  . Food allergy 09/01/2012  . 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 Routine general medical examination at a health care facility 04/03/2011  . 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 Other allergic rhinitis 07/29/2007  . 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 Other atopic dermatitis 07/29/2007    Past Surgical History:  Procedure Laterality Date  . dental extractions       OB History   No obstetric history on file.     Family History  Problem Relation Age of Onset  . Asthma Mother   . Eczema Mother   . Asthma Other        uncle  . Eczema Other        uncle    Social History   Tobacco Use  . Smoking status: Current Some Day Smoker    Types: Cigarettes    Last attempt to quit: 2019    Years since quitting: 3.1  . Smokeless tobacco: Never Used  Substance Use Topics  . Alcohol use: Yes    Comment: wine, liquor,beer on weekends  . Drug use: No    Home Medications Prior to Admission medications    Medication Sig Start Date End Date Taking? Authorizing Provider  predniSONE (DELTASONE) 10 MG tablet Take 4 tablets (40 mg total) by mouth daily with breakfast for 3 days. 11/04/20 11/07/20 Yes Sabino Donovan, MD  albuterol (PROVENTIL HFA;VENTOLIN HFA) 108 (90 Base) MCG/ACT inhaler Inhale 1-2 puffs into the lungs every 6 (six) hours as needed for wheezing or shortness of breath. 11/04/16   Liberty Handy, PA-C  budesonide-formoterol (SYMBICORT) 80-4.5 MCG/ACT inhaler Inhale 1 puff into the lungs in the morning and at bedtime. 05/10/20   Ellamae Sia, DO  doxycycline (VIBRAMYCIN) 100 MG capsule Take one cap PO Q12hr with food. 06/20/20   Lattie Haw, MD  dupilumab (DUPIXENT) 300 MG/2ML prefilled syringe Inject 300 mg into the skin every 14 (fourteen) days. 03/15/20   Ellamae Sia, DO  fluticasone (FLONASE) 50 MCG/ACT nasal spray Place 2 sprays into both nostrils daily. FOR NASAL CONGESTION 10/15/18   Ellamae Sia, DO  hydrocortisone 2.5 % ointment Apply topically 2 (two) times daily. May use on the face up to 7 days at a time. 11/10/19   Ellamae Sia, DO  loratadine (CLARITIN) 10 MG tablet Take 1 tablet (10 mg total) by mouth daily. 11/04/16   Liberty Handy, PA-C  mometasone (ELOCON) 0.1 % ointment Apply topically 2 (two) times daily.  Below the neck up to 3 weeks a time. May use for flares. 11/10/19   Ellamae Sia, DO  neomycin-polymyxin-hydrocortisone (CORTISPORIN) 3.5-10000-1 OTIC suspension Place 3 drops into both ears 3 (three) times daily. 06/20/20   Lattie Haw, MD  triamcinolone ointment (KENALOG) 0.1 % Apply 1 application topically 2 (two) times daily as needed. Do not use on the face, neck, armpits or groin area. Do not use more than 3 weeks in a row. 05/10/20   Ellamae Sia, DO    Allergies    Peanut-containing drug products, Shrimp [shellfish allergy], Latex, and Penicillins  Review of Systems   Review of Systems  Constitutional: Negative for chills and fever.  HENT: Positive for  facial swelling. Negative for congestion and rhinorrhea.   Respiratory: Positive for chest tightness and shortness of breath. Negative for cough.   Cardiovascular: Negative for chest pain and palpitations.  Gastrointestinal: Negative for diarrhea, nausea and vomiting.  Genitourinary: Negative for difficulty urinating and dysuria.  Musculoskeletal: Negative for arthralgias and back pain.  Skin: Positive for itching and rash. Negative for wound.  Neurological: Negative for light-headedness and headaches.    Physical Exam Updated Vital Signs BP 140/87 (BP Location: Right Arm)   Pulse 99   Temp 98.6 F (37 C)   Resp 18   SpO2 97%   Physical Exam Vitals and nursing note reviewed. Exam conducted with a chaperone present.  Constitutional:      General: She is not in acute distress.    Appearance: Normal appearance.  HENT:     Head: Normocephalic and atraumatic.     Nose: No rhinorrhea.     Mouth/Throat:     Mouth: Mucous membranes are moist.     Pharynx: Oropharynx is clear. No oropharyngeal exudate.  Eyes:     General:        Right eye: No discharge.        Left eye: No discharge.     Conjunctiva/sclera: Conjunctivae normal.  Cardiovascular:     Rate and Rhythm: Normal rate and regular rhythm.  Pulmonary:     Effort: Pulmonary effort is normal. No respiratory distress.     Breath sounds: No stridor. Wheezing present.  Abdominal:     General: Abdomen is flat. There is no distension.     Palpations: Abdomen is soft.  Musculoskeletal:        General: No tenderness or signs of injury.  Skin:    General: Skin is warm and dry.     Findings: Rash (diffuse urticaria) present.  Neurological:     General: No focal deficit present.     Mental Status: She is alert. Mental status is at baseline.     Motor: No weakness.  Psychiatric:        Mood and Affect: Mood normal.        Behavior: Behavior normal.     ED Results / Procedures / Treatments   Labs (all labs ordered are  listed, but only abnormal results are displayed) Labs Reviewed - No data to display  EKG None  Radiology No results found.  Procedures .Critical Care Performed by: Sabino Donovan, MD Authorized by: Sabino Donovan, MD   Critical care provider statement:    Critical care time (minutes):  45   Critical care was necessary to treat or prevent imminent or life-threatening deterioration of the following conditions:  Circulatory failure (anaphylaxis)   Critical care was time spent personally by me on the following activities:  Discussions with consultants, evaluation of patient's response to treatment, examination of patient, ordering and performing treatments and interventions, pulse oximetry, re-evaluation of patient's condition, obtaining history from patient or surrogate and review of old charts     Medications Ordered in ED Medications  lactated ringers bolus 1,000 mL (1,000 mLs Intravenous New Bag/Given 11/04/20 1347)    ED Course  I have reviewed the triage vital signs and the nursing notes.  Pertinent labs & imaging results that were available during my care of the patient were reviewed by me and considered in my medical decision making (see chart for details).    MDM Rules/Calculators/A&P                          Patient treated with epinephrine and Solu-Medrol and Benadryl in route to our facility for anaphylactic reaction likely to seafood.  Shrimp.  History of the same.  Improving with symptoms per patient.  Still has wheezing and urticarial rash, patient states facial swelling is markedly improved and rash is improved as well as breathing symptoms.  She will get IV fluids and she will be observed here for further evaluation and need of additional medication.  I checked on this patient multiple times, she remained stable on room air and vital signs are much improved.  Her symptoms are almost entirely resolved.  Minor rash still remaining.  She will need to be observed in our  emergency department for a total of 4 hours.  Discharge time at 5:30 PM.  She has all the epinephrine pen she needs at home but she does not always carry them with her.  She is instructed to do so.  She will also need a prescription for steroids over the next 3 days.  Pt care was handed off to on coming provider at 1500.  Complete history and physical and current plan have been communicated.  Please refer to their note for the remainder of ED care and ultimate disposition.  Pt seen in conjunction with Dr. Stevie Kern   CRITICAL CARE Performed by: Sabino Donovan   Total critical care time: 45 minutes  Critical care time was exclusive of separately billable procedures and treating other patients.  Critical care was necessary to treat or prevent imminent or life-threatening deterioration.  Critical care was time spent personally by me on the following activities: development of treatment plan with patient and/or surrogate as well as nursing, discussions with consultants, evaluation of patient's response to treatment, examination of patient, obtaining history from patient or surrogate, ordering and performing treatments and interventions, ordering and review of laboratory studies, ordering and review of radiographic studies, pulse oximetry and re-evaluation of patient's condition.  Final Clinical Impression(s) / ED Diagnoses Final diagnoses:  Anaphylaxis, initial encounter    Rx / DC Orders ED Discharge Orders         Ordered    predniSONE (DELTASONE) 10 MG tablet  Daily with breakfast        11/04/20 1511           Sabino Donovan, MD 11/04/20 1511

## 2020-11-04 NOTE — ED Provider Notes (Signed)
Signout note  30 year old lady presents to ER with concern for possible anaphylaxis.  Symptoms greatly improved, received epi x1, anticipate discharge if stable/improving at 5:30 PM.  3:30 PM Received signout from Dr. Myrtis Ser  5:30 PM Patient has no ongoing symptoms, vital stable, DC home with girlfriend    Milagros Loll, MD 11/04/20 [phone]

## 2020-11-08 ENCOUNTER — Encounter: Payer: Self-pay | Admitting: Allergy

## 2020-11-08 ENCOUNTER — Other Ambulatory Visit: Payer: Self-pay

## 2020-11-08 ENCOUNTER — Ambulatory Visit (INDEPENDENT_AMBULATORY_CARE_PROVIDER_SITE_OTHER): Payer: BC Managed Care – PPO | Admitting: Allergy

## 2020-11-08 VITALS — BP 122/88 | HR 68 | Temp 97.1°F | Resp 18

## 2020-11-08 DIAGNOSIS — J4540 Moderate persistent asthma, uncomplicated: Secondary | ICD-10-CM | POA: Diagnosis not present

## 2020-11-08 DIAGNOSIS — H1010 Acute atopic conjunctivitis, unspecified eye: Secondary | ICD-10-CM

## 2020-11-08 DIAGNOSIS — T7800XD Anaphylactic reaction due to unspecified food, subsequent encounter: Secondary | ICD-10-CM

## 2020-11-08 DIAGNOSIS — J302 Other seasonal allergic rhinitis: Secondary | ICD-10-CM

## 2020-11-08 DIAGNOSIS — L2089 Other atopic dermatitis: Secondary | ICD-10-CM | POA: Diagnosis not present

## 2020-11-08 DIAGNOSIS — H1013 Acute atopic conjunctivitis, bilateral: Secondary | ICD-10-CM

## 2020-11-08 DIAGNOSIS — J3089 Other allergic rhinitis: Secondary | ICD-10-CM

## 2020-11-08 MED ORDER — EUCRISA 2 % EX OINT: TOPICAL_OINTMENT | CUTANEOUS | 5 refills | Status: DC

## 2020-11-08 NOTE — Assessment & Plan Note (Signed)
Past history - Perennial rhinoconjunctivitis symptoms for the past 10 years but worse the last year.  Symptoms seem to flare during the spring.  Tried Flonase and Claritin with some benefit.  Used to be on allergy injections possibly for a year with some benefit as a child.  No previous ENT evaluation. 2020 skin testing showed: Positive to grass, tree, ragweed, weed, dust mites, dog, cat, mold, horse, mouse.   Interim history - well controlled.  Continue environmental control measures.  May use over the counter antihistamines such as Zyrtec (cetirizine), Claritin (loratadine), Allegra (fexofenadine), or Xyzal (levocetirizine) daily as needed.  May use Flonase 1 spray 1-2 times a day as needed.

## 2020-11-08 NOTE — Assessment & Plan Note (Addendum)
Past history - Eczema since a young child.  Currently using clobetasol almost daily on the face with good benefit.  Has tried Elidel and hydrocortisone in the past with no improvement in symptoms.  Patient has patches of hyperpigmented with leathery/hyperkeratotic skin changes on popliteal fossa b/l and hands.  Interim history - much improved with Dupixent but hands flaring due to work.  Continue Dupixent injections every 2 weeks at home.   Try to keep hands dry at work.  Wear a cotton glove under the rubber glove - get latex free.  Use the following for the hands:   May use Eucrisa (crisaborole) 2% ointment twice a day on mild eczema flares on the face and body. This is a non-steroid ointment. Samples given. Medications:  Only apply to affected areas that are "rough and red" Body:   May use triamcinolone 0.1% ointment twice a day as needed for eczema flares. Do not use on the face, neck, armpits or groin area. Do not use more than 3 weeks in a row.   For more than twice a day use the following: Aquaphor, Vaseline, Cerave, Cetaphil, Eucerin, Vanicream.  Itching:  Take Zyrtec 10mg  in the morning as needed.

## 2020-11-08 NOTE — Assessment & Plan Note (Signed)
Past history - Diagnosed with asthma over 20 years ago.  Using Symbicort 160 1 puff once or twice a day as needed and albuterol as needed with good benefit.  Tried Advair and Singulair in the past.  2020 spirometry showed consistent with possible restrictive disease with 11% improvement in FEV1 post bronchodilator treatment.   Interim history - not using any daily inhalers.  Today's spirometry was normal. ACT score 25.   The Dupixent for the atopic dermatitis is probably also helping her asthma symptoms.   Daily controller medication(s):  ? None. . During upper respiratory infections/asthma flares: start Symbicort 2 puffs twice a day for 1-2 weeks until your breathing symptoms return to baseline.  . May use albuterol rescue inhaler 2 puffs every 4 to 6 hours as needed for shortness of breath, chest tightness, coughing, and wheezing. May use albuterol rescue inhaler 2 puffs 5 to 15 minutes prior to strenuous physical activities. Monitor frequency of use.   Repeat spirometry at next visit.

## 2020-11-08 NOTE — Assessment & Plan Note (Signed)
Past history - Reactions to peanuts and shellfish in the past but able to eat shrimp with no issues. 2020 skin testing showed: peanuts, tree nuts, sesame. 2020 immunocap borderline positive to shellfish.  Interim history - accidental exposure to peanut containing eggroll requiring ER visit with IM epi.  Stressed importance of being careful about eating out and having epinephrine device on hand.   Continue to avoid peanuts, tree nuts, sesame and shellfish. Okay to eat shrimp as before.   For mild symptoms you can take over the counter antihistamines such as Benadryl and monitor symptoms closely. If symptoms worsen or if you have severe symptoms including breathing issues, throat closure, significant swelling, whole body hives, severe diarrhea and vomiting, lightheadedness then inject epinephrine and seek immediate medical care afterwards.  Schedule for crab challenge.   If interested we can schedule food challenge to crabs. You must be off antihistamines for 3-5 days before. Must be in good health and not ill. Plan on being in the office for 2-3 hours and must bring in the food you want to do the oral challenge for. You must call to schedule an appointment and specify it's for a food challenge.

## 2020-11-08 NOTE — Progress Notes (Signed)
Follow Up Note  RE: [name] [name] MRN: [id number] DOB: 1991/05/13 Date of Office Visit: 11/08/2020  Referring provider: No ref. provider found Primary care provider: Patient, No Pcp Per  Chief Complaint: Allergic Reaction and Follow-up  History of Present Illness: I had the pleasure of seeing [name] [name] for a follow up visit at the [facility] on 11/08/2020. She is a 30 y.o. female, who is being followed for asthma, allergic rhinoconjunctivitis, atopic dermatitis and adverse food reaction. Her previous allergy office visit was on 05/10/2020 with Dr. Selena Batten. Today is a regular follow up visit.  Moderate persistent asthma ACT score 25.  Patient only using Symbicort about once per week and not daily.  No albuterol use.   Only had asthma flare during the allergic reaction below.  Otherwise denies any SOB, coughing, wheezing, chest tightness, nocturnal awakenings.   Seasonal and perennial allergic rhinoconjunctivitis Using zyrtec 10mg  daily with good benefit. Not using Flonase anymore.   Other atopic dermatitis Hands are dry. Currently on Dupixent injections every 2 weeks with good benefit. No issues. Wears latex gloves at work but her hands get wet at work a lot.  Adverse food reaction Patient had some leftovers for eggrolls which contained peanut butter. Patient developed swelling, rash, asthma attack. She went to the ER - treated with epinephrine, solumedrol, benadryl and breathing treatment.  Avoiding peanuts, tree nuts, sesame and shellfish, okay with shrimp. No other reactions.   11/04/2020 ER visit: "Patient treated with epinephrine and Solu-Medrol and Benadryl in route to our facility for anaphylactic reaction likely to seafood.  Shrimp.  History of the same.  Improving with symptoms per patient.  Still has wheezing and urticarial rash, patient states facial swelling is markedly improved and rash is improved as well as breathing symptoms.  She  will get IV fluids and she will be observed here for further evaluation and need of additional medication.  I checked on this patient multiple times, she remained stable on room air and vital signs are much improved.  Her symptoms are almost entirely resolved.  Minor rash still remaining.  She will need to be observed in our emergency department for a total of 4 hours.  Discharge time at 5:30 PM.  She has all the epinephrine pen she needs at home but she does not always carry them with her.  She is instructed to do so.  She will also need a prescription for steroids over the next 3 days.  Pt care was handed off to on coming provider at 1500.  Complete history and physical and current plan have been communicated.  Please refer to their note for the remainder of ED care and ultimate disposition."  Assessment and Plan: [name] [name] is a 30 y.o. female with: Moderate persistent asthma without complication Past history - Diagnosed with asthma over 20 years ago.  Using Symbicort 160 1 puff once or twice a day as needed and albuterol as needed with good benefit.  Tried Advair and Singulair in the past.  2020 spirometry showed consistent with possible restrictive disease with 11% improvement in FEV1 post bronchodilator treatment.   Interim history - not using any daily inhalers.  Today's spirometry was normal. ACT score 25.   The Dupixent for the atopic dermatitis is probably also helping her asthma symptoms.   Daily controller medication(s):  ? None. . During upper respiratory infections/asthma flares: start Symbicort 2021 2 puffs twice a day for 1-2 weeks until your breathing symptoms return to  baseline.  . May use albuterol rescue inhaler 2 puffs every 4 to 6 hours as needed for shortness of breath, chest tightness, coughing, and wheezing. May use albuterol rescue inhaler 2 puffs 5 to 15 minutes prior to strenuous physical activities. Monitor frequency of use.   Repeat spirometry at next visit.    Allergy with anaphylaxis due to food, subsequent encounter Past history - Reactions to peanuts and shellfish in the past but able to eat shrimp with no issues. 2020 skin testing showed: peanuts, tree nuts, sesame. 2020 immunocap borderline positive to shellfish.  Interim history - accidental exposure to peanut containing eggroll requiring ER visit with IM epi.  Stressed importance of being careful about eating out and having epinephrine device on hand.   Continue to avoid peanuts, tree nuts, sesame and shellfish. Okay to eat shrimp as before.   For mild symptoms you can take over the counter antihistamines such as Benadryl and monitor symptoms closely. If symptoms worsen or if you have severe symptoms including breathing issues, throat closure, significant swelling, whole body hives, severe diarrhea and vomiting, lightheadedness then inject epinephrine and seek immediate medical care afterwards.  Schedule for crab challenge.   If interested we can schedule food challenge to crabs. You must be off antihistamines for 3-5 days before. Must be in good health and not ill. Plan on being in the office for 2-3 hours and must bring in the food you want to do the oral challenge for. You must call to schedule an appointment and specify it's for a food challenge.   Seasonal and perennial allergic rhinoconjunctivitis Past history - Perennial rhinoconjunctivitis symptoms for the past 10 years but worse the last year.  Symptoms seem to flare during the spring.  Tried Flonase and Claritin with some benefit.  Used to be on allergy injections possibly for a year with some benefit as a child.  No previous ENT evaluation. 2020 skin testing showed: Positive to grass, tree, ragweed, weed, dust mites, dog, cat, mold, horse, mouse.   Interim history - well controlled.  Continue environmental control measures.  May use over the counter antihistamines such as Zyrtec (cetirizine), Claritin (loratadine), Allegra  (fexofenadine), or Xyzal (levocetirizine) daily as needed.  May use Flonase 1 spray 1-2 times a day as needed.  Other atopic dermatitis Past history - Eczema since a young child.  Currently using clobetasol almost daily on the face with good benefit.  Has tried Elidel and hydrocortisone in the past with no improvement in symptoms.  Patient has patches of hyperpigmented with leathery/hyperkeratotic skin changes on popliteal fossa b/l and hands.  Interim history - much improved with Dupixent but hands flaring due to work.  Continue Dupixent injections every 2 weeks at home.   Try to keep hands dry at work.  Wear a cotton glove under the rubber glove - get latex free.  Use the following for the hands:   May use Eucrisa (crisaborole) 2% ointment twice a day on mild eczema flares on the face and body. This is a non-steroid ointment. Samples given. Medications:  Only apply to affected areas that are "rough and red" Body:   May use triamcinolone 0.1% ointment twice a day as needed for eczema flares. Do not use on the face, neck, armpits or groin area. Do not use more than 3 weeks in a row.   For more than twice a day use the following: Aquaphor, Vaseline, Cerave, Cetaphil, Eucerin, Vanicream.  Itching:  Take Zyrtec 10mg  in the morning as  needed.   Return in about 6 months (around 05/11/2021).  Meds ordered this encounter  Medications  . Crisaborole (EUCRISA) 2 % OINT    Sig: 1 application 2 times daily on hands.    Dispense:  100 g    Refill:  5   Lab Orders  No laboratory test(s) ordered today    Diagnostics: Spirometry:  Tracings reviewed. Her effort: Good reproducible efforts. FVC: 3.66L FEV1: 3.02L, 95% predicted FEV1/FVC ratio: 83% Interpretation: Spirometry consistent with normal pattern.  Please see scanned spirometry results for details.  Medication List:  Current Outpatient Medications  Medication Sig Dispense Refill  . albuterol (PROVENTIL HFA;VENTOLIN HFA) 108  (90 Base) MCG/ACT inhaler Inhale 1-2 puffs into the lungs every 6 (six) hours as needed for wheezing or shortness of breath. 1 Inhaler 0  . budesonide-formoterol (SYMBICORT) 80-4.5 MCG/ACT inhaler Inhale 1 puff into the lungs in the morning and at bedtime. 1 each 5  . Crisaborole (EUCRISA) 2 % OINT 1 application 2 times daily on hands. 100 g 5  . dupilumab (DUPIXENT) 300 MG/2ML prefilled syringe Inject 300 mg into the skin every 14 (fourteen) days. 4 mL 11  . EPINEPHrine 0.3 mg/0.3 mL IJ SOAJ injection Inject 0.3 mg into the muscle as needed for anaphylaxis. 1 each 0  . hydrocortisone 2.5 % ointment Apply topically 2 (two) times daily. May use on the face up to 7 days at a time. 30 g 3  . loratadine (CLARITIN) 10 MG tablet Take 1 tablet (10 mg total) by mouth daily. 30 tablet 0  . mometasone (ELOCON) 0.1 % ointment Apply topically 2 (two) times daily. Below the neck up to 3 weeks a time. May use for flares. 45 g 3  . triamcinolone ointment (KENALOG) 0.1 % Apply 1 application topically 2 (two) times daily as needed. Do not use on the face, neck, armpits or groin area. Do not use more than 3 weeks in a row. 80 g 1  . fluticasone (FLONASE) 50 MCG/ACT nasal spray Place 2 sprays into both nostrils daily. FOR NASAL CONGESTION (Patient not taking: Reported on 11/08/2020) 16 g 5   No current facility-administered medications for this visit.   Allergies: Allergies  Allergen Reactions  . Peanut-Containing Drug Products Anaphylaxis    REACTION: unspecified  . Shrimp [Shellfish Allergy] Hives  . Latex Hives  . Penicillins Hives   I reviewed her past medical history, social history, family history, and environmental history and no significant changes have been reported from her previous visit.  Review of Systems  Constitutional: Negative for appetite change, chills, fever and unexpected weight change.  HENT: Negative for congestion and rhinorrhea.   Eyes: Negative for itching.  Respiratory: Negative  for cough, chest tightness, shortness of breath and wheezing.   Gastrointestinal: Negative for abdominal pain.  Skin: Positive for rash.  Allergic/Immunologic: Positive for environmental allergies and food allergies.  Neurological: Negative for headaches.   Objective: BP 122/88 (BP Location: Left Arm, Patient Position: Sitting, Cuff Size: Large)   Pulse 68   Temp (!) 97.1 F (36.2 C) (Temporal)   Resp 18   SpO2 99%  There is no height or weight on file to calculate BMI. Physical Exam Vitals and nursing note reviewed.  Constitutional:      Appearance: Normal appearance. She is well-developed.  HENT:     Head: Normocephalic and atraumatic.     Right Ear: Tympanic membrane and external ear normal.     Left Ear: Tympanic membrane and external ear  normal.     Nose: Nose normal.     Mouth/Throat:     Mouth: Mucous membranes are moist.     Pharynx: Oropharynx is clear.  Eyes:     Conjunctiva/sclera: Conjunctivae normal.  Cardiovascular:     Rate and Rhythm: Normal rate and regular rhythm.     Heart sounds: Normal heart sounds. No murmur heard. No friction rub. No gallop.   Pulmonary:     Effort: Pulmonary effort is normal.     Breath sounds: Normal breath sounds. No wheezing or rales.  Musculoskeletal:     Cervical back: Neck supple.  Skin:    General: Skin is warm and dry.     Findings: Rash present.     Comments: Dry patches on the hands b/l with hyperkeratotic and hypopigmented changes.   Neurological:     Mental Status: She is alert and oriented to person, place, and time.  Psychiatric:        Behavior: Behavior normal.    Previous notes and tests were reviewed. The plan was reviewed with the patient/family, and all questions/concerned were addressed.  It was my pleasure to see [name] [name] today and participate in her care. Please feel free to contact me with any questions or concerns.  Sincerely,  Wyline Mood, DO Allergy & Immunology  [facility] office: [phone] [facility] [place] office: [phone]

## 2020-11-08 NOTE — Patient Instructions (Addendum)
Moderate persistent asthma  Today's spirometry was normal.  Daily controller medication(s):  ? None. . During upper respiratory infections/asthma flares: start Symbicort 2 puffs twice a day for 1-2 weeks until your breathing symptoms return to baseline.  . May use albuterol rescue inhaler 2 puffs every 4 to 6 hours as needed for shortness of breath, chest tightness, coughing, and wheezing. May use albuterol rescue inhaler 2 puffs 5 to 15 minutes prior to strenuous physical activities. Monitor frequency of use.  . Asthma control goals:  o Full participation in all desired activities (may need albuterol before activity) o Albuterol use two times or less a week on average (not counting use with activity) o Cough interfering with sleep two times or less a month o Oral steroids no more than once a year o No hospitalizations  Other allergic rhinitis  Past skin testing showed: Positive to grass, tree, ragweed, weed, dust mites, dog, cat, mold, horse, mouse.    Continue environmental control measures.  May use over the counter antihistamines such as Zyrtec (cetirizine), Claritin (loratadine), Allegra (fexofenadine), or Xyzal (levocetirizine) daily as needed.  May use Flonase 1 spray 1-2 times a day as needed.  Other atopic dermatitis  Continue dupixent injections every 2 weeks at home.   Continue proper skin care measures.   Hands  Try to keep it dry at work.  Wear a cotton glove under the rubber glove - get latex free.  Use the following for the hands:   May use Eucrisa (crisaborole) 2% ointment twice a day on mild eczema flares on the face and body. This is a non-steroid ointment.   If it burns, place the medication in the refrigerator.   Apply a thin layer of moisturizer and then apply the Eucrisa on top of it. Medications:  Only apply to affected areas that are "rough and red" Body:   May use triamcinolone 0.1% ointment twice a day as needed for eczema flares. Do  not use on the face, neck, armpits or groin area. Do not use more than 3 weeks in a row.   For more than twice a day use the following: Aquaphor, Vaseline, Cerave, Cetaphil, Eucerin, Vanicream.  Itching:  Take Zyrtec 10mg  in the morning as needed.   Adverse food reaction  Past skin testing showed: peanuts, tree nuts, sesame.  Continue to avoid peanuts, tree nuts, sesame and shellfish. Okay to eat shrimp as before.   For mild symptoms you can take over the counter antihistamines such as Benadryl and monitor symptoms closely. If symptoms worsen or if you have severe symptoms including breathing issues, throat closure, significant swelling, whole body hives, severe diarrhea and vomiting, lightheadedness then inject epinephrine and seek immediate medical care afterwards.  Schedule for crab challenge.   If interested we can schedule food challenge to crabs. You must be off antihistamines for 3-5 days before. Must be in good health and not ill. Plan on being in the office for 2-3 hours and must bring in the food you want to do the oral challenge for. You must call to schedule an appointment and specify it's for a food challenge.   Follow up in 6 months or sooner if needed.

## 2021-05-11 ENCOUNTER — Ambulatory Visit: Payer: BC Managed Care – PPO | Admitting: Allergy

## 2021-05-11 DIAGNOSIS — J309 Allergic rhinitis, unspecified: Secondary | ICD-10-CM

## 2021-05-11 NOTE — Progress Notes (Deleted)
Follow Up Note  RE: [name] [name] MRN: [id number] DOB: 09/15/1990 Date of Office Visit: 05/11/2021  Referring provider: No ref. provider found Primary care provider: Patient, No Pcp Per (Inactive)  Chief Complaint: No chief complaint on file.  History of Present Illness: I had the pleasure of seeing [name] [name] for a follow up visit at the [facility] on 05/11/2021. She is a 30 y.o. female, who is being followed for asthma, food allergy, allergic rhinoconjunctivitis and atopic dermatitis. Her previous allergy office visit was on 11/08/2020 with Dr. Selena Batten. Today is a regular follow up visit.  Moderate persistent asthma without complication Past history - Diagnosed with asthma over 20 years ago.  Using Symbicort 160 1 puff once or twice a day as needed and albuterol as needed with good benefit.  Tried Advair and Singulair in the past.  2020 spirometry showed consistent with possible restrictive disease with 11% improvement in FEV1 post bronchodilator treatment.   Interim history - not using any daily inhalers. Today's spirometry was normal. ACT score 25.  The Dupixent for the atopic dermatitis is probably also helping her asthma symptoms.  Daily controller medication(s):   None. During upper respiratory infections/asthma flares: start Symbicort 2 puffs twice a day for 1-2 weeks until your breathing symptoms return to baseline.  May use albuterol rescue inhaler 2 puffs every 4 to 6 hours as needed for shortness of breath, chest tightness, coughing, and wheezing. May use albuterol rescue inhaler 2 puffs 5 to 15 minutes prior to strenuous physical activities. Monitor frequency of use.  Repeat spirometry at next visit.    Allergy with anaphylaxis due to food, subsequent encounter Past history - Reactions to peanuts and shellfish in the past but able to eat shrimp with no issues. 2020 skin testing showed: peanuts, tree nuts, sesame. 2020 immunocap borderline  positive to shellfish.  Interim history - accidental exposure to peanut containing eggroll requiring ER visit with IM epi. Stressed importance of being careful about eating out and having epinephrine device on hand.  Continue to avoid peanuts, tree nuts, sesame and shellfish. Okay to eat shrimp as before.  For mild symptoms you can take over the counter antihistamines such as Benadryl and monitor symptoms closely. If symptoms worsen or if you have severe symptoms including breathing issues, throat closure, significant swelling, whole body hives, severe diarrhea and vomiting, lightheadedness then inject epinephrine and seek immediate medical care afterwards. Schedule for crab challenge.  If interested we can schedule food challenge to crabs. You must be off antihistamines for 3-5 days before. Must be in good health and not ill. Plan on being in the office for 2-3 hours and must bring in the food you want to do the oral challenge for. You must call to schedule an appointment and specify it's for a food challenge.    Seasonal and perennial allergic rhinoconjunctivitis Past history - Perennial rhinoconjunctivitis symptoms for the past 10 years but worse the last year.  Symptoms seem to flare during the spring.  Tried Flonase and Claritin with some benefit.  Used to be on allergy injections possibly for a year with some benefit as a child.  No previous ENT evaluation. 2020 skin testing showed: Positive to grass, tree, ragweed, weed, dust mites, dog, cat, mold, horse, mouse.   Interim history - well controlled. Continue environmental control measures. May use over the counter antihistamines such as Zyrtec (cetirizine), Claritin (loratadine), Allegra (fexofenadine), or Xyzal (levocetirizine) daily as needed. May use  Flonase 1 spray 1-2 times a day as needed.   Other atopic dermatitis Past history - Eczema since a young child.  Currently using clobetasol almost daily on the face with good benefit.  Has tried  Elidel and hydrocortisone in the past with no improvement in symptoms.  Patient has patches of hyperpigmented with leathery/hyperkeratotic skin changes on popliteal fossa b/l and hands.  Interim history - much improved with Dupixent but hands flaring due to work. Continue Dupixent injections every 2 weeks at home.  Try to keep hands dry at work. Wear a cotton glove under the rubber glove - get latex free. Use the following for the hands:  May use Eucrisa (crisaborole) 2% ointment twice a day on mild eczema flares on the face and body. This is a non-steroid ointment. Samples given. Medications: Only apply to affected areas that are "rough and red" Body:  May use triamcinolone 0.1% ointment twice a day as needed for eczema flares. Do not use on the face, neck, armpits or groin area. Do not use more than 3 weeks in a row.  For more than twice a day use the following: Aquaphor, Vaseline, Cerave, Cetaphil, Eucerin, Vanicream.  Itching: Take Zyrtec 10mg  in the morning as needed.    Return in about 6 months (around 05/11/2021).    Assessment and Plan: [name] [name] is a 30 y.o. female with: No problem-specific Assessment & Plan notes found for this encounter.  No follow-ups on file.  No orders of the defined types were placed in this encounter.  Lab Orders  No laboratory test(s) ordered today    Diagnostics: Spirometry:  Tracings reviewed. Her effort: {Blank single:19197::"Good reproducible efforts.","It was hard to get consistent efforts and there is a question as to whether this reflects a maximal maneuver.","Poor effort, data can not be interpreted."} FVC: ***L FEV1: ***L, ***% predicted FEV1/FVC ratio: ***% Interpretation: {Blank single:19197::"Spirometry consistent with mild obstructive disease","Spirometry consistent with moderate obstructive disease","Spirometry consistent with severe obstructive disease","Spirometry consistent with possible restrictive disease","Spirometry consistent  with mixed obstructive and restrictive disease","Spirometry uninterpretable due to technique","Spirometry consistent with normal pattern","No overt abnormalities noted given today's efforts"}.  Please see scanned spirometry results for details.  Skin Testing: {Blank single:19197::"Select foods","Environmental allergy panel","Environmental allergy panel and select foods","Food allergy panel","None","Deferred due to recent antihistamines use"}. *** Results discussed with patient/family.   Medication List:  Current Outpatient Medications  Medication Sig Dispense Refill   albuterol (PROVENTIL HFA;VENTOLIN HFA) 108 (90 Base) MCG/ACT inhaler Inhale 1-2 puffs into the lungs every 6 (six) hours as needed for wheezing or shortness of breath. 1 Inhaler 0   budesonide-formoterol (SYMBICORT) 80-4.5 MCG/ACT inhaler Inhale 1 puff into the lungs in the morning and at bedtime. 1 each 5   Crisaborole (EUCRISA) 2 % OINT 1 application 2 times daily on hands. 100 g 5   dupilumab (DUPIXENT) 300 MG/2ML prefilled syringe Inject 300 mg into the skin every 14 (fourteen) days. 4 mL 11   EPINEPHrine 0.3 mg/0.3 mL IJ SOAJ injection Inject 0.3 mg into the muscle as needed for anaphylaxis. 1 each 0   fluticasone (FLONASE) 50 MCG/ACT nasal spray Place 2 sprays into both nostrils daily. FOR NASAL CONGESTION (Patient not taking: Reported on 11/08/2020) 16 g 5   hydrocortisone 2.5 % ointment Apply topically 2 (two) times daily. May use on the face up to 7 days at a time. 30 g 3   loratadine (CLARITIN) 10 MG tablet Take 1 tablet (10 mg total) by mouth daily. 30 tablet 0   mometasone (  ELOCON) 0.1 % ointment Apply topically 2 (two) times daily. Below the neck up to 3 weeks a time. May use for flares. 45 g 3   triamcinolone ointment (KENALOG) 0.1 % Apply 1 application topically 2 (two) times daily as needed. Do not use on the face, neck, armpits or groin area. Do not use more than 3 weeks in a row. 80 g 1   No current  facility-administered medications for this visit.   Allergies: Allergies  Allergen Reactions   Peanut-Containing Drug Products Anaphylaxis    REACTION: unspecified   Shrimp [Shellfish Allergy] Hives   Latex Hives   Penicillins Hives   I reviewed her past medical history, social history, family history, and environmental history and no significant changes have been reported from her previous visit.  Review of Systems  Constitutional:  Negative for appetite change, chills, fever and unexpected weight change.  HENT:  Negative for congestion and rhinorrhea.   Eyes:  Negative for itching.  Respiratory:  Negative for cough, chest tightness, shortness of breath and wheezing.   Gastrointestinal:  Negative for abdominal pain.  Skin:  Positive for rash.  Allergic/Immunologic: Positive for environmental allergies and food allergies.  Neurological:  Negative for headaches.   Objective: There were no vitals taken for this visit. There is no height or weight on file to calculate BMI. Physical Exam Vitals and nursing note reviewed.  Constitutional:      Appearance: Normal appearance. She is well-developed.  HENT:     Head: Normocephalic and atraumatic.     Right Ear: Tympanic membrane and external ear normal.     Left Ear: Tympanic membrane and external ear normal.     Nose: Nose normal.     Mouth/Throat:     Mouth: Mucous membranes are moist.     Pharynx: Oropharynx is clear.  Eyes:     Conjunctiva/sclera: Conjunctivae normal.  Cardiovascular:     Rate and Rhythm: Normal rate and regular rhythm.     Heart sounds: Normal heart sounds. No murmur heard.   No friction rub. No gallop.  Pulmonary:     Effort: Pulmonary effort is normal.     Breath sounds: Normal breath sounds. No wheezing or rales.  Musculoskeletal:     Cervical back: Neck supple.  Skin:    General: Skin is warm and dry.     Findings: Rash present.     Comments: Dry patches on the hands b/l with hyperkeratotic and  hypopigmented changes.   Neurological:     Mental Status: She is alert and oriented to person, place, and time.  Psychiatric:        Behavior: Behavior normal.   Previous notes and tests were reviewed. The plan was reviewed with the patient/family, and all questions/concerned were addressed.  It was my pleasure to see [name] [name] today and participate in her care. Please feel free to contact me with any questions or concerns.  Sincerely,  Wyline Mood, DO Allergy & Immunology  [facility] office: [phone] [facility] office: [phone]

## 2021-05-28 ENCOUNTER — Other Ambulatory Visit: Payer: Self-pay | Admitting: Allergy

## 2021-05-30 ENCOUNTER — Other Ambulatory Visit: Payer: Self-pay

## 2021-05-30 MED ORDER — TRIAMCINOLONE ACETONIDE 0.1 % EX OINT: 1.0000 "application " | TOPICAL_OINTMENT | Freq: Two times a day (BID) | CUTANEOUS | 1 refills | Status: DC | PRN

## 2021-08-12 ENCOUNTER — Other Ambulatory Visit: Payer: Self-pay | Admitting: Allergy

## 2021-08-12 ENCOUNTER — Telehealth: Payer: Self-pay | Admitting: Allergy

## 2021-08-12 NOTE — Telephone Encounter (Signed)
Patient is requesting a refill on her inhaler. She is currently using "Wixela" (which she found on [employer]) and states she is on that one due to Symbicort being more expensive. She is wondering if this inhaler could be sent in for her since her new insurance does not begin until January. Patient scheduled an OV for 01/04 with Chrissie.   Best pharmacy- [facility] on [address]

## 2021-08-12 NOTE — Telephone Encounter (Signed)
Dr. Selena Batten are you ok with the patient receiving a refill of Wixela?

## 2021-08-12 NOTE — Telephone Encounter (Signed)
Can you clarify what does she is taking right now and how often?  According to my last note she is not on a daily inhaler but only to take when asthma flares or when she's sick.

## 2021-08-12 NOTE — Telephone Encounter (Signed)
Spoke with patient, she stated she is using the Wixela 250/50 mcg due to Symbicort costing too much. Please advise.

## 2021-08-14 MED ORDER — FLUTICASONE-SALMETEROL 250-50 MCG/ACT IN AEPB: 1.0000 | INHALATION_SPRAY | Freq: Two times a day (BID) | RESPIRATORY_TRACT | 0 refills | Status: DC

## 2021-08-14 NOTE — Addendum Note (Signed)
Addended by: Ellamae Sia on: 08/14/2021 10:52 PM   Modules accepted: Orders

## 2021-08-14 NOTE — Telephone Encounter (Signed)
Sent in Wixea 1 puff twice a day.  Only 1 inhaler with no refill as patient due for OV.  Keep appointment in January.

## 2021-08-15 NOTE — Telephone Encounter (Signed)
Spoke with patient, informed her of Dr. Elmyra Ricks note. She scheduled a follow up for 09/07/2021 at 9:30 am. Patient has been informed to keep this appointment for further refills.

## 2021-08-31 ENCOUNTER — Ambulatory Visit: Payer: Self-pay | Admitting: Family

## 2021-09-06 NOTE — Patient Instructions (Addendum)
Moderate persistent asthma Start doxycycline 100 mg twice a day for 7 days. Let us know if you are not getting any better. Daily controller medication(s):  Wixela 250/50 1 puff twice a day to help prevent cough and wheeze  May use albuterol rescue inhaler 2 puffs every 4 to 6 hours as needed for shortness of breath, chest tightness, coughing, and wheezing. May use albuterol rescue inhaler 2 puffs 5 to 15 minutes prior to strenuous physical activities. Monitor frequency of use.  Asthma control goals:  Full participation in all desired activities (may need albuterol before activity) Albuterol use two times or less a week on average (not counting use with activity) Cough interfering with sleep two times or less a month Oral steroids no more than once a year No hospitalizations  Other allergic rhinitis 2020 skin testing showed: Positive to grass, tree, ragweed, weed, dust mites, dog, cat, mold, horse, mouse.   Continue environmental control measures. May use over the counter antihistamines such as Zyrtec (cetirizine), Claritin (loratadine), Allegra (fexofenadine), or Xyzal (levocetirizine) daily as needed. May use Flonase 1 spray 1-2 times a day as needed.   Other atopic dermatitis Continue dupixent injections every 2 weeks at home.  Continue proper skin care measures.   Hands Try to keep it dry at work. Wear a cotton glove under the rubber glove - get latex free. Use the following for the hands:  May use Eucrisa (crisaborole) 2% ointment twice a day on mild eczema flares on the face and body. This is a non-steroid ointment.  If it burns, place the medication in the refrigerator.  Apply a thin layer of moisturizer and then apply the Eucrisa on top of it. Medications: Only apply to affected areas that are rough and red Body:  May use triamcinolone 0.1% ointment twice a day as needed for eczema flares. Do not use on the face, neck, armpits or groin area. Do not use more than 3 weeks in a  row.  For more than twice a day use the following: Aquaphor, Vaseline, Cerave, Cetaphil, Eucerin, Vanicream.  Itching: Continue Claritin 10mg  in the morning as needed.  See diagram below for Dupixent injection sites     Adverse food reaction Past skin testing showed: peanuts, tree nuts, sesame. Continue to avoid peanuts, tree nuts, sesame and shellfish. Okay to eat shrimp as before.  For mild symptoms you can take over the counter antihistamines such as Benadryl and monitor symptoms closely. If symptoms worsen or if you have severe symptoms including breathing issues, throat closure, significant swelling, whole body hives, severe diarrhea and vomiting, lightheadedness then inject epinephrine and seek immediate medical care afterwards. Schedule for crab challenge.  If interested we can schedule food challenge to crabs. You must be off antihistamines for 3-5 days before. Must be in good health and not ill. Plan on being in the office for 2-3 hours and must bring in the food you want to do the oral challenge for. You must call to schedule an appointment and specify it's for a food challenge.    Follow up in 3 months or sooner if needed.

## 2021-09-07 ENCOUNTER — Encounter: Payer: Self-pay | Admitting: Family

## 2021-09-07 ENCOUNTER — Ambulatory Visit (INDEPENDENT_AMBULATORY_CARE_PROVIDER_SITE_OTHER): Payer: 59 | Admitting: Family

## 2021-09-07 ENCOUNTER — Other Ambulatory Visit: Payer: Self-pay

## 2021-09-07 VITALS — BP 124/78 | HR 78 | Temp 97.3°F | Resp 16 | Ht 67.32 in

## 2021-09-07 DIAGNOSIS — J4540 Moderate persistent asthma, uncomplicated: Secondary | ICD-10-CM | POA: Diagnosis not present

## 2021-09-07 DIAGNOSIS — L2089 Other atopic dermatitis: Secondary | ICD-10-CM

## 2021-09-07 DIAGNOSIS — H1010 Acute atopic conjunctivitis, unspecified eye: Secondary | ICD-10-CM

## 2021-09-07 DIAGNOSIS — H1013 Acute atopic conjunctivitis, bilateral: Secondary | ICD-10-CM | POA: Diagnosis not present

## 2021-09-07 DIAGNOSIS — T7800XD Anaphylactic reaction due to unspecified food, subsequent encounter: Secondary | ICD-10-CM

## 2021-09-07 DIAGNOSIS — J302 Other seasonal allergic rhinitis: Secondary | ICD-10-CM | POA: Diagnosis not present

## 2021-09-07 MED ORDER — ALBUTEROL SULFATE HFA 108 (90 BASE) MCG/ACT IN AERS: 1.0000 | INHALATION_SPRAY | Freq: Four times a day (QID) | RESPIRATORY_TRACT | 1 refills | Status: DC | PRN

## 2021-09-07 MED ORDER — LORATADINE 10 MG PO TABS: 10.0000 mg | ORAL_TABLET | Freq: Every day | ORAL | 5 refills | Status: DC

## 2021-09-07 MED ORDER — EPINEPHRINE 0.3 MG/0.3ML IJ SOAJ
0.3000 mg | INTRAMUSCULAR | 0 refills | Status: DC | PRN
Start: 2021-09-07 — End: 2021-09-07

## 2021-09-07 MED ORDER — EPINEPHRINE 0.3 MG/0.3ML IJ SOAJ: 0.3000 mg | INTRAMUSCULAR | 1 refills | Status: DC | PRN

## 2021-09-07 MED ORDER — DOXYCYCLINE MONOHYDRATE 100 MG PO TABS
100.0000 mg | ORAL_TABLET | Freq: Two times a day (BID) | ORAL | 0 refills | Status: DC
Start: 2021-09-07 — End: 2023-02-07

## 2021-09-07 MED ORDER — FLUTICASONE-SALMETEROL 250-50 MCG/ACT IN AEPB
1.0000 | INHALATION_SPRAY | Freq: Two times a day (BID) | RESPIRATORY_TRACT | 3 refills | Status: DC
Start: 2021-09-07 — End: 2022-01-25

## 2021-09-07 MED ORDER — ALBUTEROL SULFATE HFA 108 (90 BASE) MCG/ACT IN AERS: 1.0000 | INHALATION_SPRAY | Freq: Four times a day (QID) | RESPIRATORY_TRACT | 0 refills | Status: DC | PRN

## 2021-09-07 MED ORDER — EUCRISA 2 % EX OINT: TOPICAL_OINTMENT | CUTANEOUS | 5 refills | Status: DC

## 2021-09-07 MED ORDER — FLUTICASONE PROPIONATE 50 MCG/ACT NA SUSP: 2.0000 | Freq: Every day | NASAL | 5 refills | Status: DC

## 2021-09-07 NOTE — Progress Notes (Signed)
[place] [address] Dept: [phone]  FOLLOW UP NOTE  Patient ID: [name] [name], female    DOB: 04-08-1991  Age: 31 y.o. MRN: [id number] Date of Office Visit: 09/07/2021  Assessment  Chief Complaint: Asthma (ACT 22/Cough for the last week or so. Yellow colored mucous but patient states that she doesn't feel sick.) and Allergic Rhinitis  (Good.)  HPI [name] [name] is a 31 year old female who presents today for follow-up of moderate persistent asthma, seasonal and perennial allergic rhinoconjunctivitis, atopic dermatitis, and allergy with anaphylaxis due to food.  She was last seen on November 08, 2020 by Dr. Maudie Mercury.  She denies any new diagnosis or surgeries since her last office visit.  Moderate persistent asthma is reported as moderately controlled with Wixela 250/50 1 puff twice a day and albuterol as needed.  She reports that she started using Wixela 1 puff twice a day when her asthma symptoms became more frequent.  For the past week or so she has had a cough that is sometimes dry and sometimes productive but what she is coughing up is light yellow in color.  She denies wheezing, tightness in her chest, shortness of breath, nocturnal awakenings, fever, chills, body aches, or sick contacts.  Since her last office visit she has not required any systemic steroids and has not made any trips to the emergency room or urgent care due to breathing problems.  She reports very rare use of albuterol.  She continues to avoid peanuts, tree nuts, sesame, and shellfish without any accidental ingestion or use of her EpiPen.  She is still able to eat shrimp without any problems.  Seasonal and perennial allergic rhinoconjunctivitis is reported as controlled with Claritin 10 mg once a day and Flonase nasal spray as needed.  She denies rhinorrhea, nasal congestion, and postnasal drip.  She has not had any sinus infections since we last saw her.  Atopic dermatitis is reported as  doing great with Dupixent injections every 2 weeks at home, Eucrisa 2% ointment as needed, and triamcinolone 0.1% ointment as needed.  She is so thankful for Dupixent.  Her last Dupixent injection was yesterday.  She denies any reactions or problems.  She uses  unscented Vaseline for moisturization.  She wants to know if there are other options for where she can give her Dupixent injections.   Drug Allergies:  Allergies  Allergen Reactions   Peanut-Containing Drug Products Anaphylaxis    REACTION: unspecified   Shrimp [Shellfish Allergy] Hives   Latex Hives   Penicillins Hives    Review of Systems: Review of Systems  Constitutional:  Negative for chills and fever.  HENT:         Denies rhinorrhea, nasal congestion, and postnasal drip  Eyes:        Denies itchy watery eyes  Respiratory:  Positive for cough and sputum production. Negative for shortness of breath and wheezing.   Cardiovascular:  Negative for chest pain and palpitations.  Gastrointestinal:        Denies heartburn or reflux symptoms  Genitourinary:  Negative for frequency.  Skin:  Negative for itching and rash.  Neurological:  Negative for headaches.  Endo/Heme/Allergies:  Positive for environmental allergies.    Physical Exam: BP 124/78    Pulse 78    Temp (!) 97.3 F (36.3 C) (Temporal)    Resp 16    Ht 5' 7.32" (1.71 m)    SpO2 99%    BMI 48.86 kg/m  Physical Exam  Diagnostics: FVC 3.41 L, FEV1 2.67 L (88%).  Predicted FVC 3.60 L, predicted FEV1 3.04 L.  Spirometry indicates normal respiratory function.  Assessment and Plan: 1. Moderate persistent asthma without complication   2. Other atopic dermatitis   3. Allergy with anaphylaxis due to food, subsequent encounter   4. Seasonal and perennial allergic rhinoconjunctivitis     Meds ordered this encounter  Medications   doxycycline (ADOXA) 100 MG tablet    Sig: Take 1 tablet (100 mg total) by mouth 2 (two) times daily.    Dispense:  14 tablet     Refill:  0   Crisaborole (EUCRISA) 2 % OINT    Sig: 1 application 2 times daily on hands AS Needed to red itchy areas.    Dispense:  100 g    Refill:  5   DISCONTD: albuterol (VENTOLIN HFA) 108 (90 Base) MCG/ACT inhaler    Sig: Inhale 1-2 puffs into the lungs every 6 (six) hours as needed for wheezing or shortness of breath.    Dispense:  1 each    Refill:  0   fluticasone (FLONASE) 50 MCG/ACT nasal spray    Sig: Place 2 sprays into both nostrils daily. Place 2 sprays in each nostril once a day as needed for stuffy nose    Dispense:  16 g    Refill:  5   loratadine (CLARITIN) 10 MG tablet    Sig: Take 1 tablet (10 mg total) by mouth daily.    Dispense:  30 tablet    Refill:  5   DISCONTD: albuterol (VENTOLIN HFA) 108 (90 Base) MCG/ACT inhaler    Sig: Inhale 1-2 puffs into the lungs every 6 (six) hours as needed for wheezing or shortness of breath.    Dispense:  1 each    Refill:  0   albuterol (VENTOLIN HFA) 108 (90 Base) MCG/ACT inhaler    Sig: Inhale 1-2 puffs into the lungs every 6 (six) hours as needed for wheezing or shortness of breath.    Dispense:  1 each    Refill:  1   EPINEPHrine 0.3 mg/0.3 mL IJ SOAJ injection    Sig: Inject 0.3 mg into the muscle as needed for anaphylaxis.    Dispense:  1 each    Refill:  0   fluticasone-salmeterol (WIXELA INHUB) 250-50 MCG/ACT AEPB    Sig: Inhale 1 puff into the lungs in the morning and at bedtime. Rinse mouth after each use.    Dispense:  60 each    Refill:  3    Patient Instructions  Moderate persistent asthma Start doxycycline 100 mg twice a day for 7 days. Let us know if you are not getting any better. Daily controller medication(s):  Wixela 250/50 1 puff twice a day to help prevent cough and wheeze  May use albuterol rescue inhaler 2 puffs every 4 to 6 hours as needed for shortness of breath, chest tightness, coughing, and wheezing. May use albuterol rescue inhaler 2 puffs 5 to 15 minutes prior to strenuous physical  activities. Monitor frequency of use.  Asthma control goals:  Full participation in all desired activities (may need albuterol before activity) Albuterol use two times or less a week on average (not counting use with activity) Cough interfering with sleep two times or less a month Oral steroids no more than once a year No hospitalizations  Other allergic rhinitis 2020 skin testing showed: Positive to grass, tree, ragweed, weed, dust mites, dog, cat, mold,  horse, mouse.   Continue environmental control measures. May use over the counter antihistamines such as Zyrtec (cetirizine), Claritin (loratadine), Allegra (fexofenadine), or Xyzal (levocetirizine) daily as needed. May use Flonase 1 spray 1-2 times a day as needed.   Other atopic dermatitis Continue dupixent injections every 2 weeks at home.  Continue proper skin care measures.   Hands Try to keep it dry at work. Wear a cotton glove under the rubber glove - get latex free. Use the following for the hands:  May use Eucrisa (crisaborole) 2% ointment twice a day on mild eczema flares on the face and body. This is a non-steroid ointment.  If it burns, place the medication in the refrigerator.  Apply a thin layer of moisturizer and then apply the Eucrisa on top of it. Medications: Only apply to affected areas that are rough and red Body:  May use triamcinolone 0.1% ointment twice a day as needed for eczema flares. Do not use on the face, neck, armpits or groin area. Do not use more than 3 weeks in a row.  For more than twice a day use the following: Aquaphor, Vaseline, Cerave, Cetaphil, Eucerin, Vanicream.  Itching: Continue Claritin 10mg  in the morning as needed.  See diagram below for Dupixent injection sites     Adverse food reaction Past skin testing showed: peanuts, tree nuts, sesame. Continue to avoid peanuts, tree nuts, sesame and shellfish. Okay to eat shrimp as before.  For mild symptoms you can take over the counter  antihistamines such as Benadryl and monitor symptoms closely. If symptoms worsen or if you have severe symptoms including breathing issues, throat closure, significant swelling, whole body hives, severe diarrhea and vomiting, lightheadedness then inject epinephrine and seek immediate medical care afterwards. Schedule for crab challenge.  If interested we can schedule food challenge to crabs. You must be off antihistamines for 3-5 days before. Must be in good health and not ill. Plan on being in the office for 2-3 hours and must bring in the food you want to do the oral challenge for. You must call to schedule an appointment and specify it's for a food challenge.    Follow up in 3 months or sooner if needed.    Return in about 3 months (around 12/06/2021), or if symptoms worsen or fail to improve.    Thank you for the opportunity to care for this patient.  Please do not hesitate to contact me with questions.  [name] Charon, FNP [facility]

## 2021-09-07 NOTE — Addendum Note (Signed)
Addended by: Orson Aloe on: 09/07/2021 05:25 PM   Modules accepted: Orders

## 2021-09-07 NOTE — Addendum Note (Signed)
Addended by: Orson Aloe on: 09/07/2021 12:17 PM   Modules accepted: Orders

## 2021-11-26 HISTORY — PX: TARSAL METATARSAL ARTHRODESIS: SHX2481

## 2021-12-05 ENCOUNTER — Encounter (HOSPITAL_BASED_OUTPATIENT_CLINIC_OR_DEPARTMENT_OTHER): Payer: Self-pay

## 2021-12-05 ENCOUNTER — Emergency Department (HOSPITAL_BASED_OUTPATIENT_CLINIC_OR_DEPARTMENT_OTHER)
Admission: EM | Admit: 2021-12-05 | Discharge: 2021-12-05 | Disposition: A | Discharge status: Home / Self Care | Payer: No Typology Code available for payment source | Attending: Emergency Medicine | Admitting: Emergency Medicine

## 2021-12-05 ENCOUNTER — Emergency Department (HOSPITAL_BASED_OUTPATIENT_CLINIC_OR_DEPARTMENT_OTHER): Payer: No Typology Code available for payment source

## 2021-12-05 ENCOUNTER — Other Ambulatory Visit: Payer: Self-pay

## 2021-12-05 DIAGNOSIS — W1789XA Other fall from one level to another, initial encounter: Secondary | ICD-10-CM | POA: Insufficient documentation

## 2021-12-05 DIAGNOSIS — S92354B Nondisplaced fracture of fifth metatarsal bone, right foot, initial encounter for open fracture: Secondary | ICD-10-CM

## 2021-12-05 DIAGNOSIS — S99921A Unspecified injury of right foot, initial encounter: Secondary | ICD-10-CM | POA: Diagnosis present

## 2021-12-05 DIAGNOSIS — Y990 Civilian activity done for income or pay: Secondary | ICD-10-CM | POA: Insufficient documentation

## 2021-12-05 DIAGNOSIS — M79671 Pain in right foot: Secondary | ICD-10-CM | POA: Diagnosis not present

## 2021-12-05 DIAGNOSIS — S92354A Nondisplaced fracture of fifth metatarsal bone, right foot, initial encounter for closed fracture: Secondary | ICD-10-CM | POA: Diagnosis not present

## 2021-12-05 MED ORDER — IBUPROFEN 800 MG PO TABS
800.0000 mg | ORAL_TABLET | Freq: Once | ORAL | Status: AC
  Administered 2021-12-05: 800 mg via ORAL
  Filled 2021-12-05: qty 1

## 2021-12-05 NOTE — Discharge Instructions (Signed)
Stay nonweightbearing, use the crutches to get around.  Tomorrow morning call orthopedics and set up an appointment to be evaluated in the office this week.  Ideally should be seen in the next 3 to 5 days.  Take Tylenol and Motrin for pain, take 800 mg of ibuprofen every 8 hours. ?

## 2021-12-05 NOTE — ED Triage Notes (Signed)
Patient here POV from Work. ? ?Endorses falling off Work Vehicle approximately 2-3 Hours PTA and injuring her Left Foot. Pulse Palpable Distally. No Obvious Deformity noted. ? ?NAD Noted during Triage. A&Ox4. GCS 15. BIB Wheelchair. ?

## 2021-12-05 NOTE — ED Provider Notes (Signed)
?[facility] ?Provider Note ? ? ?CSN: [id number] ?Arrival date & time: 12/05/21  2039 ? ?  ? ?History ? ?Chief Complaint  ?Patient presents with  ? Foot Injury  ? ? ?[name] [name] is a 31 y.o. female. ? ? ?Foot Injury ? ?Patient presents with pain to left foot.  Started acutely when she was at work, she fell out of the [employer] and has been having pain to the lateral side of her left foot since this happened about 2 to 3 hours ago.  Denies any paresthesias, no ankle pain or knee pain. ? ?Home Medications ?Prior to Admission medications   ?Medication Sig Start Date End Date Taking? Authorizing Provider  ?albuterol (VENTOLIN HFA) 108 (90 Base) MCG/ACT inhaler Inhale 1-2 puffs into the lungs every 6 (six) hours as needed for wheezing or shortness of breath. 09/07/21   Nehemiah Settle, FNP  ?Crisaborole (EUCRISA) 2 % OINT 1 application 2 times daily on hands AS Needed to red itchy areas. 09/07/21   Nehemiah Settle, FNP  ?doxycycline (ADOXA) 100 MG tablet Take 1 tablet (100 mg total) by mouth 2 (two) times daily. 09/07/21   Nehemiah Settle, FNP  ?dupilumab (DUPIXENT) 300 MG/2ML prefilled syringe Inject 300 mg into the skin every 14 (fourteen) days. 03/15/20   Ellamae Sia, DO  ?EPINEPHrine 0.3 mg/0.3 mL IJ SOAJ injection Inject 0.3 mg into the muscle as needed for anaphylaxis. 09/07/21   Nehemiah Settle, FNP  ?fluticasone (FLONASE) 50 MCG/ACT nasal spray Place 2 sprays into both nostrils daily. Place 2 sprays in each nostril once a day as needed for stuffy nose 09/07/21   Nehemiah Settle, FNP  ?fluticasone-salmeterol (WIXELA INHUB) 250-50 MCG/ACT AEPB Inhale 1 puff into the lungs in the morning and at bedtime. Rinse mouth after each use. 09/07/21   Nehemiah Settle, FNP  ?hydrocortisone 2.5 % ointment Apply topically 2 (two) times daily. May use on the face up to 7 days at a time. 11/10/19   Ellamae Sia, DO  ?loratadine (CLARITIN) 10 MG tablet Take 1 tablet (10 mg total) by mouth daily.  09/07/21   Nehemiah Settle, FNP  ?mometasone (ELOCON) 0.1 % ointment Apply topically 2 (two) times daily. Below the neck up to 3 weeks a time. May use for flares. ?Patient not taking: Reported on 09/07/2021 11/10/19   Ellamae Sia, DO  ?triamcinolone ointment (KENALOG) 0.1 % Apply 1 application topically 2 (two) times daily as needed. Do not use on the face, neck, armpits or groin area. Do not use more than 3 weeks in a row. ?Patient not taking: Reported on 09/07/2021 05/30/21   Ellamae Sia, DO  ?   ? ?Allergies    ?Peanut-containing drug products, Shrimp [shellfish allergy], Latex, and Penicillins   ? ?Review of Systems   ?Review of Systems ? ?Physical Exam ?Updated Vital Signs ?BP 130/76 (BP Location: Right Arm)   Pulse 81   Temp 97.9 ?F (36.6 ?C)   Resp 20   SpO2 97%  ?Physical Exam ?Vitals and nursing note reviewed. Exam conducted with a chaperone present.  ?Constitutional:   ?   General: She is not in acute distress. ?   Appearance: Normal appearance.  ?HENT:  ?   Head: Normocephalic and atraumatic.  ?Eyes:  ?   General: No scleral icterus. ?   Extraocular Movements: Extraocular movements intact.  ?   Pupils: Pupils are equal, round, and reactive to light.  ?Cardiovascular:  ?   Pulses: Normal pulses.  ?  Musculoskeletal:     ?   General: Tenderness present.  ?Skin: ?   Capillary Refill: Capillary refill takes less than 2 seconds.  ?   Coloration: Skin is not jaundiced.  ?Neurological:  ?   Mental Status: She is alert. Mental status is at baseline.  ?   Coordination: Coordination normal.  ? ? ?ED Results / Procedures / Treatments   ?Labs ?(all labs ordered are listed, but only abnormal results are displayed) ?Labs Reviewed - No data to display ? ?EKG ?None ? ?Radiology ?DG Foot Complete Left ? ?Result Date: 12/05/2021 ?CLINICAL DATA:  Left foot injury after fall. EXAM: LEFT FOOT - COMPLETE 3+ VIEW COMPARISON:  None. FINDINGS: There is an acute nondisplaced transverse fracture through the proximal aspect of the  fourth metatarsal. There is no dislocation. Soft tissues are within normal limits. IMPRESSION: 1. Acute fracture proximal fifth metatarsal. Electronically Signed   By: Darliss Cheney M.D.   On: 12/05/2021 21:27   ? ?Procedures ?Procedures  ? ? ?Medications Ordered in ED ?Medications  ?ibuprofen (ADVIL) tablet 800 mg (800 mg Oral Given 12/05/21 2158)  ? ? ?ED Course/ Medical Decision Making/ A&P ?  ?                        ?Medical Decision Making ?Amount and/or Complexity of Data Reviewed ?Radiology: ordered. ? ?Risk ?Prescription drug management. ? ? ?This patient presents to the ED for concern of foot pain, this involves an extensive number of treatment options, and is a complaint that carries with it a high risk of complications and morbidity.  The differential diagnosis includes but not limited to: fx, dislocation, myalgia, ligament or tendon injury ? ? ?Additional history obtained:  ? ?Independent historian: patient's partner ? ? ? Imaging Studies ordered: ? ?I directly visualized the foot xray, which showed fifth metatarsal fracture which appears to be a Jones fracture ? ?I agree with the radiologist interpretation ?  ? ?Medicines ordered and prescription drug management: ? ?I ordered medication including: Motrin, splint ? ?I have reviewed the patients home medicines and have made adjustments as needed ? ? ?Reevaluation: ? ?After the interventions noted above, I reevaluated the patient and found neurovascularly intact, splint in good position. ? ? ?Problems addressed / ED Course: ?Fifth metatarsal fracture-patient is neurovascular intact with brisk cap refill, DP and PT are 2+.  Patient put in posterior ankle splint, stirrup provided as well as crutches.  Patient is to be nonweightbearing and to follow-up with orthopedics this week.  Discussed pain management with ibuprofen and Tylenol, return precautions discussed. ? ?  ?Disposition: ? ? ?After consideration of the diagnostic results and the patients response  to treatment, I feel that the patent would benefit from orthopedic F/U. ? ?  ? ? ? ? ? ? ? ? ?Final Clinical Impression(s) / ED Diagnoses ?Final diagnoses:  ?Nondisplaced fracture of fifth metatarsal bone, right foot, initial encounter for open fracture  ? ? ?Rx / DC Orders ?ED Discharge Orders   ? ? None  ? ?  ? ? ?  ?Theron Arista, PA-C ?12/05/21 2257 ? ?  ?Cheryll Cockayne, MD ?12/05/21 2321 ? ?

## 2021-12-05 NOTE — ED Notes (Signed)
Discharge paperwork given and understood. 

## 2021-12-12 ENCOUNTER — Ambulatory Visit: Payer: 59 | Admitting: Family

## 2022-01-17 ENCOUNTER — Telehealth: Payer: Self-pay | Admitting: Family

## 2022-01-17 MED ORDER — TRIAMCINOLONE ACETONIDE 0.1 % EX OINT: 1.0000 "application " | TOPICAL_OINTMENT | Freq: Two times a day (BID) | CUTANEOUS | 0 refills | Status: DC | PRN

## 2022-01-17 NOTE — Telephone Encounter (Signed)
Message left for patient to return my call.Rx sent as requested. Patient must keep appointment for any more refills

## 2022-01-17 NOTE — Telephone Encounter (Signed)
Patient called and made appointment for 01/25/2022 with dr Maurine Minister and needs to have Triamcinolone 0.1. [facility] on [address] [phone]

## 2022-01-24 NOTE — Progress Notes (Unsigned)
FOLLOW UP Date of Service/Encounter:  01/25/22   Subjective:  [name] [name] (DOB: [birth year]) is a 31 y.o. female who [facility] in re-evaluation of the following: moderate persistent asthma, seasonal and perennial allergic rhinoconjunctivitis, atopic dermatitis on Dupixent, and allergy with anaphylaxis due to food. History obtained from: chart review and patient.  For Review, LV was on 09/07/21  with Nehemiah Settlehristine Dale, FNP seen for regular follow-up.  She was having a flare of asthma and started on doxycycline x 7 days. FEV1 2.67L (88%) at last visit.  Pertinent History/Diagnostics:  - Asthma: moderate persistent - Allergic Rhinitis:   - SPT environmental panel-2020 skin testing showed: Positive to grass, tree, ragweed, weed, dust mites, dog, cat, mold, horse, mouse.   - Food Allergy (peanuts, tree nuts, sesame and shellfish except shrimp)  -Past skin testing showed: peanuts, tree nuts, sesame.  - shellfish: breaks out into hives, asthma flares. but eats shrimp and tolerates, eats, finned fish  - peanuts and treenuts: has flare of asthma  - sesame: had to use her inhaler - Eczema: controlled on Dupixent, started around 2021  Today presents for follow-up. Asthma: has been 6 months since needing her rescue inhaler, has not required antibiotics or steroids since last visit.  She only uses her Wixela 1 puff daily. Food allergy: No accidental exposures, peanuts, tree nuts, sesame and shellfish except shrimp.  She does eat fish.  Eczema: controlled on Dupixent, does home injections, has been controlled for 2 years, has been life changing for her Allergic rhinitis: controlled, takes OTC claritin as needed, flares are much less than before, no constant sinus infections since starting dupixent  She did break her foot since last visit.  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  Otherwise no new medical issues  since last visit.  Allergies as of 01/25/2022       Reactions   Peanut-containing Drug Products Anaphylaxis   REACTION: unspecified   Shrimp [shellfish Allergy] Hives   Latex Hives   Penicillins Hives        Medication List        Accurate as of Jan 25, 2022  5:29 PM. If you have any questions, ask your nurse or doctor.          albuterol 108 (90 Base) MCG/ACT inhaler Commonly known as: VENTOLIN HFA Inhale 1-2 puffs into the lungs every 6 (six) hours as needed for wheezing or shortness of breath.   doxycycline 100 MG tablet Commonly known as: ADOXA Take 1 tablet (100 mg total) by mouth 2 (two) times daily.   Dupixent 300 MG/2ML prefilled syringe Generic drug: dupilumab Inject 300 mg into the skin every 14 (fourteen) days.   EPINEPHrine 0.3 mg/0.3 mL Soaj injection Commonly known as: EPI-PEN Inject 0.3 mg into the muscle as needed for anaphylaxis.   Eucrisa 2 % Oint Generic drug: Crisaborole 1 application 2 times daily on hands AS Needed to red itchy areas.   fluticasone 50 MCG/ACT nasal spray Commonly known as: FLONASE Place 2 sprays into both nostrils daily. Place 2 sprays in each nostril once a day as needed for stuffy nose   fluticasone-salmeterol 250-50 MCG/ACT Aepb Commonly known as: Wixela Inhub Inhale 1 puff into the lungs in the morning and at bedtime. Rinse mouth after each use.   hydrocortisone 2.5 % ointment Apply topically 2 (two) times daily. May use on the face up to 7  days at a time.   loratadine 10 MG tablet Commonly known as: CLARITIN Take 1 tablet (10 mg total) by mouth daily.   mometasone 0.1 % ointment Commonly known as: ELOCON Apply topically 2 (two) times daily. Below the neck up to 3 weeks a time. May use for flares.   triamcinolone ointment 0.1 % Commonly known as: KENALOG Apply 1 application. topically 2 (two) times daily as needed. Do not use on the face, neck, armpits or groin area. Do not use more than 3 weeks in a row.        Past Medical History:  Diagnosis Date   Allergic rhinitis    Allergy    Asthma    hosp 06/2009   Eczema    Kidney stone    right ER 09/17/09, 707/2012   Past Surgical History:  Procedure Laterality Date   dental extractions     TARSAL METATARSAL ARTHRODESIS Left 11/2021   foot   Otherwise, there have been no changes to her past medical history, surgical history, family history, or social history.  ROS: All others negative except as noted per HPI.   Objective:  BP (!) 146/90   Pulse 80   Temp 97.6 F (36.4 C) (Temporal)   Resp 16   Ht 5\' 9"  (1.753 m) Comment: per patient/ in boot  Wt 295 lb (133.8 kg) Comment: per patient/ in boot  SpO2 97%   BMI 43.56 kg/m  Body mass index is 43.56 kg/m. Physical Exam: General Appearance:  Alert, cooperative, no distress, appears stated age  Head:  Normocephalic, without obvious abnormality, atraumatic  Eyes:  Conjunctiva clear, EOM's intact  Nose: Nares normal, hypertrophic turbinates, normal mucosa, no visible anterior polyps, and septum midline  Throat: Lips, tongue normal; teeth and gums normal, normal posterior oropharynx, tonsils 2+, and no tonsillar exudate  Neck: Supple, symmetrical  Lungs:   clear to auscultation bilaterally, Respirations unlabored, no coughing  Heart:  regular rate and rhythm and no murmur, Appears well perfused  Extremities: No edema  Skin: Skin color, texture, turgor normal, no rashes or lesions on visualized portions of skin  Neurologic: No gross deficits    Spirometry:  Tracings reviewed. Her effort: Good reproducible efforts. FVC: 3.58L FEV1: 3.03L, 96% predicted FEV1/FVC ratio: 100% Interpretation: Spirometry consistent with normal pattern.  Please see scanned spirometry results for details.   Assessment/Plan  is a 31 year old female who we follow for atopic dermatitis, moderate persistent asthma, allergic rhinitis and conjunctivitis and history of multiple food allergies who  reports excellent control of her symptoms since starting Dupixent 2 years ago.  We will continue on her current therapeutic plan. Did offer challenge to crab based on her history and previous testing, but she is not interested in introducing this food at this time.   Moderate persistent asthma-controlled  Daily controller medication(s):  Wixela 250/50 1 puff daily to help prevent cough and wheeze  For Asthma flares: increase to Wixela 250/50 1 puff twice daily to help prevent cough and wheeze May use albuterol rescue inhaler 2 puffs every 4 to 6 hours as needed for shortness of breath, chest tightness, coughing, and wheezing. May use albuterol rescue inhaler 2 puffs 5 to 15 minutes prior to strenuous physical activities. Monitor frequency of use.  Asthma control goals:  Full participation in all desired activities (may need albuterol before activity) Albuterol use two times or less a week on average (not counting use with activity) Cough interfering with sleep two times or less a  month Oral steroids no more than once a year No hospitalizations  Other allergic rhinitis-controlled 2020 skin testing showed: Positive to grass, tree, ragweed, weed, dust mites, dog, cat, mold, horse, mouse.   Continue environmental control measures. May use over the counter antihistamines such as Zyrtec (cetirizine), Claritin (loratadine), Allegra (fexofenadine), or Xyzal (levocetirizine) daily as needed. May use Flonase 1 spray 1-2 times a day as needed.   Other atopic dermatitis-controlled Continue dupixent injections every 2 weeks at home.  Continue proper skin care measures.   Hands-controlled Try to keep it dry at work. Wear a cotton glove under the rubber glove - get latex free. Use the following for the hands:  May use Eucrisa (crisaborole) 2% ointment twice a day on mild eczema flares on the face and body. This is a non-steroid ointment.  If it burns, place the medication in the refrigerator.  Apply a  thin layer of moisturizer and then apply the Eucrisa on top of it. Medications: Only apply to affected areas that are "rough and red" Body:  May use triamcinolone 0.1% ointment twice a day as needed for eczema flares. Do not use on the face, neck, armpits or groin area. Do not use more than 3 weeks in a row.  For more than twice a day use the following: Aquaphor, Vaseline, Cerave, Cetaphil, Eucerin, Vanicream.  Itching: Continue Claritin 10mg  in the morning as needed.  See diagram below for Dupixent injection sites  Adverse food reaction-stable Past skin testing showed: peanuts, tree nuts, sesame. Continue to avoid peanuts, tree nuts, sesame and shellfish. Okay to eat shrimp as before.  For mild symptoms you can take over the counter antihistamines such as Benadryl and monitor symptoms closely. If symptoms worsen or if you have severe symptoms including breathing issues, throat closure, significant swelling, whole body hives, severe diarrhea and vomiting, lightheadedness then inject epinephrine and seek immediate medical care afterwards. Schedule for crab challenge.  If interested we can schedule food challenge to crabs. You must be off antihistamines for 3-5 days before. Must be in good health and not ill. Plan on being in the office for 2-3 hours and must bring in the food you want to do the oral challenge for. You must call to schedule an appointment and specify it's for a food challenge.    Follow up in 6 months or sooner if needed.  It was a pleasure meeting you today!!   , MD  [facility]

## 2022-01-25 ENCOUNTER — Ambulatory Visit (INDEPENDENT_AMBULATORY_CARE_PROVIDER_SITE_OTHER): Payer: 59 | Admitting: Internal Medicine

## 2022-01-25 ENCOUNTER — Encounter: Payer: Self-pay | Admitting: Internal Medicine

## 2022-01-25 VITALS — BP 146/90 | HR 80 | Temp 97.6°F | Resp 16 | Ht 69.0 in | Wt 295.0 lb

## 2022-01-25 DIAGNOSIS — T7800XD Anaphylactic reaction due to unspecified food, subsequent encounter: Secondary | ICD-10-CM

## 2022-01-25 DIAGNOSIS — H1013 Acute atopic conjunctivitis, bilateral: Secondary | ICD-10-CM

## 2022-01-25 DIAGNOSIS — H1010 Acute atopic conjunctivitis, unspecified eye: Secondary | ICD-10-CM

## 2022-01-25 DIAGNOSIS — L2089 Other atopic dermatitis: Secondary | ICD-10-CM

## 2022-01-25 DIAGNOSIS — J4540 Moderate persistent asthma, uncomplicated: Secondary | ICD-10-CM

## 2022-01-25 MED ORDER — FLUTICASONE PROPIONATE 50 MCG/ACT NA SUSP: 2.0000 | Freq: Every day | NASAL | 5 refills | Status: DC

## 2022-01-25 MED ORDER — EPINEPHRINE 0.3 MG/0.3ML IJ SOAJ: 0.3000 mg | INTRAMUSCULAR | 1 refills | Status: DC | PRN

## 2022-01-25 MED ORDER — ALBUTEROL SULFATE HFA 108 (90 BASE) MCG/ACT IN AERS: 1.0000 | INHALATION_SPRAY | Freq: Four times a day (QID) | RESPIRATORY_TRACT | 1 refills | Status: DC | PRN

## 2022-01-25 MED ORDER — TRIAMCINOLONE ACETONIDE 0.1 % EX OINT: 1.0000 "application " | TOPICAL_OINTMENT | Freq: Two times a day (BID) | CUTANEOUS | 0 refills | Status: DC | PRN

## 2022-01-25 MED ORDER — EUCRISA 2 % EX OINT: TOPICAL_OINTMENT | CUTANEOUS | 5 refills | Status: DC

## 2022-01-25 MED ORDER — HYDROCORTISONE 2.5 % EX OINT
TOPICAL_OINTMENT | Freq: Two times a day (BID) | CUTANEOUS | 3 refills | Status: DC
Start: 2022-01-25 — End: 2022-08-02

## 2022-01-25 MED ORDER — LORATADINE 10 MG PO TABS: 10.0000 mg | ORAL_TABLET | Freq: Every day | ORAL | 5 refills | Status: DC

## 2022-01-25 MED ORDER — FLUTICASONE-SALMETEROL 250-50 MCG/ACT IN AEPB: 1.0000 | INHALATION_SPRAY | Freq: Two times a day (BID) | RESPIRATORY_TRACT | 5 refills | Status: DC

## 2022-01-25 NOTE — Patient Instructions (Addendum)
Moderate persistent asthma-controlled  Daily controller medication(s):  Wixela 250/50 1 puff daily to help prevent cough and wheeze  For Asthma flares: increase to Wixela 250/50 1 puff twice daily to help prevent cough and wheeze May use albuterol rescue inhaler 2 puffs every 4 to 6 hours as needed for shortness of breath, chest tightness, coughing, and wheezing. May use albuterol rescue inhaler 2 puffs 5 to 15 minutes prior to strenuous physical activities. Monitor frequency of use.  Asthma control goals:  Full participation in all desired activities (may need albuterol before activity) Albuterol use two times or less a week on average (not counting use with activity) Cough interfering with sleep two times or less a month Oral steroids no more than once a year No hospitalizations  Other allergic rhinitis-controlled 2020 skin testing showed: Positive to grass, tree, ragweed, weed, dust mites, dog, cat, mold, horse, mouse.   Continue environmental control measures. May use over the counter antihistamines such as Zyrtec (cetirizine), Claritin (loratadine), Allegra (fexofenadine), or Xyzal (levocetirizine) daily as needed. May use Flonase 1 spray 1-2 times a day as needed.   Other atopic dermatitis-controlled Continue dupixent injections every 2 weeks at home.  Continue proper skin care measures.   Hands-controlled Try to keep it dry at work. Wear a cotton glove under the rubber glove - get latex free. Use the following for the hands:  May use Eucrisa (crisaborole) 2% ointment twice a day on mild eczema flares on the face and body. This is a non-steroid ointment.  If it burns, place the medication in the refrigerator.  Apply a thin layer of moisturizer and then apply the Eucrisa on top of it. Medications: Only apply to affected areas that are "rough and red" Body:  May use triamcinolone 0.1% ointment twice a day as needed for eczema flares. Do not use on the face, neck, armpits or groin  area. Do not use more than 3 weeks in a row.  For more than twice a day use the following: Aquaphor, Vaseline, Cerave, Cetaphil, Eucerin, Vanicream.  Itching: Continue Claritin 10mg  in the morning as needed.  See diagram below for Dupixent injection sites     Adverse food reaction-stable Past skin testing showed: peanuts, tree nuts, sesame. Continue to avoid peanuts, tree nuts, sesame and shellfish. Okay to eat shrimp as before.  For mild symptoms you can take over the counter antihistamines such as Benadryl and monitor symptoms closely. If symptoms worsen or if you have severe symptoms including breathing issues, throat closure, significant swelling, whole body hives, severe diarrhea and vomiting, lightheadedness then inject epinephrine and seek immediate medical care afterwards. Schedule for crab challenge.  If interested we can schedule food challenge to crabs. You must be off antihistamines for 3-5 days before. Must be in good health and not ill. Plan on being in the office for 2-3 hours and must bring in the food you want to do the oral challenge for. You must call to schedule an appointment and specify it's for a food challenge.    Follow up in 6 months or sooner if needed.  It was a pleasure meeting you today!!

## 2022-01-26 MED ORDER — LORATADINE 10 MG PO TABS: 10.0000 mg | ORAL_TABLET | Freq: Every day | ORAL | 5 refills | Status: DC

## 2022-01-26 NOTE — Addendum Note (Signed)
Addended by: Tawnya Crook on: 01/26/2022 12:20 PM   Modules accepted: Orders

## 2022-07-31 NOTE — Progress Notes (Deleted)
FOLLOW UP Date of Service/Encounter:  07/31/22   Subjective:  [name] [name] (DOB: 1991/05/20) is a 31 y.o. female who [facility] in re-evaluation of the following: moderate persistent asthma, seasonal and perennial allergic rhinoconjunctivitis, atopic dermatitis on Dupixent, and allergy with anaphylaxis due to food  History obtained from: chart review and {Persons; PED relatives w/patient:19415::"patient"}.  For Review, LV was on 01/25/22  with Dr.Heitor Steinhoff seen for routine follow-up.Asthma was controlled on dupixent, using wixela 1 puff daily.  FEV1 96% at that visit. Eczema also controlled.   ---------------------------------------------- Pertinent History/Diagnostics:  Asthma:  moderate persistent  Allergic Rhinitis:  - SPT environmental panel-2020 skin testing showed: Positive to grass, tree, ragweed, weed, dust mites, dog, cat, mold, horse, mouse.   Food Allergy Avoids peanuts, tree nuts, sesame and shellfish except shrimp. Eats fish  -Past skin testing showed: peanuts, tree nuts, sesame.  - shellfish: breaks out into hives, asthma flares. but eats shrimp and tolerates, eats, finned fish  - peanuts and treenuts: has flare of asthma - sesame: had to use her inhaler Eczema:  controlled on Dupixent, started around 2021. Does home injections. ---------------------------  Today presents for follow-up. ***  Allergies as of 08/02/2022       Reactions   Peanut-containing Drug Products Anaphylaxis   REACTION: unspecified   Shrimp [shellfish Allergy] Hives   Latex Hives   Penicillins Hives        Medication List        Accurate as of July 31, 2022 10:13 PM. If you have any questions, ask your nurse or doctor.          albuterol 108 (90 Base) MCG/ACT inhaler Commonly known as: VENTOLIN HFA Inhale 1-2 puffs into the lungs every 6 (six) hours as needed for wheezing or shortness of breath.   doxycycline 100 MG  tablet Commonly known as: ADOXA Take 1 tablet (100 mg total) by mouth 2 (two) times daily.   Dupixent 300 MG/2ML prefilled syringe Generic drug: dupilumab Inject 300 mg into the skin every 14 (fourteen) days.   EPINEPHrine 0.3 mg/0.3 mL Soaj injection Commonly known as: EPI-PEN Inject 0.3 mg into the muscle as needed for anaphylaxis.   Eucrisa 2 % Oint Generic drug: Crisaborole 1 application 2 times daily on hands AS Needed to red itchy areas.   fluticasone 50 MCG/ACT nasal spray Commonly known as: FLONASE Place 2 sprays into both nostrils daily. Place 2 sprays in each nostril once a day as needed for stuffy nose   fluticasone-salmeterol 250-50 MCG/ACT Aepb Commonly known as: Wixela Inhub Inhale 1 puff into the lungs in the morning and at bedtime. Rinse mouth after each use.   hydrocortisone 2.5 % ointment Apply topically 2 (two) times daily. May use on the face up to 7 days at a time.   loratadine 10 MG tablet Commonly known as: CLARITIN Take 1 tablet (10 mg total) by mouth daily.   mometasone 0.1 % ointment Commonly known as: ELOCON Apply topically 2 (two) times daily. Below the neck up to 3 weeks a time. May use for flares.   triamcinolone ointment 0.1 % Commonly known as: KENALOG Apply 1 application. topically 2 (two) times daily as needed. Do not use on the face, neck, armpits or groin area. Do not use more than 3 weeks in a row.       Past Medical History:  Diagnosis Date   Allergic rhinitis    Allergy    Asthma  hosp 06/2009   Eczema    Kidney stone    right ER 09/17/09, 707/2012   Past Surgical History:  Procedure Laterality Date   dental extractions     TARSAL METATARSAL ARTHRODESIS Left 11/2021   foot   Otherwise, there have been no changes to her past medical history, surgical history, family history, or social history.  ROS: All others negative except as noted per HPI.   Objective:  There were no vitals taken for this visit. There is no  height or weight on file to calculate BMI. Physical Exam: General Appearance:  Alert, cooperative, no distress, appears stated age  Head:  Normocephalic, without obvious abnormality, atraumatic  Eyes:  Conjunctiva clear, EOM's intact  Nose: Nares normal, {Blank multiple:19196:a:"***","hypertrophic turbinates","normal mucosa","no visible anterior polyps","septum midline"}  Throat: Lips, tongue normal; teeth and gums normal, {Blank multiple:19196:a:"***","normal posterior oropharynx","tonsils 2+","tonsils 3+","no tonsillar exudate","+ cobblestoning"}  Neck: Supple, symmetrical  Lungs:   {Blank multiple:19196:a:"***","clear to auscultation bilaterally","end-expiratory wheezing","wheezing throughout"}, Respirations unlabored, {Blank multiple:19196:a:"***","no coughing","intermittent dry coughing"}  Heart:  {Blank multiple:19196:a:"***","regular rate and rhythm","no murmur"}, Appears well perfused  Extremities: No edema  Skin: Skin color, texture, turgor normal, no rashes or lesions on visualized portions of skin  Neurologic: No gross deficits   Reviewed: ***  Spirometry:  Tracings reviewed. Her effort: {Blank single:19197::"Good reproducible efforts.","It was hard to get consistent efforts and there is a question as to whether this reflects a maximal maneuver.","Poor effort, data can not be interpreted.","Variable effort-results affected.","decent for first attempt at spirometry."} FVC: ***L FEV1: ***L, ***% predicted FEV1/FVC ratio: ***% Interpretation: {Blank single:19197::"Spirometry consistent with mild obstructive disease","Spirometry consistent with moderate obstructive disease","Spirometry consistent with severe obstructive disease","Spirometry consistent with possible restrictive disease","Spirometry consistent with mixed obstructive and restrictive disease","Spirometry uninterpretable due to technique","Spirometry consistent with normal pattern","No overt abnormalities noted given today's  efforts"}.  Please see scanned spirometry results for details.  Skin Testing: {Blank single:19197::"Select foods","Environmental allergy panel","Environmental allergy panel and select foods","Food allergy panel","None","Deferred due to recent antihistamines use","deferred due to recent reaction"}. ***Adequate positive and negative controls Results discussed with patient/family.   {Blank single:19197::"Allergy testing results were read and interpreted by myself, documented by clinical staff."," "}  Assessment/Plan   ***  Sigurd Sos, MD  [facility] [place]

## 2022-08-02 ENCOUNTER — Ambulatory Visit: Payer: 59 | Admitting: Internal Medicine

## 2022-08-02 ENCOUNTER — Ambulatory Visit (INDEPENDENT_AMBULATORY_CARE_PROVIDER_SITE_OTHER): Payer: Self-pay | Admitting: Internal Medicine

## 2022-08-02 ENCOUNTER — Encounter: Payer: Self-pay | Admitting: Internal Medicine

## 2022-08-02 ENCOUNTER — Other Ambulatory Visit: Payer: Self-pay

## 2022-08-02 VITALS — BP 120/90 | HR 75 | Temp 98.5°F | Resp 18 | Ht 67.72 in | Wt 316.9 lb

## 2022-08-02 DIAGNOSIS — H1010 Acute atopic conjunctivitis, unspecified eye: Secondary | ICD-10-CM

## 2022-08-02 DIAGNOSIS — L2089 Other atopic dermatitis: Secondary | ICD-10-CM

## 2022-08-02 DIAGNOSIS — J4540 Moderate persistent asthma, uncomplicated: Secondary | ICD-10-CM

## 2022-08-02 DIAGNOSIS — H1013 Acute atopic conjunctivitis, bilateral: Secondary | ICD-10-CM

## 2022-08-02 DIAGNOSIS — T7800XD Anaphylactic reaction due to unspecified food, subsequent encounter: Secondary | ICD-10-CM

## 2022-08-02 DIAGNOSIS — J302 Other seasonal allergic rhinitis: Secondary | ICD-10-CM

## 2022-08-02 MED ORDER — LORATADINE 10 MG PO TABS: 10.0000 mg | ORAL_TABLET | Freq: Every day | ORAL | 5 refills | Status: DC

## 2022-08-02 MED ORDER — FLUTICASONE-SALMETEROL 250-50 MCG/ACT IN AEPB: 1.0000 | INHALATION_SPRAY | Freq: Two times a day (BID) | RESPIRATORY_TRACT | 5 refills | Status: DC

## 2022-08-02 MED ORDER — EUCRISA 2 % EX OINT: TOPICAL_OINTMENT | CUTANEOUS | 5 refills | Status: AC

## 2022-08-02 MED ORDER — MOMETASONE FUROATE 0.1 % EX OINT: TOPICAL_OINTMENT | Freq: Two times a day (BID) | CUTANEOUS | 3 refills | Status: DC

## 2022-08-02 MED ORDER — HYDROCORTISONE 2.5 % EX OINT: TOPICAL_OINTMENT | Freq: Two times a day (BID) | CUTANEOUS | 3 refills | Status: DC

## 2022-08-02 MED ORDER — FLUTICASONE PROPIONATE 50 MCG/ACT NA SUSP: 2.0000 | Freq: Every day | NASAL | 5 refills | Status: DC

## 2022-08-02 MED ORDER — TRIAMCINOLONE ACETONIDE 0.1 % EX OINT: 1.0000 | TOPICAL_OINTMENT | Freq: Two times a day (BID) | CUTANEOUS | 3 refills | Status: DC | PRN

## 2022-08-02 MED ORDER — ALBUTEROL SULFATE HFA 108 (90 BASE) MCG/ACT IN AERS: 1.0000 | INHALATION_SPRAY | Freq: Four times a day (QID) | RESPIRATORY_TRACT | 1 refills | Status: DC | PRN

## 2022-08-02 NOTE — Progress Notes (Signed)
FOLLOW UP Date of Service/Encounter:  08/02/22   Subjective:  [name] [name] (DOB: 04/17/1991) is a 31 y.o. female who [facility] in re-evaluation of the following: moderate persistent asthma, seasonal and perennial allergic rhinoconjunctivitis, atopic dermatitis on Dupixent, and allergy with anaphylaxis due to food  History obtained from: chart review and patient.  For Review, LV was on 01/25/22  with Dr.Yasenia Reedy seen for routine follow-up.  Today presents for follow-up. She has been doing great since her last visit until getting a cold a few weeks ago.  Following that cold, her asthma is flared.  However she has seen a significant improvement throughout the course of this week and feels like she is turning around.  She did enact her asthma action plan and has been doing Wixela 1 puff twice a day for the past few weeks which has helped. She did have to use her rescue inhaler last week on 2 separate occasions due to cough from viral URI symptoms.  However has not used any this week. She has not required any antibiotics or steroids since her last visit. She feels that her eczema is well-controlled. She is doing Dupixent at home for her eczema, and is tolerating her injections without any symptoms. She continues to avoid peanuts, tree nuts, sesame and shellfish exception without accidental exposures. ------------------------------------------- Pertinent History/Diagnostics:  Asthma: moderate persistent Allergic Rhinitis:  - SPT environmental panel-2020 skin testing showed: Positive to grass, tree, ragweed, weed, dust mites, dog, cat, mold, horse, mouse.   Food Allergy (peanuts, tree nuts, sesame and shellfish except shrimp)  -Past skin testing showed: peanuts, tree nuts, sesame.  - shellfish: breaks out into hives, asthma flares. but eats shrimp and tolerates, eats, finned fish - peanuts and treenuts: has flare of asthma  - sesame: had to use  her inhaler Eczema: controlled on Dupixent, started around 2021; does home dosing   Allergies as of 08/02/2022       Reactions   Peanut-containing Drug Products Anaphylaxis   REACTION: unspecified   Shrimp [shellfish Allergy] Hives   Latex Hives   Penicillins Hives        Medication List        Accurate as of August 02, 2022  1:34 PM. If you have any questions, ask your nurse or doctor.          albuterol 108 (90 Base) MCG/ACT inhaler Commonly known as: VENTOLIN HFA Inhale 1-2 puffs into the lungs every 6 (six) hours as needed for wheezing or shortness of breath.   doxycycline 100 MG tablet Commonly known as: ADOXA Take 1 tablet (100 mg total) by mouth 2 (two) times daily.   Dupixent 300 MG/2ML prefilled syringe Generic drug: dupilumab Inject 300 mg into the skin every 14 (fourteen) days.   EPINEPHrine 0.3 mg/0.3 mL Soaj injection Commonly known as: EPI-PEN Inject 0.3 mg into the muscle as needed for anaphylaxis.   Eucrisa 2 % Oint Generic drug: Crisaborole 1 application 2 times daily on hands AS Needed to red itchy areas.   fluticasone 50 MCG/ACT nasal spray Commonly known as: FLONASE Place 2 sprays into both nostrils daily. Place 2 sprays in each nostril once a day as needed for stuffy nose   fluticasone-salmeterol 250-50 MCG/ACT Aepb Commonly known as: Wixela Inhub Inhale 1 puff into the lungs in the morning and at bedtime. Rinse mouth after each use.   hydrocortisone 2.5 % ointment Apply topically 2 (two) times daily. May use on  the face up to 7 days at a time.   loratadine 10 MG tablet Commonly known as: CLARITIN Take 1 tablet (10 mg total) by mouth daily.   mometasone 0.1 % ointment Commonly known as: ELOCON Apply topically 2 (two) times daily. Below the neck up to 3 weeks a time. May use for flares.   triamcinolone ointment 0.1 % Commonly known as: KENALOG Apply 1 Application topically 2 (two) times daily as needed. Do not use on the face,  neck, armpits or groin area. What changed: additional instructions Changed by: Verlee Monte, MD       Past Medical History:  Diagnosis Date   Allergic rhinitis    Allergy    Asthma    hosp 06/2009   Eczema    Kidney stone    right ER 09/17/09, 707/2012   Past Surgical History:  Procedure Laterality Date   dental extractions     TARSAL METATARSAL ARTHRODESIS Left 11/2021   foot   Otherwise, there have been no changes to her past medical history, surgical history, family history, or social history.  ROS: All others negative except as noted per HPI.   Objective:  BP (!) 120/90   Pulse 75   Temp 98.5 F (36.9 C)   Resp 18   Ht 5' 7.72" (1.72 m)   Wt (!) 316 lb 14.4 oz (143.7 kg)   SpO2 97%   BMI 48.59 kg/m  Body mass index is 48.59 kg/m. Physical Exam: General Appearance:  Alert, cooperative, no distress, appears stated age  Head:  Normocephalic, without obvious abnormality, atraumatic  Eyes:  Conjunctiva clear, EOM's intact  Nose: Nares normal, hypertrophic turbinates, normal mucosa, and no visible anterior polyps  Throat: Lips, tongue normal; teeth and gums normal, normal posterior oropharynx  Neck: Supple, symmetrical  Lungs:   clear to auscultation bilaterally, Respirations unlabored, no coughing  Heart:  regular rate and rhythm and no murmur, Appears well perfused  Extremities: No edema  Skin: Skin color, texture, turgor normal, erythematous dry patches on knuckles and neck  Neurologic: No gross deficits   Spirometry:  Tracings reviewed. Her effort: It was hard to get consistent efforts and there is a question as to whether this reflects a maximal maneuver. FVC: 2.86L FEV1: 2.47L, 82% predicted FEV1/FVC ratio: 0.86 Interpretation: Spirometry consistent with normal pattern.  Please see scanned spirometry results for details.  Assessment/Plan   Moderate persistent asthma-controlled  Daily controller medication(s):  Wixela 250/50 1 puff daily to help  prevent cough and wheeze  For Asthma flares: increase to Wixela 250/50 1 puff twice daily to help prevent cough and wheeze May use albuterol rescue inhaler 2 puffs every 4 to 6 hours as needed for shortness of breath, chest tightness, coughing, and wheezing. May use albuterol rescue inhaler 2 puffs 5 to 15 minutes prior to strenuous physical activities. Monitor frequency of use.  Asthma control goals:  Full participation in all desired activities (may need albuterol before activity) Albuterol use two times or less a week on average (not counting use with activity) Cough interfering with sleep two times or less a month Oral steroids no more than once a year No hospitalizations  Other allergic rhinitis-controlled 2020 skin testing showed: Positive to grass, tree, ragweed, weed, dust mites, dog, cat, mold, horse, mouse.   Continue environmental control measures. May use over the counter antihistamines such as Zyrtec (cetirizine), Claritin (loratadine), Allegra (fexofenadine), or Xyzal (levocetirizine) daily as needed. May use Flonase 1 spray 1-2 times a day  as needed.   Other atopic dermatitis-controlled Continue dupixent injections every 2 weeks at home.  Continue proper skin care measures.   Hands-controlled Try to keep it dry at work. Wear a cotton glove under the rubber glove - get latex free. Use the following for the hands:  May use Eucrisa (crisaborole) 2% ointment twice a day on mild eczema flares on the face and body. This is a non-steroid ointment.  If it burns, place the medication in the refrigerator.  Apply a thin layer of moisturizer and then apply the Eucrisa on top of it. Medications: Only apply to affected areas that are "rough and red" Body:  May use triamcinolone 0.1% ointment twice a day as needed for eczema flares. Do not use on the face, neck, armpits or groin area. Do not use more than 3 weeks in a row.  For more than twice a day use the following: Aquaphor,  Vaseline, Cerave, Cetaphil, Eucerin, Vanicream.  Itching: Continue Claritin 10mg  in the morning as needed.  See diagram below for Dupixent injection sites   Adverse food reaction-stable Past skin testing showed: peanuts, tree nuts, sesame. Continue to avoid peanuts, tree nuts, sesame and shellfish. Okay to eat shrimp as before.  For mild symptoms you can take over the counter antihistamines such as Benadryl and monitor symptoms closely. If symptoms worsen or if you have severe symptoms including breathing issues, throat closure, significant swelling, whole body hives, severe diarrhea and vomiting, lightheadedness then inject epinephrine and seek immediate medical care afterwards. Schedule for crab challenge.  If interested we can schedule food challenge to crabs. You must be off antihistamines for 3-5 days before. Must be in good health and not ill. Plan on being in the office for 2-3 hours and must bring in the food you want to do the oral challenge for. You must call to schedule an appointment and specify it's for a food challenge.    Follow up in 6 months or sooner if needed.  It was a pleasure seeing you again today!!  , MD  [facility]

## 2022-08-02 NOTE — Patient Instructions (Addendum)
Moderate persistent asthma-controlled  Daily controller medication(s):  Wixela 250/50 1 puff daily to help prevent cough and wheeze  For Asthma flares: increase to Wixela 250/50 1 puff twice daily to help prevent cough and wheeze May use albuterol rescue inhaler 2 puffs every 4 to 6 hours as needed for shortness of breath, chest tightness, coughing, and wheezing. May use albuterol rescue inhaler 2 puffs 5 to 15 minutes prior to strenuous physical activities. Monitor frequency of use.  Asthma control goals:  Full participation in all desired activities (may need albuterol before activity) Albuterol use two times or less a week on average (not counting use with activity) Cough interfering with sleep two times or less a month Oral steroids no more than once a year No hospitalizations  Other allergic rhinitis-controlled 2020 skin testing showed: Positive to grass, tree, ragweed, weed, dust mites, dog, cat, mold, horse, mouse.   Continue environmental control measures. May use over the counter antihistamines such as Zyrtec (cetirizine), Claritin (loratadine), Allegra (fexofenadine), or Xyzal (levocetirizine) daily as needed. May use Flonase 1 spray 1-2 times a day as needed.   Other atopic dermatitis-controlled Continue dupixent injections every 2 weeks at home.  Continue proper skin care measures.   Hands-controlled Try to keep it dry at work. Wear a cotton glove under the rubber glove - get latex free. Use the following for the hands:  May use Eucrisa (crisaborole) 2% ointment twice a day on mild eczema flares on the face and body. This is a non-steroid ointment.  If it burns, place the medication in the refrigerator.  Apply a thin layer of moisturizer and then apply the Eucrisa on top of it. Medications: Only apply to affected areas that are "rough and red" Body:  May use triamcinolone 0.1% ointment twice a day as needed for eczema flares. Do not use on the face, neck, armpits or groin  area. Do not use more than 3 weeks in a row.  For more than twice a day use the following: Aquaphor, Vaseline, Cerave, Cetaphil, Eucerin, Vanicream.  Itching: Continue Claritin 10mg  in the morning as needed.  See diagram below for Dupixent injection sites   Adverse food reaction- Past skin testing showed: peanuts, tree nuts, sesame. Continue to avoid peanuts, tree nuts, sesame and shellfish. Okay to eat shrimp as before.  For mild symptoms you can take over the counter antihistamines such as Benadryl and monitor symptoms closely. If symptoms worsen or if you have severe symptoms including breathing issues, throat closure, significant swelling, whole body hives, severe diarrhea and vomiting, lightheadedness then inject epinephrine and seek immediate medical care afterwards. Schedule for crab challenge.  If interested we can schedule food challenge to crabs. You must be off antihistamines for 3-5 days before. Must be in good health and not ill. Plan on being in the office for 2-3 hours and must bring in the food you want to do the oral challenge for. You must call to schedule an appointment and specify it's for a food challenge.    Follow up in 6 months or sooner if needed.  It was a pleasure seeing you again today!!

## 2022-08-14 ENCOUNTER — Other Ambulatory Visit: Payer: Self-pay | Admitting: *Deleted

## 2022-08-14 MED ORDER — DUPIXENT 300 MG/2ML ~~LOC~~ SOSY: 300.0000 mg | PREFILLED_SYRINGE | SUBCUTANEOUS | 11 refills | Status: DC

## 2022-11-06 IMAGING — DX DG FOOT COMPLETE 3+V*L*
3 series · 3 of 3 positions shown · non-contrast
Comparison: None.

CLINICAL DATA: Left foot injury after fall.

EXAM:
LEFT FOOT - COMPLETE 3+ VIEW

[foot ap]
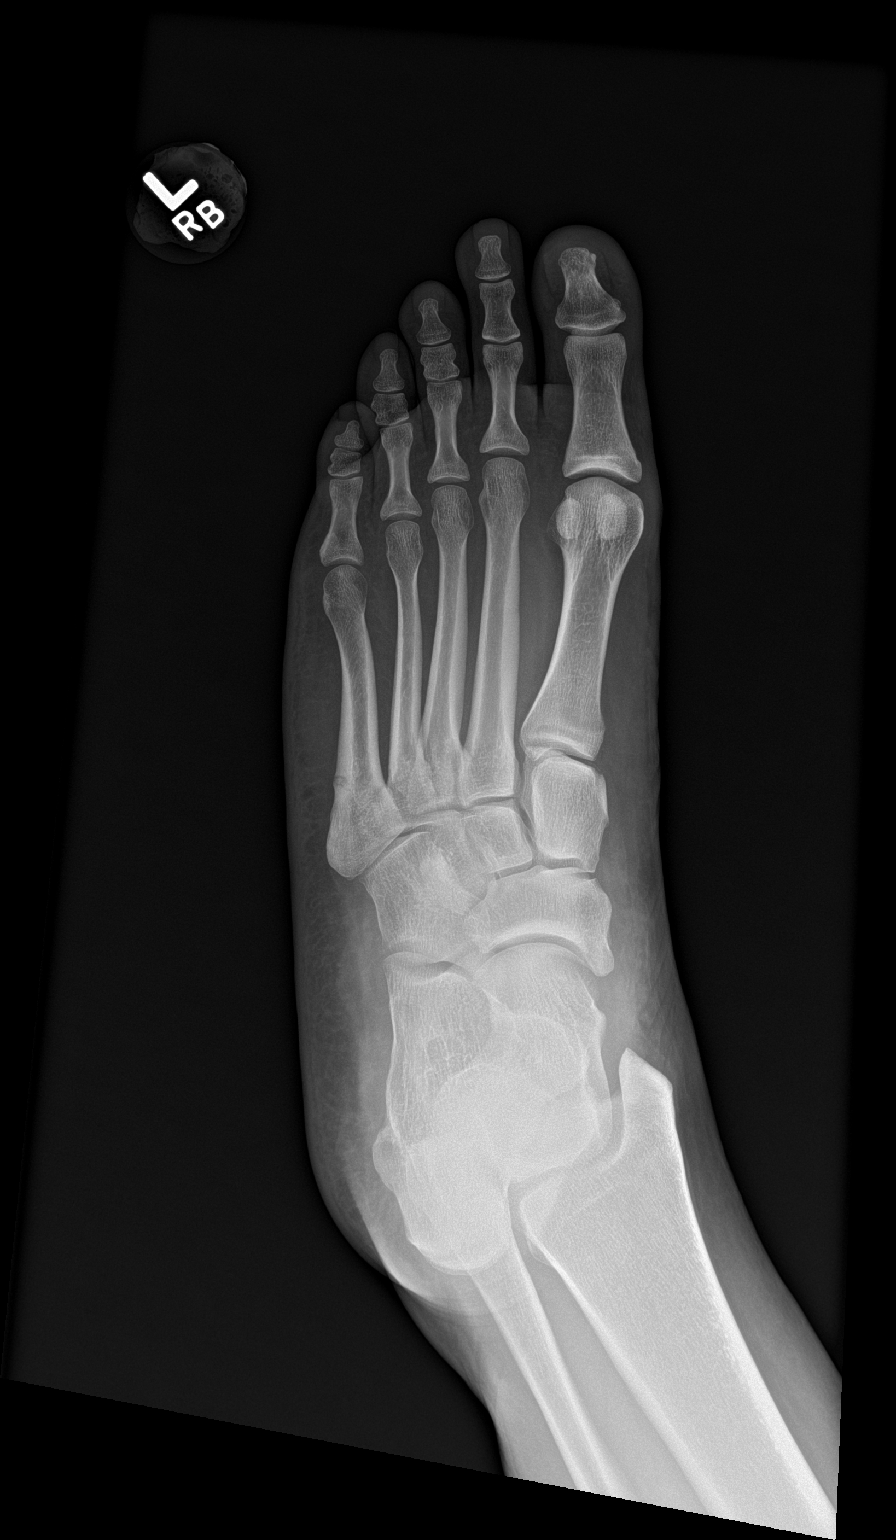

[foot obl]
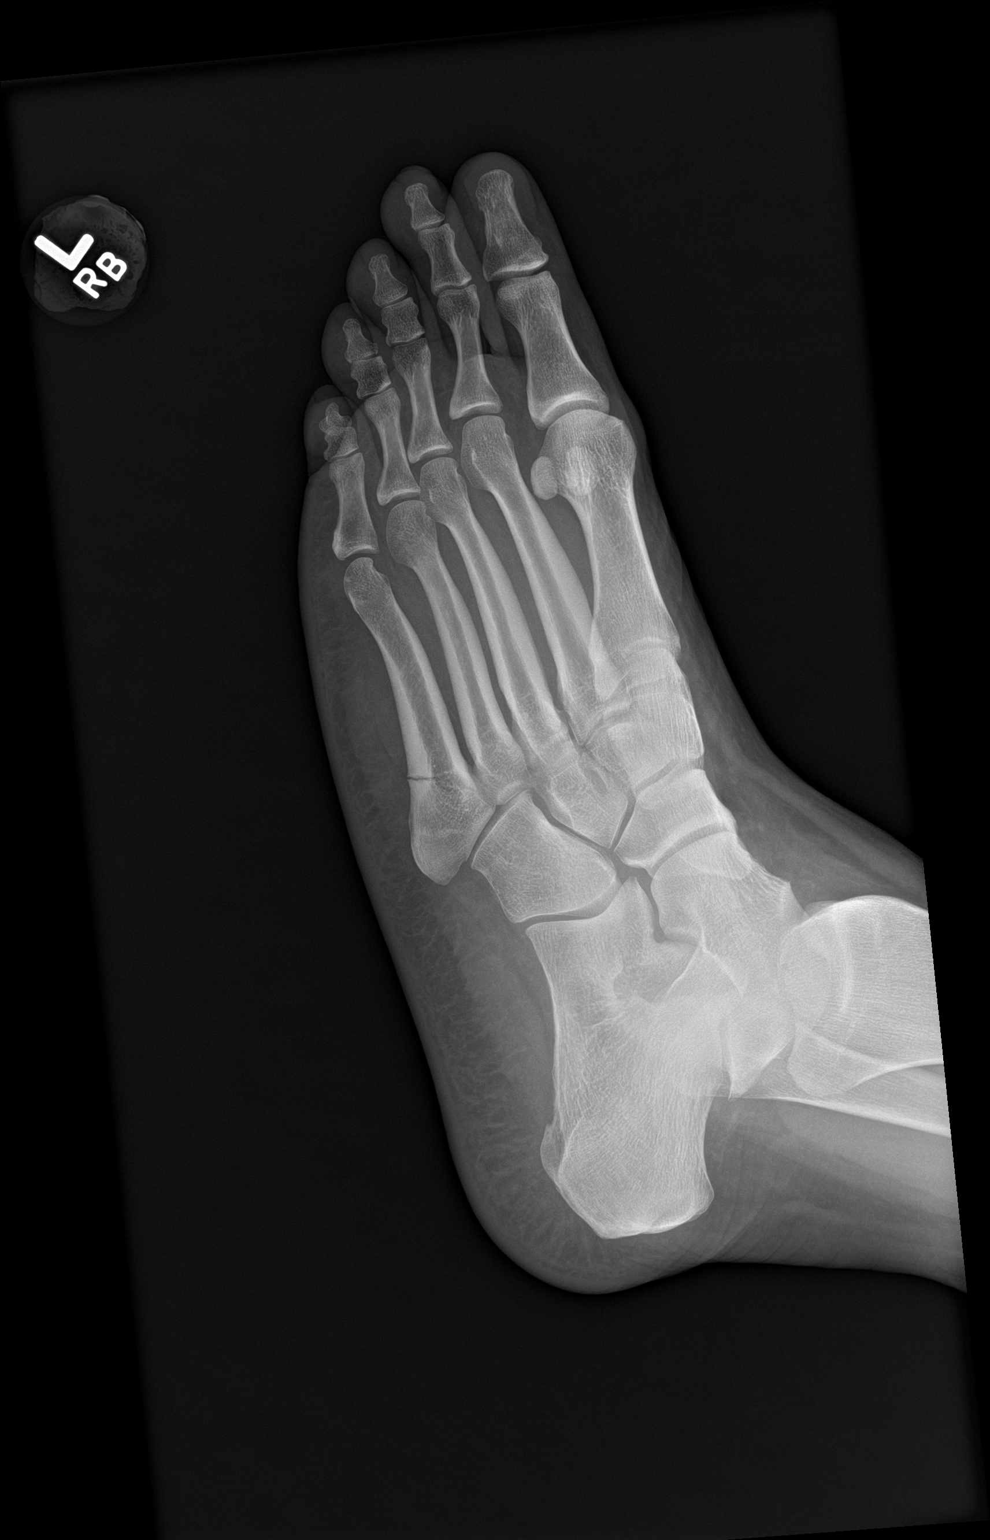

[foot lat]
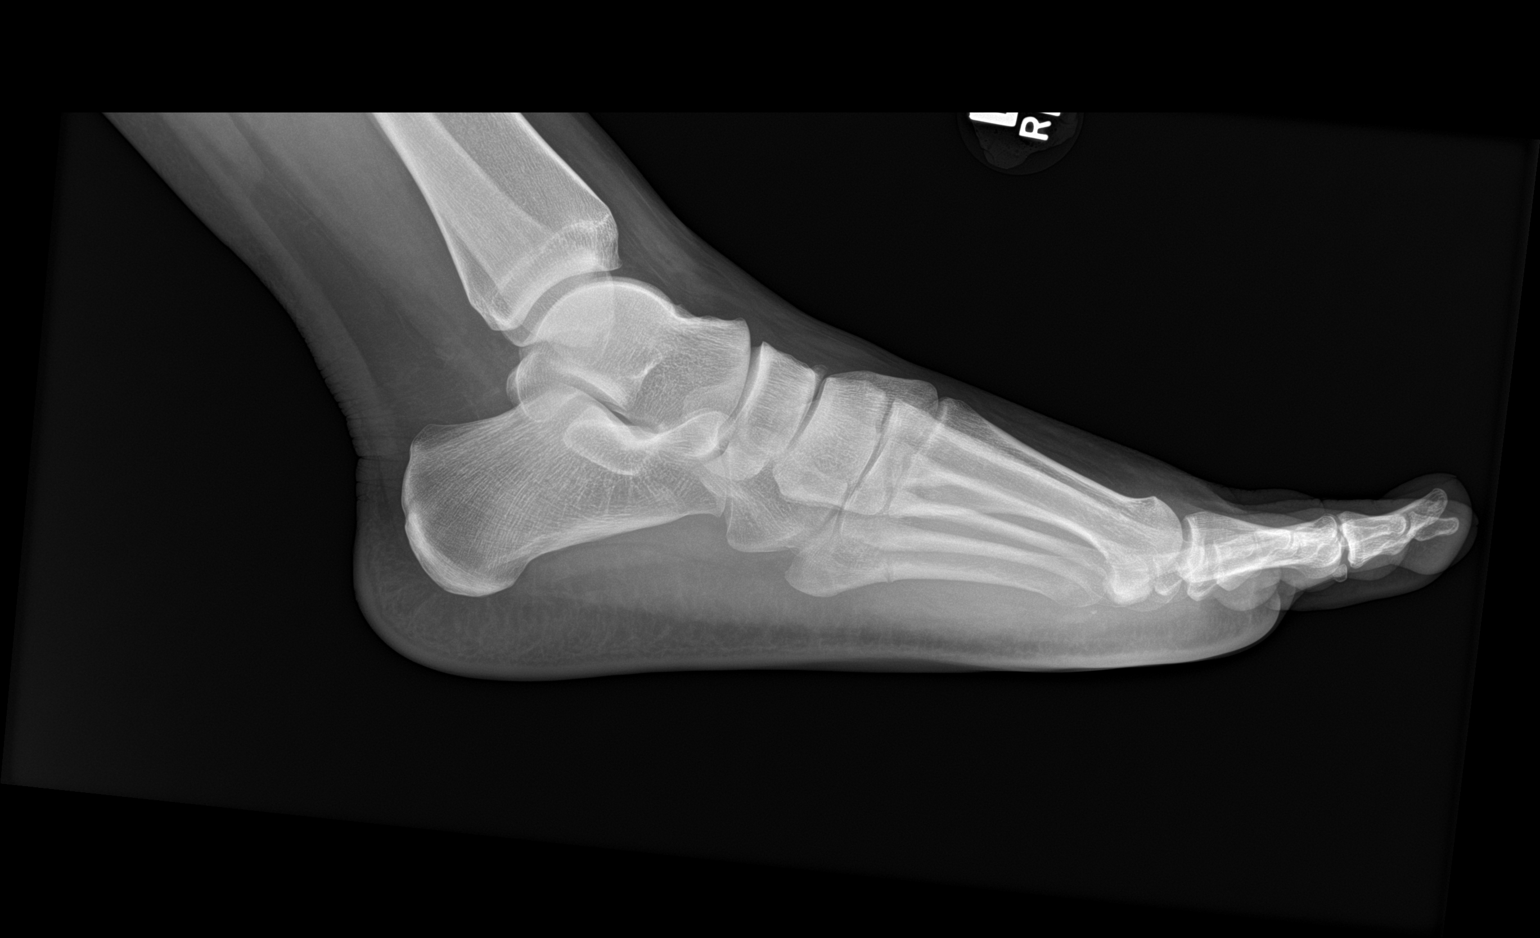

[3 of 3 positions shown; findings below may reference images not displayed]

FINDINGS: There is an acute nondisplaced transverse fracture through the
proximal aspect of the fourth metatarsal. There is no dislocation.
Soft tissues are within normal limits.
IMPRESSION: 1. Acute fracture proximal fifth metatarsal.

## 2023-02-05 NOTE — Progress Notes (Unsigned)
FOLLOW UP Date of Service/Encounter:  02/07/23   Subjective:  [name] [name] (DOB: 18-Nov-1990) is a 32 y.o. female who [facility] in re-evaluation of the following: moderate persistent asthma, seasonal and perennial allergic rhinoconjunctivitis, atopic dermatitis on Dupixent, and allergy with anaphylaxis due to food  History obtained from: chart review and patient.  For Review, LV was on 08/02/22  with Dr.Toryn Mcclinton seen for routine follow-up. See below for summary of history and diagnostics.  Therapeutic plans/changes recommended: FEV1 82%, reported a mild flare of asthma with a recent cold that was well-managed using asthma action plan, eczema controlled on dupixent. Avoiding peanuts, tree nuts, and sesame.  ----------------------------------------------------- Pertinent History/Diagnostics:  Asthma: moderate persistent Allergic Rhinitis:  - SPT environmental panel-2020 skin testing showed: Positive to grass, tree, ragweed, weed, dust mites, dog, cat, mold, horse, mouse.   Food Allergy (peanuts, tree nuts, and shellfish except shrimp)  -Past skin testing showed: peanuts, tree nuts, sesame.  - shellfish: breaks out into hives, asthma flares. but eats shrimp and tolerates, eats, finned fish - peanuts and treenuts: has flare of asthma  - sesame: had to use her inhaler, now eating and tolerating as of June 2024. Eczema: controlled on Dupixent, started around 2021; does home dosing Penicillin allergy: told by her mother that she breaks out into hives. Avoids since childhood. --------------------------------------------------- Today presents for follow-up. Asthma: She continues on her Wixela 250/50 1 puff 1-2 times daily. No steroids or antibiotics since last visit. Using rescue once every couple of months. Dupixent injections going well.  No adverse events with this. Eczema: Doing well on Dupixent. She has not needed any topical  steroids. Allergic rhinitis: She is taking xyzal daily which helps control her allergies. Not needing nasal sprays. Food allergies: Continues to avoid peanuts and tree nuts. Continues to eat shrimp.  She is now eating sesame. Would like to do crab challenge.  Allergies as of 02/07/2023       Reactions   Peanut-containing Drug Products Anaphylaxis   REACTION: unspecified   Shrimp [shellfish Allergy] Hives   Tolerates shrimp, avoids all other shellfish.   Other    TREE NUTS   Latex Hives   Penicillins Hives        Medication List        Accurate as of February 07, 2023  9:22 AM. If you have any questions, ask your nurse or doctor.          albuterol 108 (90 Base) MCG/ACT inhaler Commonly known as: VENTOLIN HFA Inhale 1-2 puffs into the lungs every 6 (six) hours as needed for wheezing or shortness of breath.   doxycycline 100 MG tablet Commonly known as: ADOXA Take 1 tablet (100 mg total) by mouth 2 (two) times daily.   Dupixent 300 MG/2ML prefilled syringe Generic drug: dupilumab Inject 300 mg into the skin every 14 (fourteen) days.   EPINEPHrine 0.3 mg/0.3 mL Soaj injection Commonly known as: EPI-PEN Inject 0.3 mg into the muscle as needed for anaphylaxis.   Eucrisa 2 % Oint Generic drug: Crisaborole 1 application 2 times daily on hands AS Needed to red itchy areas.   fluticasone 50 MCG/ACT nasal spray Commonly known as: FLONASE Place 2 sprays into both nostrils daily. Place 2 sprays in each nostril once a day as needed for stuffy nose   fluticasone-salmeterol 250-50 MCG/ACT Aepb Commonly known as: Wixela Inhub Inhale 1 puff into the lungs in the morning and at bedtime. Rinse mouth after  each use.   hydrocortisone 2.5 % ointment Apply topically 2 (two) times daily. May use on the face up to 7 days at a time.   loratadine 10 MG tablet Commonly known as: CLARITIN Take 1 tablet (10 mg total) by mouth daily.   mometasone 0.1 % ointment Commonly known as:  ELOCON Apply topically 2 (two) times daily. Below the neck up to 3 weeks a time. May use for flares.   triamcinolone ointment 0.1 % Commonly known as: KENALOG Apply 1 Application topically 2 (two) times daily as needed. Do not use on the face, neck, armpits or groin area.       Past Medical History:  Diagnosis Date   Allergic rhinitis    Allergy    Asthma    hosp 06/2009   Eczema    Kidney stone    right ER 09/17/09, 707/2012   Past Surgical History:  Procedure Laterality Date   dental extractions     TARSAL METATARSAL ARTHRODESIS Left 11/2021   foot   Otherwise, there have been no changes to her past medical history, surgical history, family history, or social history.  ROS: All others negative except as noted per HPI.   Objective:  Pulse 65   Temp 98.2 F (36.8 C)   Resp 18   Ht 5\' 7"  (1.702 m)   Wt (!) 310 lb 9.6 oz (140.9 kg)   SpO2 96%   BMI 48.65 kg/m  Body mass index is 48.65 kg/m. Physical Exam: General Appearance:  Alert, cooperative, no distress, appears stated age  Head:  Normocephalic, without obvious abnormality, atraumatic  Eyes:  Conjunctiva clear, EOM's intact  Nose: Nares normal, hypertrophic turbinates and normal mucosa  Throat: Lips, tongue normal; teeth and gums normal, normal posterior oropharynx  Neck: Supple, symmetrical  Lungs:   clear to auscultation bilaterally, Respirations unlabored, no coughing  Heart:  regular rate and rhythm and no murmur, Appears well perfused  Extremities: No edema  Skin: Skin color, texture, turgor normal and no rashes or lesions on visualized portions of skin  Neurologic: No gross deficits    Spirometry:  Tracings reviewed. Her effort: Good reproducible efforts. FVC: 3.57L FEV1: 2.89L, 95% predicted FEV1/FVC ratio: 0.81 Interpretation: Spirometry consistent with normal pattern.  Please see scanned spirometry results for details.  Assessment/Plan   Moderate persistent asthma-controlled  Daily  controller medication(s):  Wixela 250/50 1 puff daily to help prevent cough and wheeze  For Asthma flares: increase to Wixela 250/50 1 puff twice daily to help prevent cough and wheeze May use albuterol rescue inhaler 2 puffs every 4 to 6 hours as needed for shortness of breath, chest tightness, coughing, and wheezing. May use albuterol rescue inhaler 2 puffs 5 to 15 minutes prior to strenuous physical activities. Monitor frequency of use.  Asthma control goals:  Full participation in all desired activities (may need albuterol before activity) Albuterol use two times or less a week on average (not counting use with activity) Cough interfering with sleep two times or less a month Oral steroids no more than once a year No hospitalizations  Other allergic rhinitis-controlled 2020 skin testing showed: Positive to grass, tree, ragweed, weed, dust mites, dog, cat, mold, horse, mouse.   Continue environmental control measures. May use over the counter antihistamines such as Zyrtec (cetirizine), Claritin (loratadine), Allegra (fexofenadine), or Xyzal (levocetirizine) daily as needed. May use Flonase 1 spray 1-2 times a day as needed.   Other atopic dermatitis-controlled Continue dupixent injections every 2 weeks at home.  Continue proper skin care measures.   Hand eczema-controlled Try to keep it dry at work. Wear a cotton glove under the rubber glove - get latex free. Use the following for the hands:  May use Eucrisa (crisaborole) 2% ointment twice a day on mild eczema flares on the face and body. This is a non-steroid ointment.  If it burns, place the medication in the refrigerator.  Apply a thin layer of moisturizer and then apply the Eucrisa on top of it. Medications: Only apply to affected areas that are "rough and red" Body:  May use triamcinolone 0.1% ointment twice a day as needed for eczema flares. Do not use on the face, neck, armpits or groin area. Do not use more than 3 weeks in a  row.  For more than twice a day use the following: Aquaphor, Vaseline, Cerave, Cetaphil, Eucerin, Vanicream.  Itching: Continue Claritin 10mg  in the morning as needed.  See diagram below for Dupixent injection sites   Adverse food reaction-stable, now tolerating sesame seeds Past skin testing showed: peanuts, tree nuts, sesame. Eating and tolerating sesame and shrimp. Continue to avoid peanuts, tree nuts, shellfish. Okay to eat shrimp as before.  For mild symptoms you can take over the counter antihistamines such as Benadryl and monitor symptoms closely. If symptoms worsen or if you have severe symptoms including breathing issues, throat closure, significant swelling, whole body hives, severe diarrhea and vomiting, lightheadedness then inject epinephrine and seek immediate medical care afterwards. Schedule for crab challenge.  If interested we can schedule food challenge to crabs. You must be off antihistamines for 3-5 days before. Must be in good health and not ill. Plan on being in the office for 2-3 hours and must bring in the food you want to do the oral challenge for. You must call to schedule an appointment and specify it's for a food challenge.   H/O Penicillin allergy: - please schedule follow-up appt at your convenience for penicillin testing followed by graded oral challenge if indicated - please refrain from taking any antihistamines at least 3 days prior to this appointment  - around 80% of individuals outgrow this allergy in ~ 10 years and carrying it as a diagnosis can prevent you from getting proper therapy if needed    Follow up in 6 months or sooner if needed.  It was a pleasure seeing you again today!! Thank you for letting me participate in your care.  [name], MD  [facility]

## 2023-02-07 ENCOUNTER — Encounter: Payer: Self-pay | Admitting: Internal Medicine

## 2023-02-07 ENCOUNTER — Ambulatory Visit (INDEPENDENT_AMBULATORY_CARE_PROVIDER_SITE_OTHER): Payer: BC Managed Care – PPO | Admitting: Internal Medicine

## 2023-02-07 ENCOUNTER — Other Ambulatory Visit: Payer: Self-pay

## 2023-02-07 VITALS — HR 65 | Temp 98.2°F | Resp 18 | Ht 67.0 in | Wt 310.6 lb

## 2023-02-07 DIAGNOSIS — J3089 Other allergic rhinitis: Secondary | ICD-10-CM | POA: Diagnosis not present

## 2023-02-07 DIAGNOSIS — Z880 Allergy status to penicillin: Secondary | ICD-10-CM

## 2023-02-07 DIAGNOSIS — H1013 Acute atopic conjunctivitis, bilateral: Secondary | ICD-10-CM

## 2023-02-07 DIAGNOSIS — L2089 Other atopic dermatitis: Secondary | ICD-10-CM | POA: Diagnosis not present

## 2023-02-07 DIAGNOSIS — J4540 Moderate persistent asthma, uncomplicated: Secondary | ICD-10-CM

## 2023-02-07 MED ORDER — EPINEPHRINE 0.3 MG/0.3ML IJ SOAJ: 0.3000 mg | INTRAMUSCULAR | 1 refills | Status: DC | PRN

## 2023-02-07 MED ORDER — FLUTICASONE PROPIONATE 50 MCG/ACT NA SUSP: 2.0000 | Freq: Every day | NASAL | 5 refills | Status: DC

## 2023-02-07 MED ORDER — LORATADINE 10 MG PO TABS: 10.0000 mg | ORAL_TABLET | Freq: Every day | ORAL | 5 refills | Status: DC

## 2023-02-07 MED ORDER — ALBUTEROL SULFATE HFA 108 (90 BASE) MCG/ACT IN AERS: 1.0000 | INHALATION_SPRAY | Freq: Four times a day (QID) | RESPIRATORY_TRACT | 1 refills | Status: DC | PRN

## 2023-02-07 MED ORDER — FLUTICASONE-SALMETEROL 250-50 MCG/ACT IN AEPB: 1.0000 | INHALATION_SPRAY | Freq: Two times a day (BID) | RESPIRATORY_TRACT | 5 refills | Status: DC

## 2023-02-07 NOTE — Patient Instructions (Addendum)
Moderate persistent asthma-controlled  Daily controller medication(s):  Wixela 250/50 1 puff daily to help prevent cough and wheeze  For Asthma flares: increase to Wixela 250/50 1 puff twice daily to help prevent cough and wheeze May use albuterol rescue inhaler 2 puffs every 4 to 6 hours as needed for shortness of breath, chest tightness, coughing, and wheezing. May use albuterol rescue inhaler 2 puffs 5 to 15 minutes prior to strenuous physical activities. Monitor frequency of use.  Asthma control goals:  Full participation in all desired activities (may need albuterol before activity) Albuterol use two times or less a week on average (not counting use with activity) Cough interfering with sleep two times or less a month Oral steroids no more than once a year No hospitalizations  Other allergic rhinitis-controlled 2020 skin testing showed: Positive to grass, tree, ragweed, weed, dust mites, dog, cat, mold, horse, mouse.   Continue environmental control measures. May use over the counter antihistamines such as Zyrtec (cetirizine), Claritin (loratadine), Allegra (fexofenadine), or Xyzal (levocetirizine) daily as needed. May use Flonase 1 spray 1-2 times a day as needed.   Other atopic dermatitis-controlled Continue dupixent injections every 2 weeks at home.  Continue proper skin care measures.   Hands-controlled Try to keep it dry at work. Wear a cotton glove under the rubber glove - get latex free. Use the following for the hands:  May use Eucrisa (crisaborole) 2% ointment twice a day on mild eczema flares on the face and body. This is a non-steroid ointment.  If it burns, place the medication in the refrigerator.  Apply a thin layer of moisturizer and then apply the Eucrisa on top of it. Medications: Only apply to affected areas that are "rough and red" Body:  May use triamcinolone 0.1% ointment twice a day as needed for eczema flares. Do not use on the face, neck, armpits or groin  area. Do not use more than 3 weeks in a row.  For more than twice a day use the following: Aquaphor, Vaseline, Cerave, Cetaphil, Eucerin, Vanicream.  Itching: Continue Claritin 10mg  in the morning as needed.  See diagram below for Dupixent injection sites   Adverse food reaction- Past skin testing showed: peanuts, tree nuts, sesame. Eating and tolerating sesame and shrimp. Continue to avoid peanuts, tree nuts, shellfish. Okay to eat shrimp as before.  For mild symptoms you can take over the counter antihistamines such as Benadryl and monitor symptoms closely. If symptoms worsen or if you have severe symptoms including breathing issues, throat closure, significant swelling, whole body hives, severe diarrhea and vomiting, lightheadedness then inject epinephrine and seek immediate medical care afterwards. Schedule for crab challenge.  If interested we can schedule food challenge to crabs. You must be off antihistamines for 3-5 days before. Must be in good health and not ill. Plan on being in the office for 2-3 hours and must bring in the food you want to do the oral challenge for. You must call to schedule an appointment and specify it's for a food challenge.   H/O Penicillin allergy: - please schedule follow-up appt at your convenience for penicillin testing followed by graded oral challenge if indicated - please refrain from taking any antihistamines at least 3 days prior to this appointment  - around 80% of individuals outgrow this allergy in ~ 10 years and carrying it as a diagnosis can prevent you from getting proper therapy if needed    Follow up in 6 months or sooner if needed.  It was  a pleasure seeing you again today!! Thank you for letting me participate in your care.

## 2023-02-15 ENCOUNTER — Ambulatory Visit: Payer: BC Managed Care – PPO | Admitting: Family Medicine

## 2023-02-15 ENCOUNTER — Encounter: Payer: Self-pay | Admitting: Family Medicine

## 2023-02-15 VITALS — BP 150/70 | HR 75 | Temp 98.2°F | Ht 68.11 in | Wt 310.0 lb

## 2023-02-15 DIAGNOSIS — R0681 Apnea, not elsewhere classified: Secondary | ICD-10-CM | POA: Diagnosis not present

## 2023-02-15 DIAGNOSIS — E559 Vitamin D deficiency, unspecified: Secondary | ICD-10-CM | POA: Diagnosis not present

## 2023-02-15 DIAGNOSIS — I10 Essential (primary) hypertension: Secondary | ICD-10-CM | POA: Diagnosis not present

## 2023-02-15 MED ORDER — ZEPBOUND 7.5 MG/0.5ML ~~LOC~~ SOAJ
7.5000 mg | SUBCUTANEOUS | 0 refills | Status: DC
Start: 2023-02-15 — End: 2023-03-07

## 2023-02-15 MED ORDER — HYDROCHLOROTHIAZIDE 25 MG PO TABS
25.0000 mg | ORAL_TABLET | Freq: Every day | ORAL | 1 refills | Status: DC
Start: 2023-02-15 — End: 2023-03-13

## 2023-02-15 MED ORDER — ZEPBOUND 2.5 MG/0.5ML ~~LOC~~ SOAJ
2.5000 mg | SUBCUTANEOUS | 0 refills | Status: DC
Start: 2023-02-15 — End: 2023-03-07

## 2023-02-15 MED ORDER — ZEPBOUND 5 MG/0.5ML ~~LOC~~ SOAJ
5.0000 mg | SUBCUTANEOUS | 0 refills | Status: DC
Start: 2023-02-15 — End: 2023-03-07

## 2023-02-15 NOTE — Progress Notes (Signed)
New Patient Office Visit  Subjective    Patient ID: [name] [name], female    DOB: 02-11-1991  Age: 32 y.o. 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 Patient is reporting that she is feeling constantly tired, patient reports that she does snore excessively and her partner   Patient reports she has always been "bigger'. States that it has gotten worse in the last 1 1/2 years. States that she learned how to cook and in the last year and she felt like she had some depression symptoms, those have gotten better, some trouble with alcohol, family losses. Patient reports that when she was around 200 pounds she felt the happiest. States that as a teenager she played a lot of sports, basketball softball.   Patient states she has tried to lose weight many times in the past. States she did the keto diet for 6 months, was also working out, states that she lost about 20 pounds at that time, however she could not. Has tried the [employer] for 2 weeks but this was not sustainable. Patient reports she walks daily, job is physically strenuous and so gets a lot of exercise.   Comorbid conditions including likely OSA and HTN.   Current Outpatient Medications  Medication Instructions   albuterol (VENTOLIN HFA) 108 (90 Base) MCG/ACT inhaler 1-2 puffs, Inhalation, Every 6 hours PRN   Crisaborole (EUCRISA) 2 % OINT 1 application 2 times daily on hands AS Needed to red itchy areas.   Dupixent 300 mg, Subcutaneous, Every 14 days   EPINEPHrine (EPI-PEN) 0.3 mg, Intramuscular, As needed   fluticasone (FLONASE) 50 MCG/ACT nasal spray 2 sprays, Each Nare, Daily, Place 2 sprays in each nostril once a day as needed for stuffy nose   fluticasone-salmeterol (WIXELA INHUB) 250-50 MCG/ACT AEPB 1 puff,  Inhalation, 2 times daily, Rinse mouth after each use.   hydrochlorothiazide (HYDRODIURIL) 25 mg, Oral, Daily   hydrocortisone 2.5 % ointment Topical, 2 times daily, May use on the face up to 7 days at a time.   loratadine (CLARITIN) 10 mg, Oral, Daily   mometasone (ELOCON) 0.1 % ointment Topical, 2 times daily, Below the neck up to 3 weeks a time. May use for flares.   triamcinolone ointment (KENALOG) 0.1 % 1 Application, Topical, 2 times daily PRN, Do not use on the face, neck, armpits or groin area.   Zepbound 2.5 mg, Subcutaneous, Weekly, Body mass index is 46.98 kg/m   Zepbound 5 mg, Subcutaneous, Weekly   Zepbound 7.5 mg, Subcutaneous, Weekly    Past Medical History:  Diagnosis Date   Allergic rhinitis    Allergy    Asthma    hosp 06/2009   Chicken pox    Eczema    Kidney stone    right ER 09/17/09, 707/2012    Past Surgical History:  Procedure Laterality Date   dental extractions     TARSAL METATARSAL ARTHRODESIS Left 11/2021   foot    Family History  Problem Relation Age of Onset   Asthma Mother    Eczema Mother    Drug abuse Maternal Grandmother    Arthritis Maternal Grandmother    Alcohol abuse Maternal Grandfather    Drug abuse Maternal Grandfather    Early death Paternal Grandmother  Arthritis Paternal Grandmother    Asthma Other        uncle   Eczema Other        uncle    Social History   Socioeconomic History   Marital status: Single    Spouse name: Not on file   Number of children: Not on file   Years of education: Not on file   Highest education level: Not on file  Occupational History   Not on file  Tobacco Use   Smoking status: Former    Types: Cigarettes    Quit date: 2019    Years since quitting: 5.4   Smokeless tobacco: Never   Tobacco comments:    Stopped 2 months ago   Vaping Use   Vaping Use: Never used  Substance and Sexual Activity   Alcohol use: Yes    Comment: wine, liquor,beer on weekends   Drug use: No   Sexual  activity: Yes  Other Topics Concern   Not on file  Social History Narrative   Not on file   Social Determinants of Health   Financial Resource Strain: Not on file  Food Insecurity: Not on file  Transportation Needs: Not on file  Physical Activity: Not on file  Stress: Not on file  Social Connections: Not on file  Intimate Partner Violence: Not on file    Review of Systems  Constitutional:  Negative for malaise/fatigue.  HENT:  Negative for congestion.   Cardiovascular:  Negative for chest pain and palpitations.  Gastrointestinal:  Negative for heartburn.  Psychiatric/Behavioral:  Negative for depression.   All other systems reviewed and are negative.       Objective    BP (!) 150/70 (BP Location: Left Arm, Patient Position: Sitting, Cuff Size: Normal)   Pulse 75   Temp 98.2 F (36.8 C) (Oral)   Ht 5' 8.11" (1.73 m)   Wt (!) 310 lb (140.6 kg)   SpO2 98%   BMI 46.98 kg/m   Physical Exam Vitals reviewed.  Constitutional:      Appearance: Normal appearance. She is well-groomed. She is morbidly obese.  Eyes:     Conjunctiva/sclera: Conjunctivae normal.  Neck:     Thyroid: No thyromegaly.  Cardiovascular:     Rate and Rhythm: Normal rate and regular rhythm.     Pulses: Normal pulses.     Heart sounds: S1 normal and S2 normal.  Pulmonary:     Effort: Pulmonary effort is normal.     Breath sounds: Normal breath sounds and air entry.  Abdominal:     General: Bowel sounds are normal.  Musculoskeletal:     Right lower leg: No edema.     Left lower leg: No edema.  Neurological:     Mental Status: She is alert and oriented to person, place, and time. Mental status is at baseline.     Gait: Gait is intact.  Psychiatric:        Mood and Affect: Mood and affect normal.        Speech: Speech normal.        Behavior: Behavior normal.        Judgment: Judgment normal.         Assessment & Plan:  Witnessed episode of apnea Assessment & Plan: Most likely OSA  which is a comorbid condition to her obesity. I will place an order to pulmonary for evaluation. We spent the majority of the visit today discussed weight loss/ diet and exercise, see below.  Orders: -     Ambulatory referral to Pulmonology  Morbid obesity [facility]) Assessment & Plan: I have had an extensive 30 minute conversation today with the patient about healthy eating habits, exercise, calorie and carb goals for sustainable and successful weight loss. I gave the patient caloric and protein daily intake values as well as described the importance of increasing fiber and water intake. I discussed weight loss medications that could be used in the treatment of this patient. Handouts on low carb eating were given to the patient.   The goals I gave the patient are listed below. Patient will continue to be monitored regularly with weight checks and follow up.   Patient has 2 comorbid conditions as as result of her obesity -- HTN and likely OSA.   Patient has tried multiple diets with exercise for several months in the past with only moderate weight loss.   Orders: -     Zepbound; Inject 2.5 mg into the skin once a week. Body mass index is 46.98 kg/m  Dispense: 2 mL; Refill: 0 -     Zepbound; Inject 5 mg into the skin once a week.  Dispense: 2 mL; Refill: 0 -     Zepbound; Inject 7.5 mg into the skin once a week.  Dispense: 2 mL; Refill: 0 -     Lipid panel -     CBC -     VITAMIN D 25 Hydroxy (Vit-D Deficiency, Fractures) -     Hemoglobin A1c  Primary hypertension Assessment & Plan: New diagnosis today in visit, will treat with hydrochlorothiazide 25 mg daily, I will see her back in 3 months for repeat BP and weight check.   Orders: -     hydroCHLOROthiazide; Take 1 tablet (25 mg total) by mouth daily.  Dispense: 90 tablet; Refill: 1 -     Comprehensive metabolic panel -     TSH  Total calories per day: 2000 per day  Total protein per day: 120 gm (shoot for this number)  Total carbs:  less than 90 grams  Apps: Loseit!  MyFitnessPal, Carb manager  Once a day multivitamin also.  Return in about 3 months (around 05/18/2023) for weight loss.   Karie Georges, MD

## 2023-02-15 NOTE — Patient Instructions (Addendum)
Total calories per day: 2000 per day  Total protein per day: 120 gm (shoot for this number)  Total carbs: less than 90 grams  Apps: Loseit!  MyFitnessPal, Carb manager  Once a day multivitamin also.

## 2023-02-15 NOTE — Assessment & Plan Note (Signed)
I have had an extensive 30 minute conversation today with the patient about healthy eating habits, exercise, calorie and carb goals for sustainable and successful weight loss. I gave the patient caloric and protein daily intake values as well as described the importance of increasing fiber and water intake. I discussed weight loss medications that could be used in the treatment of this patient. Handouts on low carb eating were given to the patient.   The goals I gave the patient are listed below. Patient will continue to be monitored regularly with weight checks and follow up.   Patient has 2 comorbid conditions as as result of her obesity -- HTN and likely OSA.   Patient has tried multiple diets with exercise for several months in the past with only moderate weight loss.

## 2023-02-15 NOTE — Assessment & Plan Note (Signed)
Most likely OSA which is a comorbid condition to her obesity. I will place an order to pulmonary for evaluation. We spent the majority of the visit today discussed weight loss/ diet and exercise, see below.

## 2023-02-16 LAB — COMPREHENSIVE METABOLIC PANEL
ALT: 16 U/L (ref 0–35)
AST: 17 U/L (ref 0–37)
Albumin: 4 g/dL (ref 3.5–5.2)
Alkaline Phosphatase: 58 U/L (ref 39–117)
BUN: 15 mg/dL (ref 6–23)
CO2: 26 mEq/L (ref 19–32)
Calcium: 9.2 mg/dL (ref 8.4–10.5)
Chloride: 103 mEq/L (ref 96–112)
Creatinine, Ser: 0.95 mg/dL (ref 0.40–1.20)
GFR: 79.38 mL/min (ref >60.00)
Glucose, Bld: 71 mg/dL (ref 70–99)
Potassium: 4 mEq/L (ref 3.5–5.1)
Sodium: 137 mEq/L (ref 135–145)
Total Bilirubin: 0.3 mg/dL (ref 0.2–1.2)
Total Protein: 7.2 g/dL (ref 6.0–8.3)

## 2023-02-16 LAB — LIPID PANEL
Cholesterol: 172 mg/dL (ref 0–200)
HDL: 41.2 mg/dL (ref >39.00)
LDL Cholesterol: 109 mg/dL — ABNORMAL HIGH (ref 0–99)
NonHDL: 130.45
Total CHOL/HDL Ratio: 4
Triglycerides: 108 mg/dL (ref 0.0–149.0)
VLDL: 21.6 mg/dL (ref 0.0–40.0)

## 2023-02-16 LAB — CBC
HCT: 37.5 % (ref 36.0–46.0)
Hemoglobin: 12.3 g/dL (ref 12.0–15.0)
MCHC: 32.7 g/dL (ref 30.0–36.0)
MCV: 86.2 fl (ref 78.0–100.0)
Platelets: 307 10*3/uL (ref 150.0–400.0)
RBC: 4.35 Mil/uL (ref 3.87–5.11)
RDW: 13.1 % (ref 11.5–15.5)
WBC: 6.7 10*3/uL (ref 4.0–10.5)

## 2023-02-16 LAB — VITAMIN D 25 HYDROXY (VIT D DEFICIENCY, FRACTURES): VITD: 15.36 ng/mL — ABNORMAL LOW (ref 30.00–100.00)

## 2023-02-16 LAB — TSH: TSH: 1.88 u[IU]/mL (ref 0.35–5.50)

## 2023-02-16 LAB — HEMOGLOBIN A1C: Hgb A1c MFr Bld: 4.9 % (ref 4.6–6.5)

## 2023-02-16 NOTE — Assessment & Plan Note (Signed)
New diagnosis today in visit, will treat with hydrochlorothiazide 25 mg daily, I will see her back in 3 months for repeat BP and weight check.

## 2023-02-19 MED ORDER — VITAMIN D (ERGOCALCIFEROL) 1.25 MG (50000 UNIT) PO CAPS
50000.0000 [IU] | ORAL_CAPSULE | ORAL | 1 refills | Status: DC
Start: 2023-02-19 — End: 2023-07-06

## 2023-02-19 NOTE — Addendum Note (Signed)
Addended by: Karie Georges on: 02/19/2023 08:38 AM   Modules accepted: Orders

## 2023-02-21 ENCOUNTER — Encounter: Payer: Self-pay | Admitting: Family Medicine

## 2023-02-21 DIAGNOSIS — I10 Essential (primary) hypertension: Secondary | ICD-10-CM

## 2023-03-07 NOTE — Telephone Encounter (Signed)
Can you send this to the front desk to have her sept appt rescheduled to August? Thanks!

## 2023-03-13 MED ORDER — TIRZEPATIDE 5 MG/0.5ML ~~LOC~~ SOAJ
5.0000 mg | SUBCUTANEOUS | 0 refills | Status: DC
Start: 2023-03-13 — End: 2023-05-18

## 2023-03-13 MED ORDER — TELMISARTAN 20 MG PO TABS
20.0000 mg | ORAL_TABLET | Freq: Every day | ORAL | 0 refills | Status: DC
Start: 2023-03-13 — End: 2023-06-28

## 2023-03-13 MED ORDER — TIRZEPATIDE 7.5 MG/0.5ML ~~LOC~~ SOAJ
7.5000 mg | SUBCUTANEOUS | 0 refills | Status: DC
Start: 2023-03-13 — End: 2023-05-18

## 2023-03-13 MED ORDER — ZEPBOUND 2.5 MG/0.5ML ~~LOC~~ SOAJ
2.5000 mg | SUBCUTANEOUS | 0 refills | Status: DC
Start: 2023-03-13 — End: 2023-05-18

## 2023-03-21 ENCOUNTER — Ambulatory Visit: Payer: BC Managed Care – PPO | Admitting: Family Medicine

## 2023-03-28 LAB — HM PAP SMEAR: HM Pap smear: NEGATIVE

## 2023-04-12 ENCOUNTER — Encounter (INDEPENDENT_AMBULATORY_CARE_PROVIDER_SITE_OTHER): Payer: Self-pay

## 2023-04-17 ENCOUNTER — Institutional Professional Consult (permissible substitution): Payer: BC Managed Care – PPO | Admitting: Primary Care

## 2023-05-18 ENCOUNTER — Ambulatory Visit: Payer: BC Managed Care – PPO | Admitting: Family Medicine

## 2023-05-18 ENCOUNTER — Encounter: Payer: Self-pay | Admitting: Family Medicine

## 2023-05-18 VITALS — BP 122/88 | HR 70 | Temp 98.4°F | Ht 68.0 in | Wt 282.3 lb

## 2023-05-18 DIAGNOSIS — I10 Essential (primary) hypertension: Secondary | ICD-10-CM | POA: Diagnosis not present

## 2023-05-18 DIAGNOSIS — E559 Vitamin D deficiency, unspecified: Secondary | ICD-10-CM

## 2023-05-18 LAB — VITAMIN D 25 HYDROXY (VIT D DEFICIENCY, FRACTURES): VITD: 21.46 ng/mL — ABNORMAL LOW (ref 30.00–100.00)

## 2023-05-18 MED ORDER — ZEPBOUND 12.5 MG/0.5ML ~~LOC~~ SOAJ
12.5000 mg | SUBCUTANEOUS | 0 refills | Status: DC
Start: 2023-05-18 — End: 2024-04-08

## 2023-05-18 MED ORDER — ZEPBOUND 10 MG/0.5ML ~~LOC~~ SOAJ
10.0000 mg | SUBCUTANEOUS | 0 refills | Status: DC
Start: 2023-05-18 — End: 2024-04-08

## 2023-05-18 MED ORDER — ZEPBOUND 15 MG/0.5ML ~~LOC~~ SOAJ
15.0000 mg | SUBCUTANEOUS | 5 refills | Status: DC
Start: 2023-05-18 — End: 2024-04-08

## 2023-05-18 NOTE — Patient Instructions (Addendum)
[employer] protein shakes-- has 30 grams of protein per shake and it is low sugar.   [employer]-- zepbound  [facility]  [facility]  Check blood pressure at home, try to stay around 120/70. If your BP is dropping too low then cut the telmisartan in half and just take 1/2 tablet daily.

## 2023-05-22 ENCOUNTER — Institutional Professional Consult (permissible substitution): Payer: BC Managed Care – PPO | Admitting: Primary Care

## 2023-05-25 NOTE — Assessment & Plan Note (Signed)
Doing well on hydrochlorothiazide 25 mg daily. Will continue this, BP is chronic, stable.

## 2023-05-25 NOTE — Assessment & Plan Note (Addendum)
She is doing extremely well on the zepbound, only mild nausea but this seem to be tolerable. Will continue to titrate up the dose. RTC every 3 months for weight checks.

## 2023-05-29 ENCOUNTER — Encounter: Payer: Self-pay | Admitting: Primary Care

## 2023-06-04 ENCOUNTER — Other Ambulatory Visit: Payer: Self-pay | Admitting: Family Medicine

## 2023-06-04 DIAGNOSIS — I10 Essential (primary) hypertension: Secondary | ICD-10-CM

## 2023-06-28 ENCOUNTER — Telehealth: Payer: Self-pay | Admitting: *Deleted

## 2023-06-28 DIAGNOSIS — I10 Essential (primary) hypertension: Secondary | ICD-10-CM

## 2023-06-28 MED ORDER — TELMISARTAN 20 MG PO TABS
20.0000 mg | ORAL_TABLET | Freq: Every day | ORAL | 0 refills | Status: DC
Start: 2023-06-28 — End: 2024-04-08

## 2023-06-28 NOTE — Telephone Encounter (Signed)
Rx done. 

## 2023-07-05 ENCOUNTER — Other Ambulatory Visit: Payer: Self-pay | Admitting: Family Medicine

## 2023-07-05 DIAGNOSIS — E559 Vitamin D deficiency, unspecified: Secondary | ICD-10-CM

## 2023-08-10 NOTE — Progress Notes (Unsigned)
FOLLOW UP Date of Service/Encounter:  08/10/23  Subjective:  [name] [name] (DOB: May 04, 1991) is a 32 y.o. female who [facility] in re-evaluation of the following: moderate persistent asthma, seasonal and perennial allergic rhinoconjunctivitis, atopic dermatitis on Dupixent, and allergy with anaphylaxis due to food  History obtained from: chart review and {Persons; PED relatives w/patient:19415::"patient"}.  For Review, [name] [name] was on 02/07/23  with Dr.Chiann Goffredo seen for routine follow-up. See below for summary of history and diagnostics.   Therapeutic plans/changes recommended: Fev1 95%, doing well, continued current meds ----------------------------------------------------- Pertinent History/Diagnostics:  Asthma: moderate persistent Current meds: Wixela 250/50 1 puff 1-2 times daily.  Dupixent Allergic Rhinitis:  - SPT environmental panel-2020 skin testing showed: Positive to grass, tree, ragweed, weed, dust mites, dog, cat, mold, horse, mouse.   Current meds: Daily Xyzal Food Allergy (peanuts, tree nuts, and shellfish except shrimp)  -Past skin testing showed: peanuts, tree nuts, sesame.  - shellfish: breaks out into hives, asthma flares. but eats shrimp and tolerates, eats, finned fish - peanuts and treenuts: has flare of asthma  - sesame: had to use her inhaler, now eating and tolerating as of June 2024. Eczema: controlled on Dupixent, started around 2021; does home dosing Penicillin allergy: told by her mother that she breaks out into hives. Avoids since childhood. Testing and challenge offered. --------------------------------------------------- Today presents for follow-up. Discussed the use of AI scribe software for clinical note transcription with the patient, who gave verbal consent to proceed.  History of Present Illness          Chart Review: ***  All medications reviewed by clinical staff and updated in chart. No new  pertinent medical or surgical history except as noted in HPI.  ROS: All others negative except as noted per HPI.   Objective:  There were no vitals taken for this visit. There is no height or weight on file to calculate BMI. Physical Exam: General Appearance:  Alert, cooperative, no distress, appears stated age  Head:  Normocephalic, without obvious abnormality, atraumatic  Eyes:  Conjunctiva clear, EOM's intact  Ears {Blank multiple:19196:a:"***","EACs normal bilaterally","normal TMs bilaterally","ear tubes present bilaterally without exudate"}  Nose: Nares normal, {Blank multiple:19196:a:"***","hypertrophic turbinates","normal mucosa","no visible anterior polyps","septum midline"}  Throat: Lips, tongue normal; teeth and gums normal, {Blank multiple:19196:a:"***","normal posterior oropharynx","tonsils 2+","tonsils 3+","no tonsillar exudate","+ cobblestoning","surgically absent tonsils"}  Neck: Supple, symmetrical  Lungs:   {Blank multiple:19196:a:"***","clear to auscultation bilaterally","end-expiratory wheezing","wheezing throughout"}, Respirations unlabored, {Blank multiple:19196:a:"***","no coughing","intermittent dry coughing"}  Heart:  {Blank multiple:19196:a:"***","regular rate and rhythm","no murmur"}, Appears well perfused  Extremities: No edema  Skin: {Blank multiple:19196:a:"***","erythematous, dry patches scattered on ***","lichenification on ***","Skin color, texture, turgor normal","no rashes or lesions on visualized portions of skin"}  Neurologic: No gross deficits   Labs:  Lab Orders  No laboratory test(s) ordered today    Spirometry:  Tracings reviewed. Her effort: {Blank single:19197::"Good reproducible efforts.","It was hard to get consistent efforts and there is a question as to whether this reflects a maximal maneuver.","Poor effort, data can not be interpreted.","Variable effort-results affected","effort okay for first attempt at spirometry.","Results not reproducible  due to ***"} FVC: ***L FEV1: ***L, ***% predicted FEV1/FVC ratio: ***% Interpretation: {Blank single:19197::"Spirometry consistent with mild obstructive disease","Spirometry consistent with moderate obstructive disease","Spirometry consistent with severe obstructive disease","Spirometry consistent with possible restrictive disease","Spirometry consistent with mixed obstructive and restrictive disease","Spirometry uninterpretable due to technique","Spirometry consistent with normal pattern","No overt abnormalities noted given today's efforts","Nonobstructive ratio, low FEV1","Nonobstructive ratio, low FEV1, possible restriction"}.  Please see  scanned spirometry results for details.  Skin Testing: {Blank single:19197::"Select foods","Environmental allergy panel","Environmental allergy panel and select foods","Food allergy panel","None","Deferred due to recent antihistamines use","deferred due to recent reaction","Pediatric Environmental Allergy Panel","Pediatric Food Panel","Select foods and environmental allergies"}. {Blank single:19197::"Adequate positive and negative controls","Inadequate positive control-testing invalid","Adequate positive and negative controls, dermatographism present, testing difficult to interpret"}. Results discussed with patient/family.   {Blank single:19197::"Allergy testing results were read and interpreted by myself, documented by clinical staff.","Allergy testing results were read by ***,FNP, documented by clinical staff"}  Assessment/Plan   ***  Other: {Blank multiple:19196:a:"***","samples provided of: ***","spacer provided in clinic","nebulizer machine provided in clinic","school forms provided","reviewed spirometry technique","reviewed inhaler technique","allergy injection given in clinic today","biologic given in clinic today"}  [name], MD  [facility]

## 2023-08-13 ENCOUNTER — Ambulatory Visit: Payer: BC Managed Care – PPO | Admitting: Internal Medicine

## 2023-08-13 DIAGNOSIS — J309 Allergic rhinitis, unspecified: Secondary | ICD-10-CM

## 2023-08-23 ENCOUNTER — Encounter: Payer: Self-pay | Admitting: Family Medicine

## 2023-08-27 ENCOUNTER — Telehealth: Payer: Self-pay | Admitting: Internal Medicine

## 2023-08-27 MED ORDER — FLUTICASONE-SALMETEROL 250-50 MCG/ACT IN AEPB: 1.0000 | INHALATION_SPRAY | Freq: Every day | RESPIRATORY_TRACT | 0 refills | Status: DC

## 2023-08-27 NOTE — Addendum Note (Signed)
Addended by: Orson Aloe on: 08/27/2023 03:01 PM   Modules accepted: Orders

## 2023-08-27 NOTE — Telephone Encounter (Signed)
Patient never picked up refills from the [facility] pharmacy. Patient has been notified that the refill will be discontinued once the courtesy refill has been sent to [facility].  Patient plans to pick up Wixela 250/50 1 puff daily today

## 2023-08-27 NOTE — Telephone Encounter (Signed)
Patient is requesting a courtesy refill for fluticasone-salmeterol [facility]).  Patient is requesting it to be sent over to [facility] on [address].  Patent has an appointment scheduled for Jan 6th at 3pm with Dr.Dennis.  Best Contact : [phone]

## 2023-09-03 ENCOUNTER — Encounter: Payer: Self-pay | Admitting: Internal Medicine

## 2023-09-03 ENCOUNTER — Ambulatory Visit: Payer: BC Managed Care – PPO | Admitting: Internal Medicine

## 2023-09-03 VITALS — BP 122/72 | HR 88 | Resp 16

## 2023-09-03 DIAGNOSIS — H6993 Unspecified Eustachian tube disorder, bilateral: Secondary | ICD-10-CM

## 2023-09-03 DIAGNOSIS — J302 Other seasonal allergic rhinitis: Secondary | ICD-10-CM

## 2023-09-03 DIAGNOSIS — L2089 Other atopic dermatitis: Secondary | ICD-10-CM | POA: Diagnosis not present

## 2023-09-03 DIAGNOSIS — T7800XD Anaphylactic reaction due to unspecified food, subsequent encounter: Secondary | ICD-10-CM | POA: Diagnosis not present

## 2023-09-03 DIAGNOSIS — J3089 Other allergic rhinitis: Secondary | ICD-10-CM

## 2023-09-03 DIAGNOSIS — J4540 Moderate persistent asthma, uncomplicated: Secondary | ICD-10-CM | POA: Diagnosis not present

## 2023-09-03 DIAGNOSIS — H1010 Acute atopic conjunctivitis, unspecified eye: Secondary | ICD-10-CM

## 2023-09-03 MED ORDER — AZELASTINE-FLUTICASONE 137-50 MCG/ACT NA SUSP: 1.0000 | Freq: Two times a day (BID) | NASAL | 5 refills | Status: DC | PRN

## 2023-09-03 MED ORDER — ALBUTEROL SULFATE HFA 108 (90 BASE) MCG/ACT IN AERS: 1.0000 | INHALATION_SPRAY | Freq: Four times a day (QID) | RESPIRATORY_TRACT | 1 refills | Status: DC | PRN

## 2023-09-03 MED ORDER — FLUTICASONE-SALMETEROL 250-50 MCG/ACT IN AEPB: 1.0000 | INHALATION_SPRAY | Freq: Every day | RESPIRATORY_TRACT | 5 refills | Status: DC

## 2023-09-03 MED ORDER — EPINEPHRINE 0.3 MG/0.3ML IJ SOAJ: 0.3000 mg | INTRAMUSCULAR | 1 refills | Status: DC | PRN

## 2023-09-03 MED ORDER — TRIAMCINOLONE ACETONIDE 0.1 % EX OINT: 1.0000 | TOPICAL_OINTMENT | Freq: Two times a day (BID) | CUTANEOUS | 3 refills | Status: DC | PRN

## 2023-09-03 MED ORDER — ALBUTEROL SULFATE (2.5 MG/3ML) 0.083% IN NEBU: 2.5000 mg | INHALATION_SOLUTION | Freq: Four times a day (QID) | RESPIRATORY_TRACT | 1 refills | Status: DC | PRN

## 2023-09-03 NOTE — Patient Instructions (Addendum)
 Moderate persistent asthma-  Daily controller medication(s):  Wixela 250/50 1 puff daily to help prevent cough and wheeze  For Asthma flares: increase to Wixela 250/50 1 puff twice daily to help prevent cough and wheeze May use albuterol  rescue inhaler 2 puffs every 4 to 6 hours as needed for shortness of breath, chest tightness, coughing, and wheezing. May use albuterol  rescue inhaler 2 puffs 5 to 15 minutes prior to strenuous physical activities. Monitor frequency of use.  Asthma control goals:  Full participation in all desired activities (may need albuterol  before activity) Albuterol  use two times or less a week on average (not counting use with activity) Cough interfering with sleep two times or less a month Oral steroids no more than once a year No hospitalizations  Other allergic rhinitis- Eustachian tube dysfunction and ear drainage, interested in allergy  injections. Last completed over 10 years ago.  Return for allergy  testing January 20th at 9:00 AM (must stop antihistamines for 3 days prior to visit). 2020 skin testing showed: Positive to grass, tree, ragweed, weed, dust mites, dog, cat, mold, horse, mouse.   Continue environmental control measures. May use over the counter antihistamines such as Zyrtec (cetirizine), Claritin  (loratadine ), Allegra (fexofenadine), or Xyzal (levocetirizine) daily as needed. Start Dymista  1 spray twice daily as needed.    Other atopic dermatitis-controlled Continue dupixent  injections every 2 weeks at home.  Continue proper skin care measures.   Hands-controlled Try to keep it dry at work. Wear a cotton glove under the rubber glove - get latex free. Use the following for the hands:  May use Eucrisa  (crisaborole ) 2% ointment twice a day on mild eczema flares on the face and body. This is a non-steroid ointment.  If it burns, place the medication in the refrigerator.  Apply a thin layer of moisturizer and then apply the Eucrisa  on top of  it. Medications: Only apply to affected areas that are "rough and red" Body:  May use triamcinolone  0.1% ointment twice a day as needed for eczema flares. Do not use on the face, neck, armpits or groin area. Do not use more than 3 weeks in a row.  For more than twice a day use the following: Aquaphor, Vaseline, Cerave, Cetaphil, Eucerin, Vanicream.  Itching: Continue Claritin  10mg  in the morning as needed.    Adverse food reaction- Can update shellfish and nut testing at follow-up. Past skin testing showed: peanuts, tree nuts, sesame. Eating and tolerating sesame and shrimp. Continue to avoid peanuts, tree nuts, shellfish. Okay to eat shrimp as before.  For mild symptoms you can take over the counter antihistamines such as Benadryl and monitor symptoms closely. If symptoms worsen or if you have severe symptoms including breathing issues, throat closure, significant swelling, whole body hives, severe diarrhea and vomiting, lightheadedness then inject epinephrine  and seek immediate medical care afterwards. Schedule for crab challenge.  If interested we can schedule food challenge to crabs. You must be off antihistamines for 3-5 days before. Must be in good health and not ill. Plan on being in the office for 2-3 hours and must bring in the food you want to do the oral challenge for. You must call to schedule an appointment and specify it's for a food challenge.   H/O Penicillin allergy : - please schedule follow-up appt at your convenience for penicillin testing followed by graded oral challenge if indicated - please refrain from taking any antihistamines at least 3 days prior to this appointment  - around 80% of individuals outgrow this allergy   in ~ 10 years and carrying it as a diagnosis can prevent you from getting proper therapy if needed    Follow up January 20th at 9:00 AM for allergy  testing. Stop antihistamines for 3 days prior to visit . It was a pleasure seeing you again today!! Thank  you for letting me participate in your care.

## 2023-09-03 NOTE — Progress Notes (Signed)
 FOLLOW UP Date of Service/Encounter:  09/03/23  Subjective:  [name] [name] (DOB: 07-31-1991) is a 33 y.o. female who [facility] in re-evaluation of the following: moderate persistent asthma, seasonal and perennial allergic rhinoconjunctivitis, atopic dermatitis on Dupixent , and allergy  with anaphylaxis due to food  History obtained from: chart review and patient.  For Review, LV was on 02/07/23  with Dr.Velda Wendt seen for routine follow-up. See below for summary of history and diagnostics.   Therapeutic plans/changes recommended: Fev1 95% ----------------------------------------------------- Pertinent History/Diagnostics:  Asthma: moderate persistent Current meds: Wixela 250/50 1 puff 1-2 times daily, dupixent  q2 weeks, albuterol  PRN Allergic Rhinitis:  - SPT environmental panel-2020 skin testing showed: Positive to grass, tree, ragweed, weed, dust mites, dog, cat, mold, horse, mouse.   Current meds: Claritin  or Xyzal daily as needed, Flonase  She was previously on allergy  injections which were helpful.  Last received these over 10 years ago. Food Allergy  (peanuts, tree nuts, and shellfish except shrimp)  -Past skin testing showed: peanuts, tree nuts, sesame.  - shellfish: breaks out into hives, asthma flares. but eats shrimp and tolerates, eats, finned fish; interested in crab challenge; 2020 labs were slightly positive for shrimp and below 0.35 threshold for clam, crab, scallop and lobster - peanuts and treenuts: has flare of asthma  - sesame: had to use her inhaler, now eating and tolerating as of June 2024. Eczema/hand dermatitis: controlled on Dupixent , started around 2021; does home dosing Penicillin allergy : told by her mother that she breaks out into hives. Avoids since childhood. --------------------------------------------------- Today presents for follow-up. Discussed the use of AI scribe software for clinical note  transcription with the patient, who gave verbal consent to proceed.  History of Present Illness   The patient, with a history of asthma and eczema, reports a generally good health status. She recently moved to a new townhome in [place] and has been busy with work. She has been using Wixela daily for her asthma, usually once a day, but occasionally twice a day during periods of increased symptoms. She experienced an asthma attack two weeks ago, which she attributes to dust exposure during her move. During this episode, she used Wixela twice a day for two days. She also has a breathing machine and requested refills for the albuterol  inhaler and albuterol  for the machine.  The patient is also on Dupixent , which she self-administers at home for her eczema. She reports good skin condition and is due for a renewal of the medication. She also uses triamcinolone  cream as needed, approximately once a month, but has stopped using mometasone  and Eucrisa .  The patient has a history of food allergies, specifically to peanuts, tree nuts, and shellfish, excluding shrimp. She has not tried any new shellfish recently. She is currently taking Claritin  and occasionally Xyzal for her allergies. She also reports increased ear drainage and itching, which she suspects may be related to her allergies. She has not used Flonase  in a while and is considering seeing an ear, nose, and throat doctor for her ear symptoms.  The patient has previously undergone allergy  shots over ten years ago, which she found helpful. She is considering restarting these shots and is scheduled for updated allergy  testing. She also requested a refill of her EpiPen .       All medications reviewed by clinical staff and updated in chart. No new pertinent medical or surgical history except as noted in HPI.  ROS: All others negative except as noted per  HPI.   Objective:  BP 122/72 (BP Location: Left Arm, Patient Position: Sitting, Cuff Size: Large)    Pulse 88   Resp 16   SpO2 97%  There is no height or weight on file to calculate BMI. Physical Exam: General Appearance:  Alert, cooperative, no distress, appears stated age  Head:  Normocephalic, without obvious abnormality, atraumatic  Eyes:  Conjunctiva clear, EOM's intact  Ears Serous liquid behind right TM, left TM not visible due to cerumen occlusion and EACs normal bilaterally  Nose: Nares normal, hypertrophic turbinates, normal mucosa, and no visible anterior polyps  Throat: Lips, tongue normal; teeth and gums normal, normal posterior oropharynx  Neck: Supple, symmetrical  Lungs:   clear to auscultation bilaterally, Respirations unlabored, no coughing  Heart:  regular rate and rhythm and no murmur, Appears well perfused  Extremities: No edema  Skin: erythematous, dry patches scattered on bilateral hands and lichenification on bilateral hands  Neurologic: No gross deficits   Labs:  Lab Orders  No laboratory test(s) ordered today    Spirometry:  Tracings reviewed. Her effort: Good reproducible efforts. FVC: 3.79L FEV1: 2.83L, 94% predicted FEV1/FVC ratio: 0.75 Interpretation: Spirometry consistent with normal pattern.  Please see scanned spirometry results for details.   Assessment/Plan   Moderate persistent asthma-at goal Daily controller medication(s):  Wixela 250/50 1 puff daily to help prevent cough and wheeze  For Asthma flares: increase to Wixela 250/50 1 puff twice daily to help prevent cough and wheeze May use albuterol  rescue inhaler 2 puffs every 4 to 6 hours as needed for shortness of breath, chest tightness, coughing, and wheezing. May use albuterol  rescue inhaler 2 puffs 5 to 15 minutes prior to strenuous physical activities. Monitor frequency of use.  Asthma control goals:  Full participation in all desired activities (may need albuterol  before activity) Albuterol  use two times or less a week on average (not counting use with activity) Cough interfering  with sleep two times or less a month Oral steroids no more than once a year No hospitalizations  Other allergic rhinitis-not at goal Eustachian tube dysfunction and ear drainage, interested in allergy  injections. Last completed over 10 years ago.  Return for allergy  testing January 20th at 9:00 AM (must stop antihistamines for 3 days prior to visit). 2020 skin testing showed: Positive to grass, tree, ragweed, weed, dust mites, dog, cat, mold, horse, mouse.   Continue environmental control measures. May use over the counter antihistamines such as Zyrtec (cetirizine), Claritin  (loratadine ), Allegra (fexofenadine), or Xyzal (levocetirizine) daily as needed. Start Dymista  1 spray twice daily as needed.    Other atopic dermatitis-controlled Continue dupixent  injections every 2 weeks at home.  Continue proper skin care measures.  Hands Try to keep it dry at work. Wear a cotton glove under the rubber glove - get latex free. Use the following for the hands:  May use Eucrisa  (crisaborole ) 2% ointment twice a day on mild eczema flares on the face and body. This is a non-steroid ointment.  If it burns, place the medication in the refrigerator.  Apply a thin layer of moisturizer and then apply the Eucrisa  on top of it. Medications: Only apply to affected areas that are "rough and red" Body:  May use triamcinolone  0.1% ointment twice a day as needed for eczema flares. Do not use on the face, neck, armpits or groin area. Do not use more than 3 weeks in a row.  For more than twice a day use the following: Aquaphor, Vaseline, Cerave, Cetaphil, Eucerin,  Vanicream.  Itching: Continue Claritin  10mg  in the morning as needed.   Food allergy : shellfish (except shrimp), tree nuts and peanuts. Can update shellfish and nut testing at follow-up. Past skin testing showed: peanuts, tree nuts, sesame. Eating and tolerating sesame and shrimp. Continue to avoid peanuts, tree nuts, shellfish. Okay to eat shrimp as  before.  For mild symptoms you can take over the counter antihistamines such as Benadryl and monitor symptoms closely. If symptoms worsen or if you have severe symptoms including breathing issues, throat closure, significant swelling, whole body hives, severe diarrhea and vomiting, lightheadedness then inject epinephrine  and seek immediate medical care afterwards. Schedule for crab challenge.  If interested we can schedule food challenge to crabs. You must be off antihistamines for 3-5 days before. Must be in good health and not ill. Plan on being in the office for 2-3 hours and must bring in the food you want to do the oral challenge for. You must call to schedule an appointment and specify it's for a food challenge.   H/O Penicillin allergy : - please schedule follow-up appt at your convenience for penicillin testing followed by graded oral challenge if indicated - please refrain from taking any antihistamines at least 3 days prior to this appointment  - around 80% of individuals outgrow this allergy  in ~ 10 years and carrying it as a diagnosis can prevent you from getting proper therapy if needed    Follow up January 20th at 9:00 AM for allergy  testing (1-55, shellfish, tree nuts, peanut). Stop antihistamines for 3 days prior to visit . It was a pleasure seeing you again today!! Thank you for letting me participate in your care.  Other: nebulizer machine provided in clinic  Rocky Endow, MD  [facility] 

## 2023-09-13 ENCOUNTER — Telehealth: Payer: Self-pay

## 2023-09-13 ENCOUNTER — Encounter (INDEPENDENT_AMBULATORY_CARE_PROVIDER_SITE_OTHER): Payer: Self-pay | Admitting: Otolaryngology

## 2023-09-13 NOTE — Telephone Encounter (Signed)
-----   Message from Verlee Monte sent at 09/03/2023  4:51 PM EST ----- Can we refer to ENT for ETD? Thanks

## 2023-09-16 NOTE — Progress Notes (Deleted)
  Date of Service/Encounter:  09/16/23  Allergy testing appointment   Previous visit on 09/03/23, seen for moderate persistent asthma, seasonal and perennial allergic rhinoconjunctivitis, atopic dermatitis on Dupixent, and allergy with anaphylaxis due to food .  Please see that note for additional details.  Today reports for allergy diagnostic testing:    DIAGNOSTICS:  Skin Testing: Environmental allergy panel and select foods.1-55, shellfish mix, individual shellfish, peanuts, and tree nuts. {Blank single:19197::"Adequate positive and negative controls","Inadequate positive control-testing invalid","Adequate positive and negative controls, dermatographism present, testing difficult to interpret"}. Results discussed with patient/family.   Allergy testing results were read and interpreted by myself, documented by clinical staff.  Patient provided with copy of allergy testing along with avoidance measures when indicated. ***  [name], MD  [facility]

## 2023-09-17 ENCOUNTER — Ambulatory Visit: Payer: BC Managed Care – PPO | Admitting: Internal Medicine

## 2023-11-01 ENCOUNTER — Institutional Professional Consult (permissible substitution) (INDEPENDENT_AMBULATORY_CARE_PROVIDER_SITE_OTHER): Payer: BC Managed Care – PPO

## 2023-11-01 ENCOUNTER — Ambulatory Visit (INDEPENDENT_AMBULATORY_CARE_PROVIDER_SITE_OTHER): Payer: BC Managed Care – PPO | Admitting: Audiology

## 2023-12-30 ENCOUNTER — Encounter (HOSPITAL_BASED_OUTPATIENT_CLINIC_OR_DEPARTMENT_OTHER): Payer: Self-pay

## 2023-12-30 ENCOUNTER — Emergency Department (HOSPITAL_BASED_OUTPATIENT_CLINIC_OR_DEPARTMENT_OTHER)
Admission: EM | Admit: 2023-12-30 | Discharge: 2023-12-30 | Disposition: A | Discharge status: Home / Self Care | Attending: Emergency Medicine | Admitting: Emergency Medicine

## 2023-12-30 ENCOUNTER — Other Ambulatory Visit: Payer: Self-pay

## 2023-12-30 DIAGNOSIS — R197 Diarrhea, unspecified: Secondary | ICD-10-CM | POA: Diagnosis not present

## 2023-12-30 DIAGNOSIS — Z91010 Allergy to peanuts: Secondary | ICD-10-CM | POA: Diagnosis not present

## 2023-12-30 DIAGNOSIS — Z7951 Long term (current) use of inhaled steroids: Secondary | ICD-10-CM | POA: Insufficient documentation

## 2023-12-30 DIAGNOSIS — J45909 Unspecified asthma, uncomplicated: Secondary | ICD-10-CM | POA: Diagnosis not present

## 2023-12-30 DIAGNOSIS — Z79899 Other long term (current) drug therapy: Secondary | ICD-10-CM | POA: Diagnosis not present

## 2023-12-30 DIAGNOSIS — Z91040 Latex allergy status: Secondary | ICD-10-CM | POA: Diagnosis not present

## 2023-12-30 DIAGNOSIS — R1110 Vomiting, unspecified: Secondary | ICD-10-CM

## 2023-12-30 DIAGNOSIS — R112 Nausea with vomiting, unspecified: Secondary | ICD-10-CM | POA: Insufficient documentation

## 2023-12-30 DIAGNOSIS — R109 Unspecified abdominal pain: Secondary | ICD-10-CM | POA: Insufficient documentation

## 2023-12-30 LAB — RESP PANEL BY RT-PCR (RSV, FLU A&B, COVID)  RVPGX2
Influenza A by PCR: NEGATIVE
Influenza B by PCR: NEGATIVE
Resp Syncytial Virus by PCR: NEGATIVE
SARS Coronavirus 2 by RT PCR: NEGATIVE

## 2023-12-30 LAB — URINALYSIS, ROUTINE W REFLEX MICROSCOPIC
Bacteria, UA: NONE SEEN
Bilirubin Urine: NEGATIVE
Glucose, UA: NEGATIVE mg/dL
Hgb urine dipstick: NEGATIVE
Leukocytes,Ua: NEGATIVE
Nitrite: NEGATIVE
Protein, ur: 30 mg/dL — AB
Specific Gravity, Urine: 1.031 — ABNORMAL HIGH (ref 1.005–1.030)
pH: 6 (ref 5.0–8.0)

## 2023-12-30 LAB — PREGNANCY, URINE: Preg Test, Ur: NEGATIVE

## 2023-12-30 MED ORDER — ONDANSETRON 4 MG PO TBDP: 4.0000 mg | ORAL_TABLET | Freq: Three times a day (TID) | ORAL | 0 refills | Status: DC | PRN

## 2023-12-30 MED ORDER — ONDANSETRON 4 MG PO TBDP
4.0000 mg | ORAL_TABLET | Freq: Once | ORAL | Status: AC
  Administered 2023-12-30: 4 mg via ORAL
  Filled 2023-12-30: qty 1

## 2023-12-30 MED ORDER — SODIUM CHLORIDE 0.9 % IV BOLUS
1000.0000 mL | Freq: Once | INTRAVENOUS | Status: AC
  Administered 2023-12-30: 1000 mL via INTRAVENOUS

## 2023-12-30 MED ORDER — LOPERAMIDE HCL 2 MG PO CAPS
4.0000 mg | ORAL_CAPSULE | Freq: Once | ORAL | Status: AC
  Administered 2023-12-30: 4 mg via ORAL
  Filled 2023-12-30: qty 2

## 2023-12-30 MED ORDER — LOPERAMIDE HCL 2 MG PO CAPS: 2.0000 mg | ORAL_CAPSULE | Freq: Four times a day (QID) | ORAL | 0 refills | Status: DC | PRN

## 2023-12-30 NOTE — ED Notes (Signed)
 Pt given discharge instructions and reviewed prescriptions. Opportunities given for questions. Pt verbalizes understanding. PIV removed x1. Jillyn Hidden, RN

## 2023-12-30 NOTE — ED Provider Notes (Signed)
 [place] [facility] DEPARTMENT AT [facility] Provider Note   CSN: [id number] Arrival date & time: 12/30/23  2952     History  Chief Complaint  Patient presents with   Abdominal Pain    [name] [name] is a 33 y.o. female.  HPI     This is a 33 year old female who presents with nausea, vomiting, diarrhea.  Patient reports 1 week history of nonbilious, nonbloody emesis and nonbloody diarrhea.  She reports crampy abdominal pain and "feeling nauseated."  States that she is having difficulty keeping things down.  Has felt chills without fevers.  No known sick contacts.  Denies urinary symptoms.  Does not believe herself to be pregnant.  Home Medications Prior to Admission medications   Medication Sig Start Date End Date Taking? Authorizing Provider  albuterol  (PROVENTIL ) (2.5 MG/3ML) 0.083% nebulizer solution Take 3 mLs (2.5 mg total) by nebulization every 6 (six) hours as needed for wheezing or shortness of breath. 09/03/23   Sean Czar, MD  albuterol  (VENTOLIN  HFA) 108 [phone] Base) MCG/ACT inhaler Inhale 1-2 puffs into the lungs every 6 (six) hours as needed for wheezing or shortness of breath. 09/03/23   Sean Czar, MD  Azelastine -Fluticasone  (DYMISTA ) 137-50 MCG/ACT SUSP Place 1 spray into both nostrils 2 (two) times daily as needed. 09/03/23   Sean Czar, MD  Crisaborole  (EUCRISA ) 2 % OINT 1 application 2 times daily on hands AS Needed to red itchy areas. 08/02/22   Sean Czar, MD  dupilumab  (DUPIXENT ) 300 MG/2ML prefilled syringe Inject 300 mg into the skin every 14 (fourteen) days. 08/14/22   Trudy Fusi, DO  EPINEPHrine  0.3 mg/0.3 mL IJ SOAJ injection Inject 0.3 mg into the muscle as needed for anaphylaxis. 09/03/23   Sean Czar, MD  fluticasone  (FLONASE ) 50 MCG/ACT nasal spray Place 2 sprays into both nostrils daily. Place 2 sprays in each nostril once a day as needed for stuffy nose 02/07/23   Sean Czar, MD  fluticasone -salmeterol (WIXELA INHUB)  250-50 MCG/ACT AEPB Inhale 1 puff into the lungs daily in the afternoon. For Asthma flares increase to Wixela 250/50 1 puff twice daily to help prevent cough and wheeze. Rinse mouth after each use. 09/03/23   Sean Czar, MD  hydrochlorothiazide  (HYDRODIURIL ) 25 MG tablet Take 25 mg by mouth daily. 05/10/23   [provider]  hydrocortisone  2.5 % ointment Apply topically 2 (two) times daily. May use on the face up to 7 days at a time. 08/02/22   Sean Czar, MD  loratadine  (CLARITIN ) 10 MG tablet Take 1 tablet (10 mg total) by mouth daily. 02/07/23   Sean Czar, MD  telmisartan  (MICARDIS ) 20 MG tablet Take 1 tablet (20 mg total) by mouth daily. 06/28/23   Aida House, MD  tirzepatide  (ZEPBOUND ) 10 MG/0.5ML Pen Inject 10 mg into the skin once a week. 05/18/23   Aida House, MD  tirzepatide  (ZEPBOUND ) 12.5 MG/0.5ML Pen Inject 12.5 mg into the skin once a week. 05/18/23   Aida House, MD  tirzepatide  (ZEPBOUND ) 15 MG/0.5ML Pen Inject 15 mg into the skin once a week. 05/18/23   Aida House, MD  triamcinolone  ointment (KENALOG ) 0.1 % Apply 1 Application topically 2 (two) times daily as needed. Do not use on the face, neck, armpits or groin area. 09/03/23   Sean Czar, MD  Vitamin D , Ergocalciferol , (DRISDOL ) 1.25 MG (50000 UNIT) CAPS capsule TAKE 1 CAPSULE (50,000 UNITS TOTAL) BY MOUTH EVERY  7 (SEVEN) DAYS 07/06/23   Aida House, MD      Allergies    Peanut-containing drug products, Shrimp [shellfish allergy ], Other, Latex, and Penicillins    Review of Systems   Review of Systems  Constitutional:  Positive for chills. Negative for fever.  Gastrointestinal:  Positive for diarrhea, nausea and vomiting. Negative for abdominal pain.  All other systems reviewed and are negative.   Physical Exam Updated Vital Signs BP (!) 125/92   Pulse 82   Temp 98.4 F (36.9 C) (Oral)   Resp 16   SpO2 95%  Physical Exam Vitals and nursing note reviewed.   Constitutional:      Appearance: She is well-developed. She is obese. She is not ill-appearing.  HENT:     Head: Normocephalic and atraumatic.  Eyes:     Pupils: Pupils are equal, round, and reactive to light.  Cardiovascular:     Rate and Rhythm: Normal rate and regular rhythm.     Heart sounds: Normal heart sounds.  Pulmonary:     Effort: Pulmonary effort is normal. No respiratory distress.     Breath sounds: No wheezing.  Abdominal:     General: Bowel sounds are normal.     Palpations: Abdomen is soft.     Tenderness: There is no abdominal tenderness. There is no guarding or rebound.  Musculoskeletal:     Cervical back: Neck supple.  Skin:    General: Skin is warm and dry.  Neurological:     Mental Status: She is alert and oriented to person, place, and time.     ED Results / Procedures / Treatments   Labs (all labs ordered are listed, but only abnormal results are displayed) Labs Reviewed  RESP PANEL BY RT-PCR (RSV, FLU A&B, COVID)  [id number]  URINALYSIS, ROUTINE W REFLEX MICROSCOPIC  PREGNANCY, URINE    EKG None  Radiology No results found.  Procedures Procedures    Medications Ordered in ED Medications  ondansetron  (ZOFRAN -ODT) disintegrating tablet 4 mg (has no administration in time range)  sodium chloride  0.9 % bolus 1,000 mL (has no administration in time range)  loperamide (IMODIUM) capsule 4 mg (4 mg Oral Given 12/30/23 0640)    ED Course/ Medical Decision Making/ A&P                                 Medical Decision Making Amount and/or Complexity of Data Reviewed Labs: ordered.  Risk Prescription drug management.   This patient presents to the ED for concern of nausea, vomiting, diarrhea, this involves an extensive number of treatment options, and is a complaint that carries with it a high risk of complications and morbidity.  I considered the following differential and admission for this acute, potentially life threatening condition.  The  differential diagnosis includes gastritis, gastroenteritis, colitis, viral illness, less likely intra-abdominal pathology such as SBO or appendicitis  MDM:    This is a 33 year old female who presents with nausea, vomiting, diarrhea.  Ongoing for 5 to 7 days.  She is nontoxic and vital signs are reassuring.  She appears well-hydrated.  Abdominal exam is fairly benign as she is nontender.  Would favor benign etiology such as viral syndrome.  Patient was given Zofran  and Imodium as well as fluids.  Will obtain urinalysis to evaluate for hydration status as well as pregnancy.  Also viral testing was sent.  Patient signed out to oncoming provider.  [employer],  imaging, consults)  Labs: I Ordered, and personally interpreted labs.  The pertinent results include: Urinalysis, urine pregnancy pending  Imaging Studies ordered: I ordered imaging studies including none I independently visualized and interpreted imaging. I agree with the radiologist interpretation  Additional history obtained from chart review.  External records from outside source obtained and reviewed including prior evaluations  Cardiac Monitoring: The patient was not maintained on a cardiac monitor.  If on the cardiac monitor, I personally viewed and interpreted the cardiac monitored which showed an underlying rhythm of: N/A  Reevaluation: After the interventions noted above, I reevaluated the patient and found that they have :stayed the same  Social Determinants of Health:  lives independently  Disposition: Pending, signed out to oncoming provider  Co morbidities that complicate the patient evaluation  Past Medical History:  Diagnosis Date   Allergic rhinitis    Allergy     Asthma    hosp 06/2009   Chicken pox    Eczema    Kidney stone    right ER 09/17/09, 707/2012     Medicines Meds ordered this encounter  Medications   ondansetron  (ZOFRAN -ODT) disintegrating tablet 4 mg   loperamide (IMODIUM) capsule 4 mg   sodium  chloride 0.9 % bolus 1,000 mL    I have reviewed the patients home medicines and have made adjustments as needed  Problem List / ED Course: Problem List Items Addressed This Visit   None Visit Diagnoses       Vomiting and diarrhea    -  Primary                   Final Clinical Impression(s) / ED Diagnoses Final diagnoses:  Vomiting and diarrhea    Rx / DC Orders ED Discharge Orders     None         Rory Collard, MD 12/30/23 [id number]

## 2023-12-30 NOTE — ED Triage Notes (Signed)
 Pt states that she has been having n/v/d for the past week, no fevers, c/o of upper abd pain

## 2023-12-30 NOTE — Discharge Instructions (Signed)
 Take the Zofran  ODT to keep the vomiting in check.  Take the Imodium A-D to help with the diarrhea.  Recommend hydrating yourself small amounts of liquid something like Gatorade throughout the day if tolerated well then can advance to a bland diet or brat diet.  Make an appointment follow-up with your primary care doctor just in case things do not resolve.  Return for any new or worse symptoms.  Glad that you are feeling much better

## 2023-12-30 NOTE — ED Provider Notes (Signed)
 Patient feeling much better.  Will discharge with Imodium and Zofran  ODT and have her follow-up with her primary care doctor.  Patient's labs urinalysis pregnancy test and respiratory panel without any significant abnormalities.  Pregnancy test negative.  Respiratory panel negative.  Urinalysis negative.   Junell Cullifer, MD 12/30/23 [id number]

## 2023-12-31 ENCOUNTER — Ambulatory Visit: Admitting: Family Medicine

## 2023-12-31 ENCOUNTER — Ambulatory Visit: Payer: Self-pay

## 2023-12-31 ENCOUNTER — Encounter: Payer: Self-pay | Admitting: Family Medicine

## 2023-12-31 VITALS — BP 120/77 | HR 77 | Temp 97.7°F | Ht 68.0 in | Wt 273.8 lb

## 2023-12-31 DIAGNOSIS — R1011 Right upper quadrant pain: Secondary | ICD-10-CM | POA: Diagnosis not present

## 2023-12-31 DIAGNOSIS — R109 Unspecified abdominal pain: Secondary | ICD-10-CM | POA: Diagnosis not present

## 2023-12-31 DIAGNOSIS — R1031 Right lower quadrant pain: Secondary | ICD-10-CM

## 2023-12-31 DIAGNOSIS — R112 Nausea with vomiting, unspecified: Secondary | ICD-10-CM

## 2023-12-31 DIAGNOSIS — Z87442 Personal history of urinary calculi: Secondary | ICD-10-CM

## 2023-12-31 DIAGNOSIS — R197 Diarrhea, unspecified: Secondary | ICD-10-CM

## 2023-12-31 LAB — COMPREHENSIVE METABOLIC PANEL WITH GFR
ALT: 13 U/L (ref 0–35)
AST: 14 U/L (ref 0–37)
Albumin: 3.8 g/dL (ref 3.5–5.2)
Alkaline Phosphatase: 46 U/L (ref 39–117)
BUN: 4 mg/dL — ABNORMAL LOW (ref 6–23)
CO2: 29 meq/L (ref 19–32)
Calcium: 8.7 mg/dL (ref 8.4–10.5)
Chloride: 106 meq/L (ref 96–112)
Creatinine, Ser: 0.91 mg/dL (ref 0.40–1.20)
GFR: 83.07 mL/min (ref >60.00)
Glucose, Bld: 87 mg/dL (ref 70–99)
Potassium: 4.3 meq/L (ref 3.5–5.1)
Sodium: 140 meq/L (ref 135–145)
Total Bilirubin: 0.3 mg/dL (ref 0.2–1.2)
Total Protein: 6.8 g/dL (ref 6.0–8.3)

## 2023-12-31 LAB — CBC WITH DIFFERENTIAL/PLATELET
Basophils Absolute: 0 10*3/uL (ref 0.0–0.1)
Basophils Relative: 0.5 % (ref 0.0–3.0)
Eosinophils Absolute: 0.2 10*3/uL (ref 0.0–0.7)
Eosinophils Relative: 6 % — ABNORMAL HIGH (ref 0.0–5.0)
HCT: 41.7 % (ref 36.0–46.0)
Hemoglobin: 13.8 g/dL (ref 12.0–15.0)
Lymphocytes Relative: 34.9 % (ref 12.0–46.0)
Lymphs Abs: 1.1 10*3/uL (ref 0.7–4.0)
MCHC: 33.1 g/dL (ref 30.0–36.0)
MCV: 88.5 fl (ref 78.0–100.0)
Monocytes Absolute: 0.4 10*3/uL (ref 0.1–1.0)
Monocytes Relative: 12.9 % — ABNORMAL HIGH (ref 3.0–12.0)
Neutro Abs: 1.4 10*3/uL (ref 1.4–7.7)
Neutrophils Relative %: 45.7 % (ref 43.0–77.0)
Platelets: 282 10*3/uL (ref 150.0–400.0)
RBC: 4.71 Mil/uL (ref 3.87–5.11)
RDW: 12.8 % (ref 11.5–15.5)
WBC: 3.1 10*3/uL — ABNORMAL LOW (ref 4.0–10.5)

## 2023-12-31 LAB — GAMMA GT: GGT: 15 U/L (ref 7–51)

## 2023-12-31 LAB — LIPASE: Lipase: 44 U/L (ref 11.0–59.0)

## 2023-12-31 NOTE — Patient Instructions (Signed)
 An order for CT was placed.  They will contact you about setting up this appointment.  If you develop worsening symptoms proceed to nearest ED.

## 2023-12-31 NOTE — Progress Notes (Addendum)
 Established Patient Office Visit   Subjective  Patient ID: [name] [name], female    DOB: 1991-02-12  Age: 33 y.o. MRN: [id number]  Chief Complaint  Patient presents with   Abdominal Pain    Started 1 week ago, left side flank pain, Body aches and chills,diarrhea and vomiting started Friday, seen in ER on Sunday,    Pt accompanied by her partner Agricola. Patient is a 33 year old female followed by Dr. Bambi Lever and seen for ED follow-up/acute concern.  Patient endorses being seen in ED 12/30/2023 for abdominal pain x 1 week, diarrhea, emesis, body aches, chills, subjective fever x 3 days.  In ED POC viral panel, urine hCG, and UA negative.  Patient states blood was drawn but she did not see any results.  Patient endorses difficulty staying hydrated, right flank pain and RUQ pain.  Initially noted RUQ pain a few weeks ago shortly after eating.  Typically drinks 4-5 bottles of water per day, 4 large cups of coffee per day, 1-2 ginger ale's, lemonade and juice daily before getting sick.  Patient has a history of kidney stones several years ago.  Patient has her gallbladder and appendix in place.    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/16/2009   Other atopic dermatitis 07/29/2007   Past Medical History:  Diagnosis Date   Allergic rhinitis    Allergy     Asthma    hosp 06/2009   Chicken pox    Eczema    Kidney stone    right ER 09/17/09, 707/2012   Past Surgical History:  Procedure Laterality Date   dental extractions     TARSAL METATARSAL ARTHRODESIS Left 11/2021   foot   Social History   Tobacco Use    Smoking status: Former    Current packs/day: 0.00    Types: Cigarettes    Quit date: 2019    Years since quitting: 6.3   Smokeless tobacco: Never   Tobacco comments:    Stopped 2 months ago   Vaping Use   Vaping status: Never Used  Substance Use Topics   Alcohol use: Yes    Comment: wine, liquor,beer on weekends   Drug use: No   Family History  Problem Relation Age of Onset   Asthma Mother    Eczema Mother    Drug abuse Maternal Grandmother    Arthritis Maternal Grandmother    Alcohol abuse Maternal Grandfather    Drug abuse Maternal Grandfather    Early death Paternal Grandmother    Arthritis Paternal Grandmother    Asthma Other        uncle   Eczema Other        uncle   Allergies  Allergen Reactions   Peanut-Containing Drug Products Anaphylaxis    REACTION: unspecified   Shrimp [Shellfish Allergy ] Hives    Tolerates shrimp, avoids all other shellfish.   Other     TREE NUTS   Latex Hives   Penicillins Hives      ROS Negative unless stated above    Objective:     BP 120/77 (BP Location: Left Arm, Patient Position: Sitting, Cuff Size: Normal)   Pulse 77  Temp 97.7 F (36.5 C) (Oral)   Ht 5\' 8"  (1.727 m)   Wt 273 lb 12.8 oz (124.2 kg)   SpO2 98%   BMI 41.63 kg/m  BP Readings from Last 3 Encounters:  12/31/23 120/77  12/30/23 105/65  09/03/23 122/72   Wt Readings from Last 3 Encounters:  12/31/23 273 lb 12.8 oz (124.2 kg)  05/18/23 282 lb 4.8 oz (128.1 kg)  02/15/23 (!) 310 lb (140.6 kg)      Physical Exam Constitutional:      General: She is not in acute distress.    Appearance: Normal appearance.  HENT:     Head: Normocephalic and atraumatic.     Nose: Nose normal.     Mouth/Throat:     Mouth: Mucous membranes are moist.  Cardiovascular:     Rate and Rhythm: Normal rate and regular rhythm.     Heart sounds: Normal heart sounds. No murmur heard.    No gallop.  Pulmonary:     Effort: Pulmonary effort is normal. No respiratory  distress.     Breath sounds: Normal breath sounds. No wheezing, rhonchi or rales.  Abdominal:     General: Bowel sounds are normal.     Palpations: Abdomen is soft. There is no hepatomegaly.     Tenderness: There is abdominal tenderness in the right upper quadrant and right lower quadrant. There is rebound. There is no right CVA tenderness, left CVA tenderness or guarding.  Skin:    General: Skin is warm and dry.  Neurological:     Mental Status: She is alert and oriented to person, place, and time.        05/18/2023    8:04 AM  Depression screen PHQ 2/9  Decreased Interest 0  Down, Depressed, Hopeless 0  PHQ - 2 Score 0  Altered sleeping 0  Tired, decreased energy 0  Change in appetite 1  Feeling bad or failure about yourself  0  Trouble concentrating 0  Moving slowly or fidgety/restless 0  Suicidal thoughts 0  PHQ-9 Score 1       No data to display           No results found for any visits on 12/31/23.    Assessment & Plan:  Right upper quadrant abdominal pain -     Lipase -     Comprehensive metabolic panel with GFR -     CBC with Differential/Platelet -     Gamma GT -     CT ABDOMEN PELVIS WO CONTRAST; Future  Right flank pain -     Comprehensive metabolic panel with GFR -     CBC with Differential/Platelet -     CT ABDOMEN PELVIS WO CONTRAST; Future  Acute right lower quadrant pain -     Comprehensive metabolic panel with GFR -     CBC with Differential/Platelet -     CT ABDOMEN PELVIS WO CONTRAST; Future  Nausea and vomiting, unspecified vomiting type  Diarrhea, unspecified type  History of renal calculi  Patient with flank pain, RUQ and RLQ pain concerning for cholecystitis, renal calculi, appendicitis.  Also consider peptic ulcer, viral etiology, diverticulitis.  Will obtain labs and imaging.  Further recommendations based on results.  Discussed the importance of hydration.  Patient to take small sips of fluids throughout the day along with  Zofran  as needed.  Patient given strict ED precautions.  Return if symptoms worsen or fail to improve.   Viola Greulich, MD

## 2023-12-31 NOTE — Telephone Encounter (Signed)
 Noted- ok to close.

## 2023-12-31 NOTE — Telephone Encounter (Signed)
 Patient was scheduled for an appt today with Dr Arliss Lam at 10am.

## 2023-12-31 NOTE — Telephone Encounter (Signed)
  Chief Complaint: h/o N/V/D Symptoms: none today was seen in ED but is cont to have abd pain 6/10 and occ. lightheadedness Frequency: Monday through Sat Pertinent Negatives: Patient denies diarrhea since Sat Disposition: [] ED /[] Urgent Care (no appt availability in office) / [x] Appointment(In office/virtual)/ []  [place] [facility]/ [] Home Care/ [] Refused Recommended Disposition /[] [place] [facility]/ []  Follow-up with PCP Additional Notes:  Copied from CRM [id number]. Topic: Clinical - Red Word Triage >> Dec 31, 2023  9:09 AM Jorie Newness J wrote: Red Word that prompted transfer to Nurse Triage: Pt has diarrhea and severe stomach pains. She needs a hospital f/u but she is experiencing symptoms Reason for Disposition  Abdominal pain  (Exception: Pain clears with each passage of diarrhea stool.)  Answer Assessment - Initial Assessment Questions 1. DIARRHEA SEVERITY: "How bad is the diarrhea?" "How many more stools have you had in the past 24 hours than normal?"    - NO DIARRHEA (SCALE 0)   - MILD (SCALE 1-3): Few loose or mushy BMs; increase of 1-3 stools over normal daily number of stools; mild increase in ostomy output.   -  MODERATE (SCALE 4-7): Increase of 4-6 stools daily over normal; moderate increase in ostomy output.   -  SEVERE (SCALE 8-10; OR "WORST POSSIBLE"): Increase of 7 or more stools daily over normal; moderate increase in ostomy output; incontinence.     No severe diarrhea since Sat  2. ONSET: "When did the diarrhea begin?"      Last Monday  3. BM CONSISTENCY: "How loose or watery is the diarrhea?"  None since Sat  4. VOMITING: "Are you also vomiting?" If Yes, ask: "How many times in the past 24 hours?"      Yesterday  5. ABDOMEN PAIN: "Are you having any abdomen pain?" If Yes, ask: "What does it feel like?" (e.g., crampy, dull, intermittent, constant)      yes 6. ABDOMEN PAIN SEVERITY: If present, ask: "How bad is the pain?"  (e.g., Scale 1-10; mild, moderate, or  severe)   - MILD (1-3): doesn't interfere with normal activities, abdomen soft and not tender to touch    - MODERATE (4-7): interferes with normal activities or awakens from sleep, abdomen tender to touch    - SEVERE (8-10): excruciating pain, doubled over, unable to do any normal activities       6/10 NPO  7. ORAL INTAKE: If vomiting, "Have you been able to drink liquids?" "How much liquids have you had in the past 24 hours?"     Has not drank any today  8. HYDRATION: "Any signs of dehydration?" (e.g., dry mouth [not just dry lips], too weak to stand, dizziness, new weight loss) "When did you last urinate?"   Lightheadedness  11. OTHER SYMPTOMS: "Do you have any other symptoms?" (e.g., fever, blood in stool)       no  Protocols used: Diarrhea-A-AH

## 2024-01-01 ENCOUNTER — Emergency Department (HOSPITAL_COMMUNITY)
Admission: EM | Admit: 2024-01-01 | Discharge: 2024-01-01 | Disposition: A | Discharge status: Home / Self Care | Attending: Emergency Medicine | Admitting: Emergency Medicine

## 2024-01-01 ENCOUNTER — Emergency Department (HOSPITAL_COMMUNITY)

## 2024-01-01 ENCOUNTER — Other Ambulatory Visit: Payer: Self-pay

## 2024-01-01 ENCOUNTER — Encounter (HOSPITAL_COMMUNITY): Payer: Self-pay

## 2024-01-01 DIAGNOSIS — R112 Nausea with vomiting, unspecified: Secondary | ICD-10-CM | POA: Insufficient documentation

## 2024-01-01 DIAGNOSIS — R197 Diarrhea, unspecified: Secondary | ICD-10-CM | POA: Insufficient documentation

## 2024-01-01 DIAGNOSIS — Z91010 Allergy to peanuts: Secondary | ICD-10-CM | POA: Diagnosis not present

## 2024-01-01 DIAGNOSIS — R1084 Generalized abdominal pain: Secondary | ICD-10-CM | POA: Insufficient documentation

## 2024-01-01 DIAGNOSIS — Z91040 Latex allergy status: Secondary | ICD-10-CM | POA: Insufficient documentation

## 2024-01-01 LAB — URINALYSIS, ROUTINE W REFLEX MICROSCOPIC
Bilirubin Urine: NEGATIVE
Glucose, UA: NEGATIVE mg/dL
Hgb urine dipstick: NEGATIVE
Ketones, ur: NEGATIVE mg/dL
Leukocytes,Ua: NEGATIVE
Nitrite: NEGATIVE
Protein, ur: NEGATIVE mg/dL
Specific Gravity, Urine: 1.025 (ref 1.005–1.030)
pH: 5 (ref 5.0–8.0)

## 2024-01-01 LAB — CBC
HCT: 40.3 % (ref 36.0–46.0)
Hemoglobin: 13.3 g/dL (ref 12.0–15.0)
MCH: 29.4 pg (ref 26.0–34.0)
MCHC: 33 g/dL (ref 30.0–36.0)
MCV: 89.2 fL (ref 80.0–100.0)
Platelets: 255 10*3/uL (ref 150–400)
RBC: 4.52 MIL/uL (ref 3.87–5.11)
RDW: 11.8 % (ref 11.5–15.5)
WBC: 4.1 10*3/uL (ref 4.0–10.5)
nRBC: 0 % (ref 0.0–0.2)

## 2024-01-01 LAB — COMPREHENSIVE METABOLIC PANEL WITH GFR
ALT: 15 U/L (ref 0–44)
AST: 16 U/L (ref 15–41)
Albumin: 3.2 g/dL — ABNORMAL LOW (ref 3.5–5.0)
Alkaline Phosphatase: 51 U/L (ref 38–126)
Anion gap: 6 (ref 5–15)
BUN: 12 mg/dL (ref 6–20)
CO2: 23 mmol/L (ref 22–32)
Calcium: 8.3 mg/dL — ABNORMAL LOW (ref 8.9–10.3)
Chloride: 106 mmol/L (ref 98–111)
Creatinine, Ser: 0.53 mg/dL (ref 0.44–1.00)
GFR, Estimated: >60 mL/min (ref >60)
Glucose, Bld: 90 mg/dL (ref 70–99)
Potassium: 3.8 mmol/L (ref 3.5–5.1)
Sodium: 135 mmol/L (ref 135–145)
Total Bilirubin: 0.3 mg/dL (ref 0.0–1.2)
Total Protein: 6.7 g/dL (ref 6.5–8.1)

## 2024-01-01 LAB — HCG, SERUM, QUALITATIVE: Preg, Serum: NEGATIVE

## 2024-01-01 LAB — LIPASE, BLOOD: Lipase: 33 U/L (ref 11–51)

## 2024-01-01 MED ORDER — FAMOTIDINE IN NACL 20-0.9 MG/50ML-% IV SOLN
20.0000 mg | Freq: Once | INTRAVENOUS | Status: AC
  Administered 2024-01-01: 20 mg via INTRAVENOUS
  Filled 2024-01-01: qty 50

## 2024-01-01 MED ORDER — ONDANSETRON 4 MG PO TBDP: 4.0000 mg | ORAL_TABLET | Freq: Three times a day (TID) | ORAL | 0 refills | Status: AC | PRN

## 2024-01-01 MED ORDER — DICYCLOMINE HCL 10 MG PO CAPS
20.0000 mg | ORAL_CAPSULE | Freq: Once | ORAL | Status: AC
  Administered 2024-01-01: 20 mg via ORAL
  Filled 2024-01-01: qty 2

## 2024-01-01 MED ORDER — DICYCLOMINE HCL 20 MG PO TABS: 20.0000 mg | ORAL_TABLET | Freq: Two times a day (BID) | ORAL | 0 refills | Status: AC

## 2024-01-01 MED ORDER — LACTATED RINGERS IV BOLUS
1000.0000 mL | Freq: Once | INTRAVENOUS | Status: AC
  Administered 2024-01-01: 1000 mL via INTRAVENOUS

## 2024-01-01 NOTE — Discharge Instructions (Addendum)
 Use zofran  as needed for nausea up to every 6 hours.  Hydrate, eat bland foods. Follow up with you primary care provider.

## 2024-01-01 NOTE — ED Provider Notes (Signed)
 [place] EMERGENCY DEPARTMENT AT [facility] Provider Note   CSN: [id number] Arrival date & time: 01/01/24  4098     History  No chief complaint on file.   [name] [name] is a 33 y.o. female.  HPI  Patient is a 33 year old female with no pertinent past medical history presents emergency room today with complaints of 1 week of nausea vomiting diarrhea.  Seems that her symptoms started last Monday and initially improved however she has now still having some nausea and some diarrhea.  She also endorses some generalized abdominal pain.  Denies any fevers at home no urinary frequency urgency dysuria or hematuria.  No blood in her stools.  She had an episode of vomiting today but otherwise states that she has not had significant vomiting episodes over the past few days.  No international travel, no recent antibiotic use.  No other significant symptoms apart from feeling somewhat fatigued.     Home Medications Prior to Admission medications   Medication Sig Start Date End Date Taking? Authorizing Provider  dicyclomine (BENTYL) 20 MG tablet Take 1 tablet (20 mg total) by mouth 2 (two) times daily. 01/01/24  Yes Gregoria Selvy S, PA  ondansetron  (ZOFRAN -ODT) 4 MG disintegrating tablet Take 1 tablet (4 mg total) by mouth every 8 (eight) hours as needed for nausea or vomiting. 01/01/24  Yes Myleka Moncure S, PA  albuterol  (PROVENTIL ) (2.5 MG/3ML) 0.083% nebulizer solution Take 3 mLs (2.5 mg total) by nebulization every 6 (six) hours as needed for wheezing or shortness of breath. 09/03/23   Sean Czar, MD  albuterol  (VENTOLIN  HFA) 108 [id number] Base) MCG/ACT inhaler Inhale 1-2 puffs into the lungs every 6 (six) hours as needed for wheezing or shortness of breath. 09/03/23   Sean Czar, MD  Azelastine -Fluticasone  (DYMISTA ) 137-50 MCG/ACT SUSP Place 1 spray into both nostrils 2 (two) times daily as needed. 09/03/23   Sean Czar, MD  Crisaborole  (EUCRISA ) 2 % OINT 1 application 2  times daily on hands AS Needed to red itchy areas. 08/02/22   Sean Czar, MD  dupilumab  (DUPIXENT ) 300 MG/2ML prefilled syringe Inject 300 mg into the skin every 14 (fourteen) days. 08/14/22   Trudy Fusi, DO  EPINEPHrine  0.3 mg/0.3 mL IJ SOAJ injection Inject 0.3 mg into the muscle as needed for anaphylaxis. 09/03/23   Sean Czar, MD  fluticasone  (FLONASE ) 50 MCG/ACT nasal spray Place 2 sprays into both nostrils daily. Place 2 sprays in each nostril once a day as needed for stuffy nose 02/07/23   Sean Czar, MD  fluticasone -salmeterol (WIXELA INHUB) 250-50 MCG/ACT AEPB Inhale 1 puff into the lungs daily in the afternoon. For Asthma flares increase to Wixela 250/50 1 puff twice daily to help prevent cough and wheeze. Rinse mouth after each use. 09/03/23   Sean Czar, MD  hydrochlorothiazide  (HYDRODIURIL ) 25 MG tablet Take 25 mg by mouth daily. 05/10/23   [provider]  hydrocortisone  2.5 % ointment Apply topically 2 (two) times daily. May use on the face up to 7 days at a time. 08/02/22   Sean Czar, MD  loperamide (IMODIUM) 2 MG capsule Take 1 capsule (2 mg total) by mouth 4 (four) times daily as needed for diarrhea or loose stools. 12/30/23   Zackowski, Scott, MD  loratadine  (CLARITIN ) 10 MG tablet Take 1 tablet (10 mg total) by mouth daily. 02/07/23   Sean Czar, MD  telmisartan  (MICARDIS ) 20 MG tablet Take 1 tablet (20  mg total) by mouth daily. 06/28/23   Aida House, MD  tirzepatide  (ZEPBOUND ) 10 MG/0.5ML Pen Inject 10 mg into the skin once a week. 05/18/23   Aida House, MD  tirzepatide  (ZEPBOUND ) 12.5 MG/0.5ML Pen Inject 12.5 mg into the skin once a week. 05/18/23   Aida House, MD  tirzepatide  (ZEPBOUND ) 15 MG/0.5ML Pen Inject 15 mg into the skin once a week. 05/18/23   Aida House, MD  triamcinolone  ointment (KENALOG ) 0.1 % Apply 1 Application topically 2 (two) times daily as needed. Do not use on the face, neck, armpits or groin area. 09/03/23    Sean Czar, MD  Vitamin D , Ergocalciferol , (DRISDOL ) 1.25 MG (50000 UNIT) CAPS capsule TAKE 1 CAPSULE (50,000 UNITS TOTAL) BY MOUTH EVERY 7 (SEVEN) DAYS 07/06/23   Aida House, MD      Allergies    Peanut-containing drug products, Shrimp [shellfish allergy ], Other, Latex, and Penicillins    Review of Systems   Review of Systems  Physical Exam Updated Vital Signs BP 138/86   Pulse (!) 57   Temp 98.1 F (36.7 C) (Oral)   Resp 18   SpO2 100%  Physical Exam Vitals and nursing note reviewed.  Constitutional:      General: She is not in acute distress. HENT:     Head: Normocephalic and atraumatic.     Nose: Nose normal.     Mouth/Throat:     Mouth: Mucous membranes are dry.  Eyes:     General: No scleral icterus. Cardiovascular:     Rate and Rhythm: Normal rate and regular rhythm.     Pulses: Normal pulses.     Heart sounds: Normal heart sounds.  Pulmonary:     Effort: Pulmonary effort is normal. No respiratory distress.     Breath sounds: No wheezing.  Abdominal:     Palpations: Abdomen is soft.     Tenderness: There is abdominal tenderness. There is no guarding or rebound.     Comments: Some epigastric tenderness.  No guarding or rebound.  Musculoskeletal:     Cervical back: Normal range of motion.     Right lower leg: No edema.     Left lower leg: No edema.  Skin:    General: Skin is warm and dry.     Capillary Refill: Capillary refill takes less than 2 seconds.  Neurological:     Mental Status: She is alert. Mental status is at baseline.  Psychiatric:        Mood and Affect: Mood normal.        Behavior: Behavior normal.     ED Results / Procedures / Treatments   Labs (all labs ordered are listed, but only abnormal results are displayed) Labs Reviewed  COMPREHENSIVE METABOLIC PANEL WITH GFR - Abnormal; Notable for the following components:      Result Value   Calcium 8.3 (*)    Albumin 3.2 (*)    All other components within normal limits   LIPASE, BLOOD  CBC  URINALYSIS, ROUTINE W REFLEX MICROSCOPIC  HCG, SERUM, QUALITATIVE    EKG None  Radiology US  Abdomen Limited RUQ (LIVER/GB) Result Date: 01/01/2024 CLINICAL DATA:  151470 RUQ abdominal pain [id number] EXAM: ULTRASOUND ABDOMEN LIMITED RIGHT UPPER QUADRANT COMPARISON:  August 03, 2015 FINDINGS: Gallbladder: No gallstones. No wall thickening or pericholecystic fluid. No sonographic Murphy's sign noted by sonographer. Common bile duct: Diameter: 4 mm Liver: Normal echogenicity. No focal lesion identified. No intrahepatic biliary ductal dilation. Portal  vein is patent on color Doppler imaging with normal direction of blood flow towards the liver. Other: None. IMPRESSION: No cholecystolithiasis or changes of acute cholecystitis. Electronically Signed   By: Rance Burrows M.D.   On: 01/01/2024 13:39    Procedures Procedures    Medications Ordered in ED Medications  dicyclomine (BENTYL) capsule 20 mg (20 mg Oral Given 01/01/24 1009)  famotidine (PEPCID) IVPB 20 mg premix (0 mg Intravenous Stopped 01/01/24 1109)  lactated ringers  bolus 1,000 mL (0 mLs Intravenous Stopped 01/01/24 1412)    ED Course/ Medical Decision Making/ A&P Clinical Course as of 01/01/24 1421  Tue Jan 01, 2024  0931 One week of nausea/vomiting/diarrhea.   [WF]    Clinical Course User Index [WF] Coretta Dexter, [place]                                 Medical Decision Making Amount and/or Complexity of Data Reviewed Labs: ordered. Radiology: ordered.  Risk Prescription drug management.   This patient presents to the ED for concern of abd pain, this involves a number of treatment options, and is a complaint that carries with it a moderate risk of complications and morbidity. A differential diagnosis was considered for the patient's symptoms which is discussed below:   The causes of generalized abdominal pain include but are not limited to AAA, mesenteric ischemia, appendicitis, diverticulitis, DKA,  gastritis, gastroenteritis, AMI, nephrolithiasis, pancreatitis, peritonitis, adrenal insufficiency,lead poisoning, iron toxicity, intestinal ischemia, constipation, UTI,SBO/LBO, splenic rupture, biliary disease, IBD, IBS, PUD, or hepatitis.   Co morbidities: Discussed in HPI   Brief History:  Patient is a 33 year old female with no pertinent past medical history presents emergency room today with complaints of 1 week of nausea vomiting diarrhea.  Seems that her symptoms started last Monday and initially improved however she has now still having some nausea and some diarrhea.  She also endorses some generalized abdominal pain.  Denies any fevers at home no urinary frequency urgency dysuria or hematuria.  No blood in her stools.  She had an episode of vomiting today but otherwise states that she has not had significant vomiting episodes over the past few days.  No international travel, no recent antibiotic use.  No other significant symptoms apart from feeling somewhat fatigued.    EMR reviewed including pt PMHx, past surgical history and past visits to ER.   See HPI for more details   Lab Tests:  I personally reviewed all laboratory work and imaging. Metabolic panel without any acute abnormality specifically kidney function within normal limits and no significant electrolyte abnormalities. CBC without leukocytosis or significant anemia.   Imaging Studies:  NAD. I personally reviewed all imaging studies and no acute abnormality found. I agree with radiology interpretation.  No cholecystitis or biliary stones evident  Cardiac Monitoring:      Medicines ordered:  I ordered medication including lactated Ringer 's, Pepcid, Bentyl for pain Reevaluation of the patient after these medicines showed that the patient resolved I have reviewed the patients home medicines and have made adjustments as needed   Critical Interventions:     Consults/Attending  Physician      Reevaluation:  After the interventions noted above I re-evaluated patient and found that they have :resolved   Social Determinants of Health:      Problem List / ED Course:  Nausea vomiting abdominal pain and diarrhea approximately 1 week of symptoms.  Reassuringly normal vital signs  and labs.  She is seen a primary care provider who is doing some additional workup No fevers at home.  No blood in stool.  She feels much improved after Bentyl Pepcid here.  Will discharge home with Zofran  and Bentyl.  Return precautions discussed   Dispostion:  After consideration of the diagnostic results and the patients response to treatment, I feel that the patent would benefit from outpatient follow-up.   Final Clinical Impression(s) / ED Diagnoses Final diagnoses:  Generalized abdominal pain    Rx / DC Orders ED Discharge Orders          Ordered    dicyclomine (BENTYL) 20 MG tablet  2 times daily        01/01/24 1351    ondansetron  (ZOFRAN -ODT) 4 MG disintegrating tablet  Every 8 hours PRN        01/01/24 1351              Ofelia Bent [place], [place] 01/01/24 1423    Deatra Face, MD 01/01/24 1445

## 2024-01-01 NOTE — ED Triage Notes (Signed)
 Patient presented to ER for N/V/D for the past week. Patient was seen Sunday at an ER and Monday at her primary care for this. Patient states her PCP ordered a CT of her abdomen but she has not heart from anyone on where to go for this CT. Pain and nausea not improving so instructed to come to ER.

## 2024-01-01 NOTE — ED Notes (Signed)
 Pt aware urine sample needed. Unable to provide at this time.

## 2024-01-02 ENCOUNTER — Ambulatory Visit: Admitting: Family Medicine

## 2024-01-02 ENCOUNTER — Encounter: Payer: Self-pay | Admitting: Family Medicine

## 2024-01-02 ENCOUNTER — Ambulatory Visit: Payer: Self-pay

## 2024-01-02 VITALS — BP 118/76 | HR 74 | Temp 98.1°F | Wt 276.4 lb

## 2024-01-02 DIAGNOSIS — R1011 Right upper quadrant pain: Secondary | ICD-10-CM | POA: Diagnosis not present

## 2024-01-02 DIAGNOSIS — R112 Nausea with vomiting, unspecified: Secondary | ICD-10-CM | POA: Diagnosis not present

## 2024-01-02 NOTE — Telephone Encounter (Signed)
 Patient was scheduled for an appt today with Dr Darren Em.

## 2024-01-02 NOTE — Progress Notes (Signed)
 Established Patient Office Visit  Subjective   Patient ID: [name] [name], female    DOB: July 21, 1991  Age: 33 y.o. MRN: [id number]  Chief Complaint  Patient presents with   Abdominal Pain    Patient complains of abdominal pain, x1 week    Dizziness    Patient complains of dizziness, x1 week    Emesis    Patient complains of emesis, x6 days    Constipation    Patient complains of constipation, x6 days     HPI    [name] [name] is seen today with some ongoing abdominal pain mostly right upper quadrant.  She has had some other nonspecific symptoms including lightheadedness, intermittent nausea, and some constipation.  She states she had recent diarrhea and took some Imodium and has been constipated now for couple days since then.  She has had several visits in regard to her abdominal pain.  Was seen for evaluation in the ER on May 4, our office May 5, and ER visit same day on the fifth.  Has had multiple recent labs including lipase, urinalysis, CBC, pregnancy test,'s comprehensive metabolic panel all basically unremarkable.  Limited ultrasound right upper quadrant done in the ER yesterday showed no gallstones or gallbladder wall thickening.  Liver was normal.  Patient has pending CT abdomen pelvis which was ordered by Dr. Arliss Lam.  She denies any fever.  She was given Zofran  from ER.  She does relate that pain seems to be worse after eating.  Denies any history of peptic ulcer disease.  No chronic nonsteroidal use.  No recent melena.  No hematemesis.  Does have occasional GERD symptoms.  She is requesting work note for today  Past Medical History:  Diagnosis Date   Allergic rhinitis    Allergy     Asthma    hosp 06/2009   Chicken pox    Eczema    Kidney stone    right ER 09/17/09, 707/2012   Past Surgical History:  Procedure Laterality Date   dental extractions     TARSAL METATARSAL ARTHRODESIS Left 11/2021   foot    reports that she quit smoking about 6 years ago. Her  smoking use included cigarettes. She has never used smokeless tobacco. She reports current alcohol use. She reports that she does not use drugs. family history includes Alcohol abuse in her maternal grandfather; Arthritis in her maternal grandmother and paternal grandmother; Asthma in her mother and another family member; Drug abuse in her maternal grandfather and maternal grandmother; Early death in her paternal grandmother; Eczema in her mother and another family member. Allergies  Allergen Reactions   Peanut-Containing Drug Products Anaphylaxis    REACTION: unspecified   Shrimp [Shellfish Allergy ] Hives    Tolerates shrimp, avoids all other shellfish.   Other     TREE NUTS   Latex Hives   Penicillins Hives    Review of Systems  Constitutional:  Negative for chills, fever and weight loss.  Respiratory:  Negative for shortness of breath.   Cardiovascular:  Negative for chest pain.  Gastrointestinal:  Positive for abdominal pain, constipation and nausea. Negative for blood in stool and melena.  Genitourinary:  Negative for dysuria and flank pain.  Neurological:  Positive for dizziness. Negative for headaches.      Objective:     BP 118/76 (BP Location: Left Arm, Patient Position: Sitting, Cuff Size: Large)   Pulse 74   Temp 98.1 F (36.7 C) (Oral)   Wt 276 lb 6.4 oz (125.4 kg)  SpO2 97%   BMI 42.03 kg/m  BP Readings from Last 3 Encounters:  01/02/24 118/76  01/01/24 138/86  12/31/23 120/77   Wt Readings from Last 3 Encounters:  01/02/24 276 lb 6.4 oz (125.4 kg)  12/31/23 273 lb 12.8 oz (124.2 kg)  05/18/23 282 lb 4.8 oz (128.1 kg)      Physical Exam Vitals reviewed.  Constitutional:      General: She is not in acute distress.    Appearance: She is not ill-appearing.  Cardiovascular:     Rate and Rhythm: Normal rate and regular rhythm.  Pulmonary:     Effort: Pulmonary effort is normal.     Breath sounds: Normal breath sounds.  Abdominal:     Comments:  Nondistended.  Normal bowel sounds.  Soft and nontender.  No guarding.  Skin:    Findings: No rash.  Neurological:     General: No focal deficit present.     Mental Status: She is alert.      No results found for any visits on 01/02/24.  Last CBC Lab Results  Component Value Date   WBC 4.1 01/01/2024   HGB 13.3 01/01/2024   HCT 40.3 01/01/2024   MCV 89.2 01/01/2024   MCH 29.4 01/01/2024   RDW 11.8 01/01/2024   PLT 255 01/01/2024   Last metabolic panel Lab Results  Component Value Date   GLUCOSE 90 01/01/2024   NA 135 01/01/2024   K 3.8 01/01/2024   CL 106 01/01/2024   CO2 23 01/01/2024   BUN 12 01/01/2024   CREATININE 0.53 01/01/2024   GFRNONAA >60 01/01/2024   CALCIUM 8.3 (L) 01/01/2024   PROT 6.7 01/01/2024   ALBUMIN 3.2 (L) 01/01/2024   BILITOT 0.3 01/01/2024   ALKPHOS 51 01/01/2024   AST 16 01/01/2024   ALT 15 01/01/2024   ANIONGAP 6 01/01/2024      The ASCVD Risk score (Arnett DK, et al., 2019) failed to calculate for the following reasons:   The 2019 ASCVD risk score is only valid for ages 26 to 41    Assessment & Plan:   [name] [name] has had 1 week history of symptoms including abdominal pain mostly right upper quadrant, intermittent nausea, nonspecific lightheadedness, and some recent diarrhea now followed by constipation after taking Imodium.  Ultrasound showed no gallstones.  Recent labs unremarkable.  CT abdomen and pelvis pending.  Nonfocal exam and nonacute abdomen at this time.  Etiology unclear.  -Work note provided for today and tomorrow - We are calling to see if we can get CT scan scheduled sooner - Avoid fatty foods and spicy foods for now - Consider trial of over-the-counter Pepcid or Nexium/Prilosec especially for any GERD symptoms - If above unrevealing and she continues to have postprandial right upper quadrant pain consider HIDA scan - Follow-up immediately for any fever, recurrent vomiting, worsening abdominal pain, or other  concerns  Spent over 30 minutes between reviewing multiple recent ER visits,, labs, x-rays, office visit from the fifth, and recent symptomatology in combination with face-to-face time today in office  Glean Lamy, MD

## 2024-01-02 NOTE — Telephone Encounter (Signed)
 Noted- ok to close.

## 2024-01-02 NOTE — Telephone Encounter (Signed)
 Copied from CRM [id number]. Topic: Clinical - Red Word Triage >> Jan 02, 2024  8:18 AM Alethia Huxley E wrote: [facility] that prompted transfer to Nurse Triage: Stomach pain. Patient has been experiencing severe stomach pain since last week Monday. Patient rated the pain a level 10 out of 10.  Chief Complaint: abd pain, was seen in ER over weekend Symptoms: pain, nausea Frequency: constant Pertinent Negatives: Patient denies fever, vomiting Disposition: [] ED /[] Urgent Care (no appt availability in office) / [x] Appointment(In office/virtual)/ []  [place] [facility]/ [] Home Care/ [] Refused Recommended Disposition /[] [place] [facility]/ []  Follow-up with PCP Additional Notes: apt made per protocol; care advice given, denies questions; instructed to go to ER if becomes worse.   Reason for Disposition  [1] MILD-MODERATE pain AND [2] constant AND [3] present > 2 hours  Answer Assessment - Initial Assessment Questions 1. LOCATION: "Where does it hurt?"      Upper right side 2. RADIATION: "Does the pain shoot anywhere else?" (e.g., chest, back)     Chest and upper right shoulder 3. ONSET: "When did the pain begin?" (e.g., minutes, hours or days ago)      Last Monday 4. SUDDEN: "Gradual or sudden onset?"     gradual 5. PATTERN "Does the pain come and go, or is it constant?"    - If it comes and goes: "How long does it last?" "Do you have pain now?"     (Note: Comes and goes means the pain is intermittent. It goes away completely between bouts.)    - If constant: "Is it getting better, staying the same, or getting worse?"      (Note: Constant means the pain never goes away completely; most serious pain is constant and gets worse.)      constant 6. SEVERITY: "How bad is the pain?"  (e.g., Scale 1-10; mild, moderate, or severe)    - MILD (1-3): Doesn't interfere with normal activities, abdomen soft and not tender to touch.     - MODERATE (4-7): Interferes with normal activities or awakens from  sleep, abdomen tender to touch.     - SEVERE (8-10): Excruciating pain, doubled over, unable to do any normal activities.       7-8/10 7. RECURRENT SYMPTOM: "Have you ever had this type of stomach pain before?" If Yes, ask: "When was the last time?" and "What happened that time?"      yes 8. CAUSE: "What do you think is causing the stomach pain?"     Spasm ER dr. said 9. RELIEVING/AGGRAVATING FACTORS: "What makes it better or worse?" (e.g., antacids, bending or twisting motion, bowel movement)     zofran  10. OTHER SYMPTOMS: "Do you have any other symptoms?" (e.g., back pain, diarrhea, fever, urination pain, vomiting)       nausea 11. PREGNANCY: "Is there any chance you are pregnant?" "When was your last menstrual period?"       na  Protocols used: Abdominal Pain - [facility]

## 2024-01-03 ENCOUNTER — Encounter: Payer: Self-pay | Admitting: Family Medicine

## 2024-01-03 ENCOUNTER — Ambulatory Visit
Admission: RE | Admit: 2024-01-03 | Discharge: 2024-01-03 | Disposition: A | Discharge status: Home / Self Care | Source: Ambulatory Visit | Attending: Family Medicine | Admitting: Family Medicine

## 2024-01-03 DIAGNOSIS — R1011 Right upper quadrant pain: Secondary | ICD-10-CM

## 2024-01-03 DIAGNOSIS — R109 Unspecified abdominal pain: Secondary | ICD-10-CM

## 2024-01-03 DIAGNOSIS — R1031 Right lower quadrant pain: Secondary | ICD-10-CM

## 2024-01-03 NOTE — Telephone Encounter (Signed)
 I have ordered HIDA scan to rule out biliary dyskinesis.  If this is normal, and pain persists, I think next step is GI referral.  Marquetta Sit MD  Primary Care at [facility]

## 2024-01-09 NOTE — Telephone Encounter (Signed)
 Unable to take patient out of work.  Sounds like she would benefit from an appointment with her PCP.

## 2024-01-16 ENCOUNTER — Ambulatory Visit (HOSPITAL_COMMUNITY)
Admission: RE | Admit: 2024-01-16 | Discharge: 2024-01-16 | Disposition: A | Discharge status: Home / Self Care | Source: Ambulatory Visit | Attending: Family Medicine | Admitting: Family Medicine

## 2024-01-16 ENCOUNTER — Encounter (HOSPITAL_COMMUNITY): Payer: Self-pay

## 2024-01-16 DIAGNOSIS — R1011 Right upper quadrant pain: Secondary | ICD-10-CM | POA: Insufficient documentation

## 2024-01-24 ENCOUNTER — Ambulatory Visit (HOSPITAL_COMMUNITY)
Admission: RE | Admit: 2024-01-24 | Discharge: 2024-01-24 | Disposition: A | Discharge status: Home / Self Care | Source: Ambulatory Visit | Attending: Family Medicine | Admitting: Family Medicine

## 2024-01-24 DIAGNOSIS — R1011 Right upper quadrant pain: Secondary | ICD-10-CM | POA: Diagnosis present

## 2024-01-24 MED ORDER — TECHNETIUM TC 99M MEBROFENIN IV KIT
5.0000 | PACK | Freq: Once | INTRAVENOUS | Status: AC
  Administered 2024-01-24: 5.06 via INTRAVENOUS

## 2024-01-27 ENCOUNTER — Ambulatory Visit: Payer: Self-pay | Admitting: Family Medicine

## 2024-01-28 ENCOUNTER — Other Ambulatory Visit (HOSPITAL_COMMUNITY)

## 2024-03-11 ENCOUNTER — Ambulatory Visit: Admitting: Family Medicine

## 2024-03-19 ENCOUNTER — Other Ambulatory Visit: Payer: Self-pay | Admitting: Family Medicine

## 2024-03-19 DIAGNOSIS — E559 Vitamin D deficiency, unspecified: Secondary | ICD-10-CM

## 2024-04-04 ENCOUNTER — Ambulatory Visit: Admitting: Family Medicine

## 2024-04-08 ENCOUNTER — Encounter: Payer: Self-pay | Admitting: Family Medicine

## 2024-04-08 ENCOUNTER — Ambulatory Visit: Admitting: Family Medicine

## 2024-04-08 DIAGNOSIS — R0681 Apnea, not elsewhere classified: Secondary | ICD-10-CM

## 2024-04-08 DIAGNOSIS — F1090 Alcohol use, unspecified, uncomplicated: Secondary | ICD-10-CM | POA: Diagnosis not present

## 2024-04-08 DIAGNOSIS — N926 Irregular menstruation, unspecified: Secondary | ICD-10-CM | POA: Diagnosis not present

## 2024-04-08 LAB — POCT GLYCOSYLATED HEMOGLOBIN (HGB A1C): Hemoglobin A1C: 4.8 % (ref 4.0–5.6)

## 2024-04-08 MED ORDER — PHENTERMINE HCL 15 MG PO CAPS: 15.0000 mg | ORAL_CAPSULE | ORAL | 0 refills | Status: AC

## 2024-04-08 MED ORDER — NALTREXONE HCL 50 MG PO TABS: 50.0000 mg | ORAL_TABLET | Freq: Every day | ORAL | 2 refills | Status: AC

## 2024-04-08 NOTE — Patient Instructions (Addendum)
 [email or web address]  [email or web address]  [email or web address]  Total calories per day: 2000 per day   Total protein per day: 120 gm (shoot for this number)   Total carbs: less than 90 grams   Apps: Loseit!  MyFitnessPal, Carb manager   Once a day multivitamin also.

## 2024-04-08 NOTE — Assessment & Plan Note (Signed)
 Changing the orders to a home sleep study -- if we can get this diagnosis then we could try to get the Zepbound  back for her.SABRASABRASABRA

## 2024-04-08 NOTE — Assessment & Plan Note (Signed)
 Chronic, pt has already started AA which is helping but is hoping to start a medication to help reduce the cravings. I counseled the patient on continuing AA and we will start naltrexone  50 mg daily. I will follow up with her short term to reassess her symptoms, I went over the potenial side effects with her as well.

## 2024-04-08 NOTE — Progress Notes (Signed)
 Established Patient Office Visit  Subjective   Patient ID: [name] [name], female    DOB: [birth year]  Age: 33 y.o. MRN: [id number]  Chief Complaint  Patient presents with   Medical Management of Chronic Issues   Menstrual Problem    Patient complains of irregular cycles x2 months    Pt is here for new complaints today,  Patient is reports that her periods have been irregular for the last 3 months. States that she is bleeding more frequently, about twice a month. States that she bled one day last week and then stopped, now feels like she is going to get her period again. States she has never had problems with her periods in the past, usually are regular, last 3-4 days at a time.   EtOH use-- pt states that she wants to stop drinking alcohol, states that she was drinking about 1/5 th of liquor per day, states that there is a family history of alcohol use, in her uncles and other family members. States that when she took the zepbound  for a couple of months she actually was able to reduce her drinking. States that she started going to AA a couple of weeks ago and she is down to about 1-2 beers per day. States she is having a tough time managing the cravings right now. Is having some symptoms of withdrawal like shaking and HR.  Witnessed apnea-- pt was not able to get to the sleep lab for her sleep study, still having the same symptoms. I counseled her about ordering a home sleep test and she is agreeable.   Morbid obesity -- pt wants therapy to help her lose weight, was not able to stay on the zepbound  due to cost. I counseled the patient on her dietary goals (we went over these last year aswell) and I counseled her on the medications for weight loss, pt wants to try phentermine . Risks/benefits discussed with patient.     Current Outpatient Medications  Medication Instructions   albuterol  (PROVENTIL ) 2.5 mg, Nebulization, Every 6 hours PRN   albuterol  (VENTOLIN  HFA) 108 (90 Base)  MCG/ACT inhaler 1-2 puffs, Inhalation, Every 6 hours PRN   Azelastine -Fluticasone  (DYMISTA ) 137-50 MCG/ACT SUSP 1 spray, Each Nare, 2 times daily PRN   Crisaborole  (EUCRISA ) 2 % OINT 1 application 2 times daily on hands AS Needed to red itchy areas.   dicyclomine  (BENTYL ) 20 mg, Oral, 2 times daily   Dupixent  300 mg, Subcutaneous, Every 14 days   EPINEPHrine  (EPI-PEN) 0.3 mg, Intramuscular, As needed   fluticasone  (FLONASE ) 50 MCG/ACT nasal spray 2 sprays, Each Nare, Daily, Place 2 sprays in each nostril once a day as needed for stuffy nose   fluticasone -salmeterol (WIXELA INHUB) 250-50 MCG/ACT AEPB 1 puff, Inhalation, Daily, For Asthma flares increase to Wixela 250/50 1 puff twice daily to help prevent cough and wheeze. Rinse mouth after each use.   hydrocortisone  2.5 % ointment Topical, 2 times daily, May use on the face up to 7 days at a time.   loratadine  (CLARITIN ) 10 mg, Oral, Daily   naltrexone  (DEPADE) 50 mg, Oral, Daily   ondansetron  (ZOFRAN -ODT) 4 mg, Oral, Every 8 hours PRN   phentermine  15 mg, Oral, BH-each morning   triamcinolone  ointment (KENALOG ) 0.1 % 1 Application, Topical, 2 times daily PRN, Do not use on the face, neck, armpits or groin area.    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     Objective:     BP 112/78   Pulse 73   Temp 98.5 F (36.9 C) (Oral)   Ht 5' 8 (1.727 m)   Wt 274 lb (124.3 kg)   LMP 03/30/2024 (Exact Date)   SpO2 98%   BMI  41.66 kg/m    Physical Exam Vitals reviewed.  Constitutional:      Appearance: Normal appearance. She is morbidly obese.  Eyes:     Conjunctiva/sclera: Conjunctivae normal.  Neck:     Thyroid : No thyromegaly.  Cardiovascular:     Rate and Rhythm: Normal rate and regular rhythm.     Heart sounds: Normal heart sounds. No murmur heard. Pulmonary:     Effort: Pulmonary effort is normal.     Breath sounds: Normal breath sounds. No wheezing.  Abdominal:     Palpations: Abdomen is soft.  Neurological:     Mental Status: She is alert and oriented to person, place, and time. Mental status is at baseline.  Psychiatric:        Mood and Affect: Mood normal.        Behavior: Behavior normal.      Results for orders placed or performed in visit on 04/08/24  HM PAP SMEAR  Result Value Ref Range   HM Pap smear NILM, HPV negative, in care everywhere   POC HgB A1c  Result Value Ref Range   Hemoglobin A1C 4.8 4.0 - 5.6 %   HbA1c POC (<> result, manual entry)     HbA1c, POC (prediabetic range)     HbA1c, POC (controlled diabetic range)        The ASCVD Risk score (Arnett DK, et al., 2019) failed to calculate for the following reasons:   The 2019 ASCVD risk score is only valid for ages 34 to 65    Assessment & Plan:  Morbid obesity (HCC) Assessment & Plan: Counseled pt on her dietary goals and exercise recommendations, will start phentermine  15 mg daily. RTC with video visit in 6 weeks. A1C is negative again today, checking TSH  Orders: -     Phentermine  HCl; Take 1 capsule (15 mg total) by mouth every morning.  Dispense: 30 capsule; Refill: 0  Irregular periods -     TSH; Future -     POCT glycosylated hemoglobin (Hb A1C)  Witnessed episode of apnea Assessment & Plan: Changing the orders to a home sleep study -- if we can get this diagnosis then we could try to get the Zepbound  back for her....  Orders: -     Ambulatory referral to Sleep Studies  Alcohol use  disorder Assessment & Plan: Chronic, pt has already started AA which is helping but is hoping to start a medication to help reduce the cravings. I counseled the patient on continuing AA and we will start naltrexone  50 mg daily. I will follow up with her short term to reassess her symptoms, I went over the potenial side effects with her as well.   Orders: -     Naltrexone  HCl; Take 1 tablet (50 mg total) by mouth  daily.  Dispense: 30 tablet; Refill: 2     Return in about 6 weeks (around 05/20/2024) for video visit for follow up on medications.    Heron CHRISTELLA Sharper, MD

## 2024-04-08 NOTE — Assessment & Plan Note (Signed)
 Counseled pt on her dietary goals and exercise recommendations, will start phentermine  15 mg daily. RTC with video visit in 6 weeks. A1C is negative again today, checking TSH

## 2024-04-09 LAB — TSH: TSH: 1.68 u[IU]/mL (ref 0.35–5.50)

## 2024-04-10 ENCOUNTER — Ambulatory Visit: Payer: Self-pay | Admitting: Family Medicine

## 2024-04-21 ENCOUNTER — Ambulatory Visit (INDEPENDENT_AMBULATORY_CARE_PROVIDER_SITE_OTHER): Admitting: Internal Medicine

## 2024-04-21 ENCOUNTER — Other Ambulatory Visit: Payer: Self-pay

## 2024-04-21 ENCOUNTER — Encounter: Payer: Self-pay | Admitting: Internal Medicine

## 2024-04-21 ENCOUNTER — Telehealth: Payer: Self-pay | Admitting: *Deleted

## 2024-04-21 ENCOUNTER — Ambulatory Visit: Admitting: Internal Medicine

## 2024-04-21 VITALS — BP 122/68 | HR 78 | Temp 97.2°F

## 2024-04-21 DIAGNOSIS — T7800XD Anaphylactic reaction due to unspecified food, subsequent encounter: Secondary | ICD-10-CM | POA: Diagnosis not present

## 2024-04-21 DIAGNOSIS — H1010 Acute atopic conjunctivitis, unspecified eye: Secondary | ICD-10-CM

## 2024-04-21 DIAGNOSIS — J309 Allergic rhinitis, unspecified: Secondary | ICD-10-CM

## 2024-04-21 DIAGNOSIS — J302 Other seasonal allergic rhinitis: Secondary | ICD-10-CM

## 2024-04-21 DIAGNOSIS — Z880 Allergy status to penicillin: Secondary | ICD-10-CM

## 2024-04-21 DIAGNOSIS — J4540 Moderate persistent asthma, uncomplicated: Secondary | ICD-10-CM | POA: Diagnosis not present

## 2024-04-21 DIAGNOSIS — L2089 Other atopic dermatitis: Secondary | ICD-10-CM

## 2024-04-21 DIAGNOSIS — J3089 Other allergic rhinitis: Secondary | ICD-10-CM

## 2024-04-21 MED ORDER — TRIAMCINOLONE ACETONIDE 0.1 % EX OINT: 1.0000 | TOPICAL_OINTMENT | Freq: Two times a day (BID) | CUTANEOUS | 3 refills | Status: AC | PRN

## 2024-04-21 MED ORDER — AZELASTINE-FLUTICASONE 137-50 MCG/ACT NA SUSP: 1.0000 | Freq: Two times a day (BID) | NASAL | 5 refills | Status: AC | PRN

## 2024-04-21 MED ORDER — OPZELURA 1.5 % EX CREA: 1.0000 | TOPICAL_CREAM | Freq: Two times a day (BID) | CUTANEOUS | 1 refills | Status: AC | PRN

## 2024-04-21 MED ORDER — HYDROCORTISONE 2.5 % EX OINT: TOPICAL_OINTMENT | Freq: Two times a day (BID) | CUTANEOUS | 3 refills | Status: AC

## 2024-04-21 MED ORDER — EPINEPHRINE 0.3 MG/0.3ML IJ SOAJ: 0.3000 mg | INTRAMUSCULAR | 1 refills | Status: AC | PRN

## 2024-04-21 MED ORDER — ALBUTEROL SULFATE (2.5 MG/3ML) 0.083% IN NEBU: 2.5000 mg | INHALATION_SOLUTION | Freq: Four times a day (QID) | RESPIRATORY_TRACT | 1 refills | Status: AC | PRN

## 2024-04-21 MED ORDER — ALBUTEROL SULFATE HFA 108 (90 BASE) MCG/ACT IN AERS: 1.0000 | INHALATION_SPRAY | Freq: Four times a day (QID) | RESPIRATORY_TRACT | 1 refills | Status: AC | PRN

## 2024-04-21 MED ORDER — FLUTICASONE-SALMETEROL 250-50 MCG/ACT IN AEPB: 1.0000 | INHALATION_SPRAY | Freq: Every day | RESPIRATORY_TRACT | 5 refills | Status: AC

## 2024-04-21 NOTE — Patient Instructions (Addendum)
 Moderate persistent asthma-well-controlled Daily controller medication(s):  Wixela 250/50 1 puff daily to help prevent cough and wheeze  For Asthma flares: increase to Wixela 250/50 1 puff twice daily to help prevent cough and wheeze May use albuterol  rescue inhaler 2 puffs every 4 to 6 hours as needed for shortness of breath, chest tightness, coughing, and wheezing. May use albuterol  rescue inhaler 2 puffs 5 to 15 minutes prior to strenuous physical activities. Monitor frequency of use.  Asthma control goals:  Full participation in all desired activities (may need albuterol  before activity) Albuterol  use two times or less a week on average (not counting use with activity) Cough interfering with sleep two times or less a month Oral steroids no more than once a year No hospitalizations  Other allergic rhinitis-well-controlled 2020 skin testing showed: Positive to grass, tree, ragweed, weed, dust mites, dog, cat, mold, horse, mouse.   Continue environmental control measures. May use over the counter antihistamines such as Zyrtec (cetirizine), Claritin  (loratadine ), Allegra (fexofenadine), or Xyzal (levocetirizine) daily as needed. Dymista  1 spray twice daily as needed.  Consider allergy  injections   Other atopic dermatitis-well-controlled Continue dupixent  injections every 2 weeks at home.  Continue proper skin care measures.   Hands-not well controlled Try to keep it dry at work. Wear a cotton glove under the rubber glove - get latex free. Use the following for the hands:  May use Eucrisa  (crisaborole ) 2% ointment twice a day on mild eczema flares on the face and body. This is a non-steroid ointment.  If it burns, place the medication in the refrigerator.  Apply a thin layer of moisturizer and then apply the Eucrisa  on top of it. Medications: Only apply to affected areas that are "rough and red" Body:  May use triamcinolone  0.1% ointment twice a day as needed for eczema flares. Do not  use on the face, neck, armpits or groin area. Do not use more than 3 weeks in a row.  For more than twice a day use the following: Aquaphor, Vaseline, Cerave, Cetaphil, Eucerin, Vanicream.  Itching: Continue Claritin  10mg  in the morning as needed.  Trial of opzelura -can use twice daily as needed for hand dermatitis.   Adverse food reaction- Can update shellfish and nut testing at follow-up. Past skin testing showed: peanuts, tree nuts, sesame. Eating and tolerating sesame and shrimp. Continue to avoid peanuts, tree nuts, shellfish. Okay to eat shrimp as before.  For mild symptoms you can take over the counter antihistamines such as Benadryl and monitor symptoms closely. If symptoms worsen or if you have severe symptoms including breathing issues, throat closure, significant swelling, whole body hives, severe diarrhea and vomiting, lightheadedness then inject epinephrine  and seek immediate medical care afterwards. Schedule for crab challenge.  If interested we can schedule food challenge to crabs. You must be off antihistamines for 3-5 days before. Must be in good health and not ill. Plan on being in the office for 2-3 hours and must bring in the food you want to do the oral challenge for. You must call to schedule an appointment and specify it's for a food challenge.   H/O Penicillin allergy : - please schedule follow-up appt at your convenience for penicillin testing followed by graded oral challenge if indicated - please refrain from taking any antihistamines at least 3 days prior to this appointment  - around 80% of individuals outgrow this allergy  in ~ 10 years and carrying it as a diagnosis can prevent you from getting proper therapy if needed  Follow up  6 months, sooner if needed Return sooner for allergy  testing at your convenience (1-55, shellfish, nuts-PN and TN). It was a pleasure seeing you again today!! Thank you for letting me participate in your care.

## 2024-04-21 NOTE — Telephone Encounter (Signed)
 Thanks....we printed one online and filled it out. I wasn't sure if you needed anything signed in office. Copying Mihcelle because she helped us  get the form.

## 2024-04-21 NOTE — Telephone Encounter (Signed)
 Spoke to patient and at this time she has Ins would  have to go through same instead of Dupixent  [employer] Way patient assistance. She advised this coverage will term next weeks o I will mail her the consent to sign and send back to me

## 2024-04-21 NOTE — Telephone Encounter (Signed)
-----   Message from Rocky LOISE Endow sent at 04/21/2024  4:02 PM EDT ----- Hi Tonda Wiederhold-I saw [name] [name] today and she is doing great on Dupixent . She mentioned needing to send her Dupixent  [employer] Way form-the enrollment form. I wasn't sure what she was talking about, so I told her I would reach out to you. Thanks!

## 2024-04-21 NOTE — Progress Notes (Signed)
 FOLLOW UP Date of Service/Encounter:   04/21/2024  Subjective:  [name] [name] (DOB: 04-23-1991) is a 33 y.o. female who [facility] in re-evaluation of the following: asthma, allergic rhinitis, atopic dermatitis on dupixent , food allergies, penicillin allergy  History obtained from: chart review and patient.  For Review, LV was on 09/03/23  with [name] [name] seen for routine follow-up. See below for summary of history and diagnostics.   Therapeutic plans/changes recommended: overall doing well on dupixent , FEV1 94%; did report some ETD, we discussed restarting AIT now that asthma controlled. WE were planning on updating allergy  testing. We did start Dymista  nasal spray. ----------------------------------------------------- Pertinent History/Diagnostics:  Asthma: moderate persistent Current meds: Wixela 250/50 1 puff 1-2 times daily, dupixent  q2 weeks, albuterol  PRN Allergic Rhinitis:  - SPT environmental panel-2020 skin testing showed: Positive to grass, tree, ragweed, weed, dust mites, dog, cat, mold, horse, mouse.   Current meds: Claritin  or Xyzal daily as needed, Flonase  She was previously on allergy  injections which were helpful.  Last received these over 10 years ago. Food Allergy  (peanuts, tree nuts, and shellfish except shrimp)  -Past skin testing showed: peanuts, tree nuts, sesame.  - shellfish: breaks out into hives, asthma flares. but eats shrimp and tolerates, eats, finned fish; interested in crab challenge; 2020 labs were slightly positive for shrimp and below 0.35 threshold for clam, crab, scallop and lobster - peanuts and treenuts: has flare of asthma  - sesame: had to use her inhaler, now eating and tolerating as of June 2024. Eczema/hand dermatitis: controlled on Dupixent , started around 2021; does home dosing Penicillin allergy : told by her mother that she breaks out into hives. Avoids since  childhood. --------------------------------------------------- Today presents for follow-up. Discussed the use of AI scribe software for clinical note transcription with the patient, who gave verbal consent to proceed.  History of Present Illness [name] [name] is a 33 year old female with asthma and eczema who presents for follow-up on her asthma management and skin condition.  Asthma control - Asthma is well-controlled with no recent exacerbations or flare-ups - Rarely requires use of rescue inhaler - No need for systemic corticosteroids or antibiotics since last visit - Current maintenance therapy includes Wixela, one puff daily - Receives Dupixent  injections; last dose administered two weeks ago, with four injections remaining and next dose due this week  Eczematous dermatitis - Eczema symptoms fluctuate, with increased pruritus of the hands over the past few days - Topical cream used for symptom management-triamcinolone ; improved since starting dupixent  but still having frequent flares - Hand dermatitis exacerbated by workplace soap exposure  Allergic rhinitis and antihistamine use - Dymista  nasal spray provides symptomatic relief when used consistently - Takes generic Xyzal (levocetirizine) from [employer] for allergy  management  Food and environmental allergies - Allergies to shellfish and nuts, with no recent accidental exposures - Continues to consume shrimp and sesame, avoids other shellfish - No updated allergy  testing performed yet, interested in resuming allergy  immunotherapy  Ear symptoms - No recent ear problems; symptoms are not currently bothersome   All medications reviewed by clinical staff and updated in chart. No new pertinent medical or surgical history except as noted in HPI.  ROS: All others negative except as noted per HPI.   Objective:  BP 122/68 (BP Location: Left Arm, Patient Position: Sitting, Cuff Size: Large)   Pulse 78   Temp (!) 97.2 F  (36.2 C) (Temporal)   LMP 03/30/2024 (Exact Date)   SpO2 98%  There is no height or weight on file to calculate BMI. Physical Exam: General Appearance:  Alert, cooperative, no distress, appears stated age  Head:  Normocephalic, without obvious abnormality, atraumatic  Eyes:  Conjunctiva clear, EOM's intact  Ears EACs normal bilaterally and normal TMs bilaterally  Nose: Nares normal, hypertrophic turbinates, normal mucosa, and no visible anterior polyps  Throat: Lips, tongue normal; teeth and gums normal, normal posterior oropharynx  Neck: Supple, symmetrical  Lungs:   clear to auscultation bilaterally, Respirations unlabored, no coughing  Heart:  regular rate and rhythm and no murmur, Appears well perfused  Extremities: No edema  Skin: lichenification on bilateral hands with dry erythematous patches scattered on dorsal hands bilaterally  Neurologic: No gross deficits   Labs:  Lab Orders  No laboratory test(s) ordered today    Spirometry:  Tracings reviewed. Her effort: Good reproducible efforts. FVC: 3.82L FEV1: 2.84L, 94% predicted FEV1/FVC ratio: 0.74 Interpretation: Spirometry consistent with normal pattern.  Please see scanned spirometry results for details.   Assessment/Plan   Moderate persistent asthma-well-controlled Daily controller medication(s):  Wixela 250/50 1 puff daily to help prevent cough and wheeze  For Asthma flares: increase to Wixela 250/50 1 puff twice daily to help prevent cough and wheeze May use albuterol  rescue inhaler 2 puffs every 4 to 6 hours as needed for shortness of breath, chest tightness, coughing, and wheezing. May use albuterol  rescue inhaler 2 puffs 5 to 15 minutes prior to strenuous physical activities. Monitor frequency of use.  Asthma control goals:  Full participation in all desired activities (may need albuterol  before activity) Albuterol  use two times or less a week on average (not counting use with activity) Cough interfering with  sleep two times or less a month Oral steroids no more than once a year No hospitalizations Continue dupixent  injections per protocol  Other allergic rhinitis-well-controlled 2020 skin testing showed: Positive to grass, tree, ragweed, weed, dust mites, dog, cat, mold, horse, mouse.   Continue environmental control measures. May use over the counter antihistamines such as Zyrtec (cetirizine), Claritin  (loratadine ), Allegra (fexofenadine), or Xyzal (levocetirizine) daily as needed. Dymista  1 spray twice daily as needed.  Consider allergy  injections   Other atopic dermatitis-well-controlled Continue dupixent  injections every 2 weeks at home.  Continue proper skin care measures.   Hands-not well controlled Try to keep it dry at work. Wear a cotton glove under the rubber glove - get latex free. Use the following for the hands:  May use Eucrisa  (crisaborole ) 2% ointment twice a day on mild eczema flares on the face and body. This is a non-steroid ointment.  If it burns, place the medication in the refrigerator.  Apply a thin layer of moisturizer and then apply the Eucrisa  on top of it. Medications: Only apply to affected areas that are "rough and red" Body:  May use triamcinolone  0.1% ointment twice a day as needed for eczema flares. Do not use on the face, neck, armpits or groin area. Do not use more than 3 weeks in a row.  For more than twice a day use the following: Aquaphor, Vaseline, Cerave, Cetaphil, Eucerin, Vanicream.  Itching: Continue Claritin  10mg  in the morning as needed.  Trial of opzelura -can use twice daily as needed for hand dermatitis.   Adverse food reaction- Can update shellfish and nut testing at follow-up. Past skin testing showed: peanuts, tree nuts, sesame. Eating and tolerating sesame and shrimp. Continue to avoid peanuts, tree nuts, shellfish. Okay to eat shrimp as before.  For mild symptoms you  can take over the counter antihistamines such as Benadryl and  monitor symptoms closely. If symptoms worsen or if you have severe symptoms including breathing issues, throat closure, significant swelling, whole body hives, severe diarrhea and vomiting, lightheadedness then inject epinephrine  and seek immediate medical care afterwards. Schedule for crab challenge.  If interested we can schedule food challenge to crabs. You must be off antihistamines for 3-5 days before. Must be in good health and not ill. Plan on being in the office for 2-3 hours and must bring in the food you want to do the oral challenge for. You must call to schedule an appointment and specify it's for a food challenge.   H/O Penicillin allergy : - please schedule follow-up appt at your convenience for penicillin testing followed by graded oral challenge if indicated - please refrain from taking any antihistamines at least 3 days prior to this appointment  - around 80% of individuals outgrow this allergy  in ~ 10 years and carrying it as a diagnosis can prevent you from getting proper therapy if needed    Follow up  6 months, sooner if needed Return sooner for allergy  testing at your convenience (1-55, shellfish, nuts-PN and TN). It was a pleasure seeing you again today!! Thank you for letting me participate in your care.  Other: forms completed for dupixent  myway enrollment   Rocky Endow, MD  [facility] 

## 2024-05-13 NOTE — Telephone Encounter (Signed)
 I submitted patient to dupixent  my way and they called and advised still shows active coverage even though she does not work there. I advised her to reach out to plan to make sure they term her coverage so she can be accessed for PAP

## 2024-05-16 ENCOUNTER — Telehealth: Payer: Self-pay | Admitting: *Deleted

## 2024-05-16 NOTE — Telephone Encounter (Signed)
 Denied today by [facility] NCPDP 2017 Your PA request has been denied for Opzelura  1.5% cream.

## 2024-05-20 ENCOUNTER — Telehealth: Admitting: Family Medicine

## 2024-06-03 ENCOUNTER — Telehealth: Payer: Self-pay | Admitting: *Deleted

## 2024-06-03 NOTE — Telephone Encounter (Signed)
 Patient called regarding her dupixent . She had reached out to the Ins coverage and they advised same is termed and are sending a letter. I advised when I did verify still coming up active. Advised her to get me there letter so I can forward to Dupixent  [employer] Way to show she doesn't have any Ins coverage.

## 2024-06-14 ENCOUNTER — Other Ambulatory Visit: Payer: Self-pay | Admitting: Family Medicine

## 2024-06-14 DIAGNOSIS — E559 Vitamin D deficiency, unspecified: Secondary | ICD-10-CM

## 2024-08-07 ENCOUNTER — Telehealth: Payer: Self-pay | Admitting: *Deleted

## 2024-08-07 NOTE — Telephone Encounter (Signed)
 Patient called and advised per Dupixent  My way needed to apply MCD and to qualify for free drug. She applied and received MCD will get approval and reach out to advise submit

## 2024-08-11 ENCOUNTER — Other Ambulatory Visit (HOSPITAL_COMMUNITY): Payer: Self-pay

## 2024-08-11 ENCOUNTER — Other Ambulatory Visit: Payer: Self-pay

## 2024-08-11 MED ORDER — DUPIXENT 300 MG/2ML ~~LOC~~ SOSY
300.0000 mg | PREFILLED_SYRINGE | SUBCUTANEOUS | 11 refills | Status: AC
  Filled 2024-08-14: qty 4, 28d supply, fill #0
  Filled 2024-09-08 – 2024-09-12 (×2): qty 4, 28d supply, fill #1

## 2024-08-11 NOTE — Telephone Encounter (Signed)
 Spoke to patient and advised submit to [facility]

## 2024-08-11 NOTE — Telephone Encounter (Signed)
 Tried to reach patient to advise approval for Dupixent  and submit to [facility] now that she has MCD. Unable to leave message voicemail full

## 2024-08-14 ENCOUNTER — Other Ambulatory Visit: Payer: Self-pay

## 2024-08-14 NOTE — Progress Notes (Signed)
 Specialty Pharmacy Initial Fill Coordination Note  [name] [name] is a 33 y.o. female contacted today regarding initial fill of specialty medication(s) Dupilumab  (Dupixent )   Patient requested Delivery   Delivery date: 08/19/24   Verified address: [address]  [place] [address]   Medication will be filled on: 08/18/24   Patient is aware of $4.00 copayment.

## 2024-08-14 NOTE — Progress Notes (Signed)
 Specialty Pharmacy Initiation Note   [name] [name] is a 33 y.o. female who will be followed by the specialty pharmacy service for RxSp Atopic Dermatitis    Review of administration, indication, effectiveness, safety, potential side effects, storage/disposable, and missed dose instructions occurred today for patient's specialty medication(s) Dupilumab  (Dupixent )     Patient/Caregiver did not have any additional questions or concerns.   Patient's therapy is appropriate to: Continue (Patient established on therapy, transferring from manufacturer assistance.)    Goals Addressed             This Visit's Progress    Reduce signs and symptoms       Patient is on track. Patient will maintain adherence. Patient established on therapy, transferring from manufacturer assistance.          Joangel Vanosdol M Elsa Ploch Specialty Pharmacist

## 2024-09-08 ENCOUNTER — Other Ambulatory Visit: Payer: Self-pay

## 2024-09-09 ENCOUNTER — Other Ambulatory Visit: Payer: Self-pay

## 2024-09-10 ENCOUNTER — Other Ambulatory Visit: Payer: Self-pay

## 2024-09-12 ENCOUNTER — Other Ambulatory Visit: Payer: Self-pay

## 2024-09-12 ENCOUNTER — Other Ambulatory Visit (HOSPITAL_COMMUNITY): Payer: Self-pay

## 2024-09-12 NOTE — Progress Notes (Signed)
 Specialty Pharmacy Refill Coordination Note  [name] [name] is a 34 y.o. female contacted today regarding refills of specialty medication(s) Dupilumab  (Dupixent )   Patient requested Delivery   Delivery date: 09/17/24   Verified address: [address]  [place] [address]   Medication will be filled on: 09/16/24

## 2024-09-16 ENCOUNTER — Other Ambulatory Visit: Payer: Self-pay

## 2024-10-27 ENCOUNTER — Ambulatory Visit: Admitting: Internal Medicine
